# Patient Record
Sex: Female | Born: 1935 | Race: White | Hispanic: No | Smoking: Never smoker
Health system: Southern US, Community
[De-identification: ages and names within clinical notes are randomized; demographics above are authoritative.]

## PROBLEM LIST (undated history)

## (undated) DIAGNOSIS — C801 Malignant (primary) neoplasm, unspecified: Secondary | ICD-10-CM

## (undated) DIAGNOSIS — E119 Type 2 diabetes mellitus without complications: Secondary | ICD-10-CM

## (undated) DIAGNOSIS — H269 Unspecified cataract: Secondary | ICD-10-CM

## (undated) DIAGNOSIS — F419 Anxiety disorder, unspecified: Secondary | ICD-10-CM

## (undated) DIAGNOSIS — N200 Calculus of kidney: Secondary | ICD-10-CM

## (undated) DIAGNOSIS — E559 Vitamin D deficiency, unspecified: Secondary | ICD-10-CM

## (undated) DIAGNOSIS — G709 Myoneural disorder, unspecified: Secondary | ICD-10-CM

## (undated) DIAGNOSIS — M8580 Other specified disorders of bone density and structure, unspecified site: Secondary | ICD-10-CM

## (undated) DIAGNOSIS — K219 Gastro-esophageal reflux disease without esophagitis: Secondary | ICD-10-CM

## (undated) DIAGNOSIS — R42 Dizziness and giddiness: Secondary | ICD-10-CM

## (undated) DIAGNOSIS — I10 Essential (primary) hypertension: Secondary | ICD-10-CM

## (undated) DIAGNOSIS — N183 Chronic kidney disease, stage 3 unspecified: Secondary | ICD-10-CM

## (undated) DIAGNOSIS — G20A1 Parkinson's disease without dyskinesia, without mention of fluctuations: Secondary | ICD-10-CM

## (undated) DIAGNOSIS — E7800 Pure hypercholesterolemia, unspecified: Secondary | ICD-10-CM

## (undated) HISTORY — DX: Vitamin D deficiency, unspecified: E55.9

## (undated) HISTORY — DX: Unspecified cataract: H26.9

## (undated) HISTORY — DX: Other specified disorders of bone density and structure, unspecified site: M85.80

## (undated) HISTORY — DX: Pure hypercholesterolemia, unspecified: E78.00

## (undated) HISTORY — DX: Calculus of kidney: N20.0

## (undated) HISTORY — DX: Essential (primary) hypertension: I10

## (undated) HISTORY — PX: OTHER SURGICAL HISTORY: SHX169

## (undated) HISTORY — DX: Gastro-esophageal reflux disease without esophagitis: K21.9

## (undated) HISTORY — DX: Myoneural disorder, unspecified: G70.9

## (undated) HISTORY — DX: Anxiety disorder, unspecified: F41.9

---

## 2004-07-30 ENCOUNTER — Ambulatory Visit: Payer: Self-pay | Admitting: Internal Medicine

## 2005-07-27 ENCOUNTER — Ambulatory Visit: Payer: Self-pay | Admitting: Internal Medicine

## 2005-08-03 ENCOUNTER — Ambulatory Visit: Payer: Self-pay | Admitting: Internal Medicine

## 2006-08-04 ENCOUNTER — Ambulatory Visit: Payer: Self-pay | Admitting: Internal Medicine

## 2007-06-17 ENCOUNTER — Ambulatory Visit: Payer: Self-pay | Admitting: Gastroenterology

## 2007-09-07 ENCOUNTER — Ambulatory Visit: Payer: Self-pay | Admitting: Internal Medicine

## 2008-09-11 ENCOUNTER — Ambulatory Visit: Payer: Self-pay | Admitting: Internal Medicine

## 2009-09-16 ENCOUNTER — Ambulatory Visit: Payer: Self-pay | Admitting: Internal Medicine

## 2010-09-17 ENCOUNTER — Ambulatory Visit: Payer: Self-pay | Admitting: Internal Medicine

## 2011-09-21 ENCOUNTER — Ambulatory Visit: Payer: Self-pay | Admitting: Internal Medicine

## 2011-10-27 DIAGNOSIS — R7989 Other specified abnormal findings of blood chemistry: Secondary | ICD-10-CM | POA: Diagnosis not present

## 2011-10-27 DIAGNOSIS — E559 Vitamin D deficiency, unspecified: Secondary | ICD-10-CM | POA: Diagnosis not present

## 2011-10-27 DIAGNOSIS — I10 Essential (primary) hypertension: Secondary | ICD-10-CM | POA: Diagnosis not present

## 2011-10-27 DIAGNOSIS — E7800 Pure hypercholesterolemia, unspecified: Secondary | ICD-10-CM | POA: Diagnosis not present

## 2011-10-27 DIAGNOSIS — N390 Urinary tract infection, site not specified: Secondary | ICD-10-CM | POA: Diagnosis not present

## 2011-11-10 DIAGNOSIS — R7309 Other abnormal glucose: Secondary | ICD-10-CM | POA: Diagnosis not present

## 2011-11-10 DIAGNOSIS — E559 Vitamin D deficiency, unspecified: Secondary | ICD-10-CM | POA: Diagnosis not present

## 2011-11-10 DIAGNOSIS — Z124 Encounter for screening for malignant neoplasm of cervix: Secondary | ICD-10-CM | POA: Diagnosis not present

## 2011-11-10 DIAGNOSIS — E7800 Pure hypercholesterolemia, unspecified: Secondary | ICD-10-CM | POA: Diagnosis not present

## 2011-11-10 DIAGNOSIS — I10 Essential (primary) hypertension: Secondary | ICD-10-CM | POA: Diagnosis not present

## 2011-11-19 DIAGNOSIS — M899 Disorder of bone, unspecified: Secondary | ICD-10-CM | POA: Diagnosis not present

## 2011-11-19 DIAGNOSIS — M949 Disorder of cartilage, unspecified: Secondary | ICD-10-CM | POA: Diagnosis not present

## 2012-07-25 DIAGNOSIS — Z23 Encounter for immunization: Secondary | ICD-10-CM | POA: Diagnosis not present

## 2012-08-19 ENCOUNTER — Telehealth: Payer: Self-pay | Admitting: Internal Medicine

## 2012-08-19 DIAGNOSIS — Z139 Encounter for screening, unspecified: Secondary | ICD-10-CM

## 2012-08-19 NOTE — Telephone Encounter (Signed)
i placed order for mammo.  i am not sure when she had her last.  i have not seen her here yet.  Thanks.

## 2012-08-19 NOTE — Telephone Encounter (Signed)
Patient needing an order for a screening mammogram.

## 2012-08-22 ENCOUNTER — Telehealth: Payer: Self-pay | Admitting: Internal Medicine

## 2012-08-22 NOTE — Telephone Encounter (Addendum)
Refill on Amlodipine 5 mg tab takes 2 a day [facility]

## 2012-08-23 MED ORDER — AMLODIPINE BESYLATE 5 MG PO TABS: 5.0000 mg | ORAL_TABLET | Freq: Two times a day (BID) | ORAL | Status: DC

## 2012-08-23 NOTE — Telephone Encounter (Signed)
Sent in to pharmacy.  

## 2012-09-29 ENCOUNTER — Ambulatory Visit: Payer: Self-pay | Admitting: Internal Medicine

## 2012-09-29 DIAGNOSIS — Z1231 Encounter for screening mammogram for malignant neoplasm of breast: Secondary | ICD-10-CM | POA: Diagnosis not present

## 2012-09-29 LAB — HM MAMMOGRAPHY

## 2012-10-10 ENCOUNTER — Telehealth: Payer: Self-pay | Admitting: Internal Medicine

## 2012-10-10 NOTE — Telephone Encounter (Signed)
Need a refill on her  Amlodipine 5 mg , patient takes this medication twice a day and she is running out , she stated that she must have gotten just 30 pills.

## 2012-10-12 NOTE — Telephone Encounter (Signed)
Called patient no answer.

## 2012-10-14 NOTE — Telephone Encounter (Signed)
Patients script corrected and she has an appointment for Monday 13,2014

## 2012-10-14 NOTE — Telephone Encounter (Signed)
Called patient no answer.

## 2012-10-16 ENCOUNTER — Encounter: Payer: Self-pay | Admitting: Internal Medicine

## 2012-10-16 DIAGNOSIS — I10 Essential (primary) hypertension: Secondary | ICD-10-CM | POA: Insufficient documentation

## 2012-10-16 DIAGNOSIS — E7800 Pure hypercholesterolemia, unspecified: Secondary | ICD-10-CM | POA: Insufficient documentation

## 2012-10-16 DIAGNOSIS — K5790 Diverticulosis of intestine, part unspecified, without perforation or abscess without bleeding: Secondary | ICD-10-CM

## 2012-10-16 DIAGNOSIS — M8580 Other specified disorders of bone density and structure, unspecified site: Secondary | ICD-10-CM | POA: Insufficient documentation

## 2012-10-17 ENCOUNTER — Ambulatory Visit (INDEPENDENT_AMBULATORY_CARE_PROVIDER_SITE_OTHER): Payer: Medicare Other | Admitting: Internal Medicine

## 2012-10-17 ENCOUNTER — Encounter: Payer: Self-pay | Admitting: Internal Medicine

## 2012-10-17 VITALS — BP 127/75 | HR 55 | Temp 97.5°F | Ht 62.0 in | Wt 144.0 lb

## 2012-10-17 DIAGNOSIS — M8580 Other specified disorders of bone density and structure, unspecified site: Secondary | ICD-10-CM

## 2012-10-17 DIAGNOSIS — E559 Vitamin D deficiency, unspecified: Secondary | ICD-10-CM | POA: Diagnosis not present

## 2012-10-17 DIAGNOSIS — E7800 Pure hypercholesterolemia, unspecified: Secondary | ICD-10-CM | POA: Diagnosis not present

## 2012-10-17 DIAGNOSIS — M899 Disorder of bone, unspecified: Secondary | ICD-10-CM | POA: Diagnosis not present

## 2012-10-17 DIAGNOSIS — I10 Essential (primary) hypertension: Secondary | ICD-10-CM | POA: Diagnosis not present

## 2012-10-17 DIAGNOSIS — R7309 Other abnormal glucose: Secondary | ICD-10-CM | POA: Diagnosis not present

## 2012-10-17 DIAGNOSIS — R5381 Other malaise: Secondary | ICD-10-CM | POA: Diagnosis not present

## 2012-10-17 DIAGNOSIS — R739 Hyperglycemia, unspecified: Secondary | ICD-10-CM

## 2012-10-17 DIAGNOSIS — R5383 Other fatigue: Secondary | ICD-10-CM

## 2012-10-17 DIAGNOSIS — M949 Disorder of cartilage, unspecified: Secondary | ICD-10-CM | POA: Diagnosis not present

## 2012-10-17 LAB — CBC WITH DIFFERENTIAL/PLATELET
Basophils Relative: 0.7 % (ref 0.0–3.0)
Eosinophils Relative: 6.3 % — ABNORMAL HIGH (ref 0.0–5.0)
Lymphocytes Relative: 33 % (ref 12.0–46.0)
MCV: 93.3 fl (ref 78.0–100.0)
Monocytes Absolute: 0.7 10*3/uL (ref 0.1–1.0)
Neutrophils Relative %: 50.8 % (ref 43.0–77.0)
Platelets: 231 10*3/uL (ref 150.0–400.0)
RBC: 4.41 Mil/uL (ref 3.87–5.11)
WBC: 7.3 10*3/uL (ref 4.5–10.5)

## 2012-10-17 LAB — COMPREHENSIVE METABOLIC PANEL
AST: 17 U/L (ref 0–37)
Albumin: 3.7 g/dL (ref 3.5–5.2)
Alkaline Phosphatase: 55 U/L (ref 39–117)
Calcium: 9 mg/dL (ref 8.4–10.5)
Chloride: 107 mEq/L (ref 96–112)
Glucose, Bld: 120 mg/dL — ABNORMAL HIGH (ref 70–99)
Potassium: 4.1 mEq/L (ref 3.5–5.1)
Sodium: 139 mEq/L (ref 135–145)
Total Protein: 7.1 g/dL (ref 6.0–8.3)

## 2012-10-17 LAB — HEMOGLOBIN A1C: Hgb A1c MFr Bld: 6.2 % (ref 4.6–6.5)

## 2012-10-17 LAB — TSH: TSH: 3.52 u[IU]/mL (ref 0.35–5.50)

## 2012-10-17 LAB — LIPID PANEL
LDL Cholesterol: 120 mg/dL — ABNORMAL HIGH (ref 0–99)
VLDL: 23.6 mg/dL (ref 0.0–40.0)

## 2012-10-17 MED ORDER — ESTROGENS CONJUGATED 0.3 MG PO TABS: 0.3000 mg | ORAL_TABLET | Freq: Every day | ORAL | Status: DC

## 2012-10-17 MED ORDER — AMLODIPINE BESYLATE 5 MG PO TABS: 5.0000 mg | ORAL_TABLET | Freq: Two times a day (BID) | ORAL | Status: DC

## 2012-10-17 MED ORDER — ATENOLOL 100 MG PO TABS: 100.0000 mg | ORAL_TABLET | Freq: Every day | ORAL | Status: DC

## 2012-10-17 MED ORDER — LORAZEPAM 0.5 MG PO TABS: ORAL_TABLET | ORAL | Status: DC

## 2012-10-17 MED ORDER — PROGESTERONE MICRONIZED 100 MG PO CAPS: 100.0000 mg | ORAL_CAPSULE | Freq: Every day | ORAL | Status: DC

## 2012-10-17 NOTE — Progress Notes (Signed)
  Subjective:    Patient ID: Kara Dixon, female    DOB: [birth year], 77 y.o.   MRN: [id number]  HPI 77 year old female with past history of hypertension and hypercholesterolemia who comes in today for a scheduled follow up.  States she got her flu shot.  Discussed pneumonia shot.  Will get it on return appt.  She is planning to go to the [place] this weekend.   Had questions about sea sickness.  She is trying to start tapering the estrogen.  She is taking it every day except Wednesday.  Plans to try to taper more.  She also was questioning - being lactose intolerant.   She is taking a dairy digestive supplement and using lactose free milk.  States she is doing better since making this change.  Has some arthritis.  Notices more in her fingers, left hand and right hip.   Past Medical History  Diagnosis Date  . Hypertension   . Hypercholesterolemia   . Nephrolithiasis   . Osteopenia   . Vitamin D deficiency     Current Outpatient Prescriptions on File Prior to Visit  Medication Sig Dispense Refill  . amLODipine (NORVASC) 5 MG tablet Take 1 tablet (5 mg total) by mouth 2 (two) times daily.  60 tablet  6  . aspirin 81 MG tablet Take 81 mg by mouth daily.      Marland Kitchen atenolol (TENORMIN) 100 MG tablet Take 1 tablet (100 mg total) by mouth daily.  30 tablet  6  . estrogens, conjugated, (PREMARIN) 0.3 MG tablet Take 1 tablet (0.3 mg total) by mouth daily.  30 tablet  5  . fexofenadine (ALLEGRA) 180 MG tablet Take 180 mg by mouth daily.      . progesterone (PROMETRIUM) 100 MG capsule Take 1 capsule (100 mg total) by mouth daily.  30 capsule  6    Review of Systems Patient denies any headache, lightheadedness or dizziness.  No sinus or allergy symptoms.  No chest pain, tightness or palpitations.  No increased shortness of breath, cough or congestion.  No nausea or vomiting.  No acid reflux. No abdominal pain or cramping.  No bowel change, such as diarrhea, constipation, BRBPR or melana.  No urine change.   Overall she feels she is doing well.      Objective:   Physical Exam Filed Vitals:   10/17/12 0826  BP: 127/75  Pulse: 55  Temp: 97.5 F (45.94 C)   76 year old female in no acute distress.   HEENT:  Nares - clear.  OP- without lesions or erythema.  NECK:  Supple, nontender.  No audible bruit.   HEART:  Appears to be regular. LUNGS:  Without crackles or wheezing audible.  Respirations even and unlabored.   RADIAL PULSE:  Equal bilaterally.  ABDOMEN:  Soft, nontender.  No audible abdominal bruit.   EXTREMITIES:  No increased edema to be present.                   Assessment & Plan:  QUESTION OF LACTOSE INTOLERANCE.  She is doing better on her current regimen.  Follow.    MSK.  Some "arthritis".  Will use tylenol arthritis.  Desires no further intervention.   GYN.  Tapering estrogen slowly.  Continue.    HEALTH MAINTENANCE.  Physical 11/10/11.  Mammogram 09/21/11 - BiRADS II.  Colonoscopy 06/20/07 normal.

## 2012-10-18 LAB — VITAMIN D 25 HYDROXY (VIT D DEFICIENCY, FRACTURES): Vit D, 25-Hydroxy: 25 ng/mL — ABNORMAL LOW (ref 30–89)

## 2012-10-19 ENCOUNTER — Other Ambulatory Visit: Payer: Self-pay | Admitting: Internal Medicine

## 2012-10-19 ENCOUNTER — Telehealth: Payer: Self-pay | Admitting: Internal Medicine

## 2012-10-19 NOTE — Telephone Encounter (Signed)
Vitamin d added to med list

## 2012-10-19 NOTE — Progress Notes (Signed)
Opened in error

## 2012-10-23 ENCOUNTER — Encounter: Payer: Self-pay | Admitting: Internal Medicine

## 2012-10-23 NOTE — Assessment & Plan Note (Signed)
On estrogen.  Continue calcium and vitamin D.  Check vitamin D.

## 2012-10-23 NOTE — Assessment & Plan Note (Signed)
Low cholesterol diet and exercise.  Check lipid panel.   

## 2012-10-23 NOTE — Assessment & Plan Note (Signed)
Check vitamin D level 

## 2012-10-23 NOTE — Assessment & Plan Note (Signed)
Blood pressure under good control.  Follow metabolic panel.   

## 2012-10-29 ENCOUNTER — Other Ambulatory Visit: Payer: Self-pay | Admitting: Internal Medicine

## 2012-10-31 NOTE — Telephone Encounter (Signed)
Sent in to pharmacy.  

## 2012-11-10 ENCOUNTER — Telehealth: Payer: Self-pay | Admitting: Internal Medicine

## 2012-11-10 NOTE — Telephone Encounter (Signed)
Pt states she is returning a call.  Pt refuses to leave msg on voicemail.  Pt states she does not need to speak to a nurse, she needs to speak to the doctor because she needs Rx and a nurse can't write her Rx... Please call pt.

## 2012-11-10 NOTE — Telephone Encounter (Signed)
Need info

## 2012-11-10 NOTE — Telephone Encounter (Signed)
Patient wanting a call back about a prescription change.

## 2012-11-10 NOTE — Telephone Encounter (Signed)
Patient stated that she went to get script filled ([facility]), [facility] is now $94.00. She called insurance co they told her that she can switch to [place] with a cop-pay of $43.00 at [facility] or estropipate with co-pay of $7.00 at [facility], [facility]. Patient wanted to know if either one of the other meds would work, especially estropipate? Patient stated that she only has one pill left.

## 2012-11-11 NOTE — Telephone Encounter (Signed)
I do not usually use this form of estrogen.  To my knowledge there is not an estrogen that is a generic equivalent to her premarin.

## 2012-11-11 NOTE — Telephone Encounter (Signed)
Called patient back no answer, will try again later.

## 2012-11-14 NOTE — Telephone Encounter (Signed)
Patient called back and was given information.

## 2013-01-05 ENCOUNTER — Encounter: Payer: Self-pay | Admitting: Internal Medicine

## 2013-01-05 ENCOUNTER — Ambulatory Visit (INDEPENDENT_AMBULATORY_CARE_PROVIDER_SITE_OTHER): Payer: Medicare Other | Admitting: Internal Medicine

## 2013-01-05 ENCOUNTER — Other Ambulatory Visit (HOSPITAL_COMMUNITY)
Admission: RE | Admit: 2013-01-05 | Discharge: 2013-01-05 | Disposition: A | Discharge status: Home / Self Care | Payer: Medicare Other | Source: Ambulatory Visit | Attending: Internal Medicine | Admitting: Internal Medicine

## 2013-01-05 VITALS — BP 140/64 | HR 57 | Temp 97.9°F | Ht 62.0 in | Wt 143.5 lb

## 2013-01-05 DIAGNOSIS — M8580 Other specified disorders of bone density and structure, unspecified site: Secondary | ICD-10-CM

## 2013-01-05 DIAGNOSIS — Z1151 Encounter for screening for human papillomavirus (HPV): Secondary | ICD-10-CM | POA: Insufficient documentation

## 2013-01-05 DIAGNOSIS — R195 Other fecal abnormalities: Secondary | ICD-10-CM

## 2013-01-05 DIAGNOSIS — E7800 Pure hypercholesterolemia, unspecified: Secondary | ICD-10-CM

## 2013-01-05 DIAGNOSIS — I10 Essential (primary) hypertension: Secondary | ICD-10-CM | POA: Diagnosis not present

## 2013-01-05 DIAGNOSIS — R7309 Other abnormal glucose: Secondary | ICD-10-CM

## 2013-01-05 DIAGNOSIS — M899 Disorder of bone, unspecified: Secondary | ICD-10-CM

## 2013-01-05 DIAGNOSIS — Z124 Encounter for screening for malignant neoplasm of cervix: Secondary | ICD-10-CM | POA: Insufficient documentation

## 2013-01-05 DIAGNOSIS — Z1211 Encounter for screening for malignant neoplasm of colon: Secondary | ICD-10-CM

## 2013-01-05 DIAGNOSIS — E559 Vitamin D deficiency, unspecified: Secondary | ICD-10-CM | POA: Diagnosis not present

## 2013-01-05 DIAGNOSIS — R85611 Atypical squamous cells cannot exclude high grade squamous intraepithelial lesion on cytologic smear of anus (ASC-H): Secondary | ICD-10-CM

## 2013-01-05 DIAGNOSIS — R739 Hyperglycemia, unspecified: Secondary | ICD-10-CM

## 2013-01-08 ENCOUNTER — Encounter: Payer: Self-pay | Admitting: Internal Medicine

## 2013-01-08 DIAGNOSIS — E119 Type 2 diabetes mellitus without complications: Secondary | ICD-10-CM | POA: Insufficient documentation

## 2013-01-08 NOTE — Assessment & Plan Note (Signed)
Low cholesterol diet and exercise.  Check lipid panel.   

## 2013-01-08 NOTE — Assessment & Plan Note (Signed)
Blood pressure under good control.  Follow metabolic panel.   

## 2013-01-08 NOTE — Assessment & Plan Note (Signed)
Low carb diet.  A1c checked 1/14 - 6.2.  Follow.

## 2013-01-08 NOTE — Assessment & Plan Note (Signed)
Check vitamin D level 

## 2013-01-08 NOTE — Progress Notes (Signed)
Subjective:    Patient ID: Kara Dixon, female    DOB: [birth year], 77 y.o.   MRN: [id number]  HPI 77 year old female with past history of hypertension and hypercholesterolemia who comes in today to follow up on these issues as well as for a complete physical exam.  She is trying to taper the estrogen.  She is taking it every other day now.  She also feels she may be lactose intolerant.   She is taking a dairy digestive supplement and using lactose free milk.  States she is doing better since making this change.  Is having some night sweats, but manageable.  She has noticed a little bright red blood on the tissue.   No straining.     Past Medical History  Diagnosis Date  . Hypertension   . Hypercholesterolemia   . Nephrolithiasis   . Osteopenia   . Vitamin D deficiency     Current Outpatient Prescriptions on File Prior to Visit  Medication Sig Dispense Refill  . amLODipine (NORVASC) 5 MG tablet Take 1 tablet (5 mg total) by mouth 2 (two) times daily.  60 tablet  6  . aspirin 81 MG tablet Take 81 mg by mouth daily.      Marland Kitchen atenolol (TENORMIN) 100 MG tablet TAKE 1 TABLET BY MOUTH ONCE A DAY  30 tablet  5  . estrogens, conjugated, (PREMARIN) 0.3 MG tablet Take 1 tablet (0.3 mg total) by mouth daily.  30 tablet  5  . fexofenadine (ALLEGRA) 180 MG tablet Take 180 mg by mouth daily.      Marland Kitchen LORazepam (ATIVAN) 0.5 MG tablet 1/2 tablet q hs prn  30 tablet  0  . progesterone (PROMETRIUM) 100 MG capsule Take 1 capsule (100 mg total) by mouth daily.  30 capsule  6  . Cholecalciferol (VITAMIN D-3) 1000 UNITS CAPS Take 2 capsules by mouth daily.       No current facility-administered medications on file prior to visit.    Review of Systems Patient denies any headache, lightheadedness or dizziness.  No sinus or allergy symptoms.  No chest pain, tightness or palpitations.  No increased shortness of breath, cough or congestion.  No nausea or vomiting.  No acid reflux. No abdominal pain or cramping.   No bowel change, such as diarrhea, constipation, or melana.  Has noticed a little BRB on the tissue at times - with wiping.  No urine change.  Overall she feels she is doing well.  Handling stress well.       Objective:   Physical Exam  Filed Vitals:   01/05/13 0828  BP: 140/64  Pulse: 57  Temp: 97.9 F (36.6 C)   Blood pressure recheck:  124/70, pulse 34  77 year old female in no acute distress.   HEENT:  Nares- clear.  Oropharynx - without lesions. NECK:  Supple.  Nontender.  No audible bruit.  HEART:  Appears to be regular. LUNGS:  No crackles or wheezing audible.  Respirations even and unlabored.  RADIAL PULSE:  Equal bilaterally.    BREASTS:  No nipple discharge or nipple retraction present.  Could not appreciate any distinct nodules or axillary adenopathy.  ABDOMEN:  Soft, nontender.  Bowel sounds present and normal.  No audible abdominal bruit.  GU:  Normal external genitalia.  Vaginal vault without lesions.  Cervix identified.  Pap performed. Could not appreciate any adnexal masses or tenderness.   RECTAL:  Heme positive.  EXTREMITIES:  No increased edema present.  DP  pulses palpable and equal bilaterally.          Assessment & Plan:  QUESTION OF LACTOSE INTOLERANCE.  She is doing better on her current regimen.  Follow.    GYN.  Tapering estrogen slowly.  Continue.    HEME POSITIVE.  Last colonoscopy 2008.  BRB as outlined.  Heme positive today.  Will refer back to GI for question of repeat colonoscopy.  Consider annusol HC suppositories.    HEALTH MAINTENANCE.  Physical today.  Mammogram 09/29/12 - BiRADS II.  Colonoscopy 06/20/07 normal.

## 2013-01-08 NOTE — Assessment & Plan Note (Signed)
On estrogen.  Continue calcium and vitamin D.  Check vitamin D.

## 2013-01-10 ENCOUNTER — Encounter: Payer: Self-pay | Admitting: Internal Medicine

## 2013-02-06 DIAGNOSIS — R195 Other fecal abnormalities: Secondary | ICD-10-CM | POA: Diagnosis not present

## 2013-03-02 ENCOUNTER — Ambulatory Visit: Payer: Self-pay | Admitting: Gastroenterology

## 2013-03-02 DIAGNOSIS — Z7982 Long term (current) use of aspirin: Secondary | ICD-10-CM | POA: Diagnosis not present

## 2013-03-02 DIAGNOSIS — E7800 Pure hypercholesterolemia, unspecified: Secondary | ICD-10-CM | POA: Diagnosis not present

## 2013-03-02 DIAGNOSIS — K644 Residual hemorrhoidal skin tags: Secondary | ICD-10-CM | POA: Diagnosis not present

## 2013-03-02 DIAGNOSIS — I10 Essential (primary) hypertension: Secondary | ICD-10-CM | POA: Diagnosis not present

## 2013-03-02 DIAGNOSIS — Z882 Allergy status to sulfonamides status: Secondary | ICD-10-CM | POA: Diagnosis not present

## 2013-03-02 DIAGNOSIS — Z888 Allergy status to other drugs, medicaments and biological substances status: Secondary | ICD-10-CM | POA: Diagnosis not present

## 2013-03-02 DIAGNOSIS — K649 Unspecified hemorrhoids: Secondary | ICD-10-CM | POA: Diagnosis not present

## 2013-03-02 DIAGNOSIS — Z79899 Other long term (current) drug therapy: Secondary | ICD-10-CM | POA: Diagnosis not present

## 2013-03-02 DIAGNOSIS — E559 Vitamin D deficiency, unspecified: Secondary | ICD-10-CM | POA: Diagnosis not present

## 2013-03-02 DIAGNOSIS — Z880 Allergy status to penicillin: Secondary | ICD-10-CM | POA: Diagnosis not present

## 2013-03-02 DIAGNOSIS — K5730 Diverticulosis of large intestine without perforation or abscess without bleeding: Secondary | ICD-10-CM | POA: Diagnosis not present

## 2013-03-02 DIAGNOSIS — E785 Hyperlipidemia, unspecified: Secondary | ICD-10-CM | POA: Diagnosis not present

## 2013-03-02 DIAGNOSIS — R195 Other fecal abnormalities: Secondary | ICD-10-CM | POA: Diagnosis not present

## 2013-03-16 DIAGNOSIS — K5790 Diverticulosis of intestine, part unspecified, without perforation or abscess without bleeding: Secondary | ICD-10-CM | POA: Insufficient documentation

## 2013-04-20 ENCOUNTER — Encounter: Payer: Self-pay | Admitting: Internal Medicine

## 2013-05-06 ENCOUNTER — Other Ambulatory Visit: Payer: Self-pay | Admitting: Internal Medicine

## 2013-05-12 ENCOUNTER — Ambulatory Visit (INDEPENDENT_AMBULATORY_CARE_PROVIDER_SITE_OTHER): Payer: Medicare Other | Admitting: Internal Medicine

## 2013-05-12 ENCOUNTER — Encounter: Payer: Self-pay | Admitting: Internal Medicine

## 2013-05-12 VITALS — BP 110/72 | HR 56 | Temp 97.9°F | Ht 62.0 in | Wt 143.5 lb

## 2013-05-12 DIAGNOSIS — R7309 Other abnormal glucose: Secondary | ICD-10-CM

## 2013-05-12 DIAGNOSIS — E7800 Pure hypercholesterolemia, unspecified: Secondary | ICD-10-CM

## 2013-05-12 DIAGNOSIS — K5790 Diverticulosis of intestine, part unspecified, without perforation or abscess without bleeding: Secondary | ICD-10-CM

## 2013-05-12 DIAGNOSIS — I10 Essential (primary) hypertension: Secondary | ICD-10-CM

## 2013-05-12 DIAGNOSIS — M899 Disorder of bone, unspecified: Secondary | ICD-10-CM

## 2013-05-12 DIAGNOSIS — M8580 Other specified disorders of bone density and structure, unspecified site: Secondary | ICD-10-CM

## 2013-05-12 DIAGNOSIS — E559 Vitamin D deficiency, unspecified: Secondary | ICD-10-CM

## 2013-05-12 DIAGNOSIS — R195 Other fecal abnormalities: Secondary | ICD-10-CM

## 2013-05-12 DIAGNOSIS — R739 Hyperglycemia, unspecified: Secondary | ICD-10-CM

## 2013-05-12 DIAGNOSIS — K5730 Diverticulosis of large intestine without perforation or abscess without bleeding: Secondary | ICD-10-CM

## 2013-05-12 LAB — BASIC METABOLIC PANEL
BUN: 12 mg/dL (ref 6–23)
CO2: 25 mEq/L (ref 19–32)
Chloride: 104 mEq/L (ref 96–112)
Potassium: 4.4 mEq/L (ref 3.5–5.1)

## 2013-05-12 LAB — CBC WITH DIFFERENTIAL/PLATELET
Basophils Absolute: 0.1 10*3/uL (ref 0.0–0.1)
HCT: 38.9 % (ref 36.0–46.0)
Hemoglobin: 13 g/dL (ref 12.0–15.0)
Lymphs Abs: 2.9 10*3/uL (ref 0.7–4.0)
MCHC: 33.5 g/dL (ref 30.0–36.0)
MCV: 88.3 fl (ref 78.0–100.0)
Monocytes Absolute: 0.7 10*3/uL (ref 0.1–1.0)
Monocytes Relative: 7.6 % (ref 3.0–12.0)
Neutro Abs: 4.6 10*3/uL (ref 1.4–7.7)
Platelets: 276 10*3/uL (ref 150.0–400.0)
RDW: 15 % — ABNORMAL HIGH (ref 11.5–14.6)

## 2013-05-12 LAB — HEMOGLOBIN A1C: Hgb A1c MFr Bld: 6.6 % — ABNORMAL HIGH (ref 4.6–6.5)

## 2013-05-12 LAB — LDL CHOLESTEROL, DIRECT: Direct LDL: 139.4 mg/dL

## 2013-05-12 LAB — LIPID PANEL: VLDL: 29.4 mg/dL (ref 0.0–40.0)

## 2013-05-12 MED ORDER — TOBRAMYCIN-DEXAMETHASONE 0.3-0.1 % OP SUSP: 1.0000 [drp] | Freq: Four times a day (QID) | OPHTHALMIC | Status: DC

## 2013-05-12 MED ORDER — LORAZEPAM 0.5 MG PO TABS: ORAL_TABLET | ORAL | Status: DC

## 2013-05-14 ENCOUNTER — Encounter: Payer: Self-pay | Admitting: Internal Medicine

## 2013-05-14 ENCOUNTER — Other Ambulatory Visit: Payer: Self-pay | Admitting: Internal Medicine

## 2013-05-14 DIAGNOSIS — H5789 Other specified disorders of eye and adnexa: Secondary | ICD-10-CM

## 2013-05-14 NOTE — Progress Notes (Signed)
Subjective:    Patient ID: Kara Dixon, female    DOB: 1936-01-02, 77 y.o.   MRN: [id number]  HPI 77 year old female with past history of hypertension and hypercholesterolemia who comes in today to for a scheduled follow up.  She came off her estrogen.  Noticed not sleeping as well.  Increased nervousness.  Just did not feel as well.  Restarted her estrogen this past week.  Starting to feel better. Has tried to come off previously.  Did not tolerate.  Desires to remain on her estrogen.  She also noticed some redness and a sty in her right eye.  Is some better, but has been persistent.  Has seen Dr Inez Pilgrim previously.  Overdue follow up.  Breathing stable.  Stays active.      Past Medical History  Diagnosis Date  . Hypertension   . Hypercholesterolemia   . Nephrolithiasis   . Osteopenia   . Vitamin D deficiency     Current Outpatient Prescriptions on File Prior to Visit  Medication Sig Dispense Refill  . amLODipine (NORVASC) 5 MG tablet Take 1 tablet (5 mg total) by mouth 2 (two) times daily.  60 tablet  6  . aspirin 81 MG tablet Take 81 mg by mouth daily.      Marland Kitchen atenolol (TENORMIN) 100 MG tablet TAKE 1 TABLET BY MOUTH ONCE A DAY  30 tablet  5  . Cholecalciferol (VITAMIN D-3) 1000 UNITS CAPS Take 2 capsules by mouth daily.      Marland Kitchen estrogens, conjugated, (PREMARIN) 0.3 MG tablet Take 1 tablet (0.3 mg total) by mouth daily.  30 tablet  5  . fexofenadine (ALLEGRA) 180 MG tablet Take 180 mg by mouth daily.      . progesterone (PROMETRIUM) 100 MG capsule Take 1 capsule (100 mg total) by mouth daily.  30 capsule  6   No current facility-administered medications on file prior to visit.    Review of Systems Patient denies any headache, lightheadedness or dizziness.  Eye issues as outlined.  No sinus or allergy symptoms.  No chest pain, tightness or palpitations.  No increased shortness of breath, cough or congestion.  No nausea or vomiting.  No acid reflux. No abdominal pain or cramping.   No bowel change, such as diarrhea, constipation, or melana.  No urine change.   Handling stress well.  Back on her estrogen.  Did not feel well off.       Objective:   Physical Exam  Filed Vitals:   05/12/13 1014  BP: 110/72  Pulse: 56  Temp: 97.9 F (30.38 C)   77 year old female in no acute distress.   HEENT:  Nares- clear.  Oropharynx - without lesions.  Eyes- small lesion lower lid with some redness.   NECK:  Supple.  Nontender.  No audible bruit.  HEART:  Appears to be regular. LUNGS:  No crackles or wheezing audible.  Respirations even and unlabored.  RADIAL PULSE:  Equal bilaterally.   ABDOMEN:  Soft, nontender.  Bowel sounds present and normal.  No audible abdominal bruit.   EXTREMITIES:  No increased edema present.  DP pulses palpable and equal bilaterally.          Assessment & Plan:  OPTHALMOLOGY.  Eye lesion and exam as outlined.  Tobradex as directed.  Refer to opthalmology for evaluation.    GYN.   Back on her estrogen.  Did not tolerate coming off.  Wants to remain on estrogen.  Follow.   HEME  POSITIVE.  Last colonoscopy 2008.  BRB as outlined last note.  Was referred back to GI for question of repeat colonoscopy.     HEALTH MAINTENANCE.  Physical last visit.  Mammogram 09/29/12 - BiRADS II.  Colonoscopy 03/02/13 revealed external hemorrhoids and diverticulosis.

## 2013-05-14 NOTE — Assessment & Plan Note (Signed)
Vitamin D level 1/14 - 25.  Continue vitamin D supplementation.

## 2013-05-14 NOTE — Assessment & Plan Note (Signed)
On estrogen.  Continue calcium and vitamin D.  Follow vitamin D level.     

## 2013-05-14 NOTE — Progress Notes (Signed)
Order placed for opthalmology referral 

## 2013-05-14 NOTE — Assessment & Plan Note (Signed)
No further bleeding reported.  Follow.

## 2013-05-14 NOTE — Assessment & Plan Note (Signed)
Blood pressure under good control.  Follow metabolic panel.   

## 2013-05-14 NOTE — Assessment & Plan Note (Signed)
Low cholesterol diet and exercise.  Follow lipid panel.   

## 2013-05-14 NOTE — Assessment & Plan Note (Signed)
Low carb diet and exercise.  Follow met b and a1c.  

## 2013-05-15 ENCOUNTER — Encounter: Payer: Self-pay | Admitting: *Deleted

## 2013-05-16 DIAGNOSIS — H2510 Age-related nuclear cataract, unspecified eye: Secondary | ICD-10-CM | POA: Diagnosis not present

## 2013-05-25 ENCOUNTER — Other Ambulatory Visit: Payer: Self-pay | Admitting: Internal Medicine

## 2013-06-06 DIAGNOSIS — H2510 Age-related nuclear cataract, unspecified eye: Secondary | ICD-10-CM | POA: Diagnosis not present

## 2013-07-11 DIAGNOSIS — Z23 Encounter for immunization: Secondary | ICD-10-CM | POA: Diagnosis not present

## 2013-08-14 DIAGNOSIS — D2310 Other benign neoplasm of skin of unspecified eyelid, including canthus: Secondary | ICD-10-CM | POA: Diagnosis not present

## 2013-08-15 DIAGNOSIS — D2310 Other benign neoplasm of skin of unspecified eyelid, including canthus: Secondary | ICD-10-CM | POA: Diagnosis not present

## 2013-09-13 ENCOUNTER — Ambulatory Visit: Payer: Medicare Other | Admitting: Internal Medicine

## 2013-09-15 ENCOUNTER — Ambulatory Visit (INDEPENDENT_AMBULATORY_CARE_PROVIDER_SITE_OTHER): Payer: Medicare Other | Admitting: Internal Medicine

## 2013-09-15 ENCOUNTER — Encounter: Payer: Self-pay | Admitting: Internal Medicine

## 2013-09-15 VITALS — BP 110/80 | HR 53 | Temp 97.7°F | Ht 62.0 in | Wt 144.5 lb

## 2013-09-15 DIAGNOSIS — R7309 Other abnormal glucose: Secondary | ICD-10-CM

## 2013-09-15 DIAGNOSIS — M899 Disorder of bone, unspecified: Secondary | ICD-10-CM

## 2013-09-15 DIAGNOSIS — E559 Vitamin D deficiency, unspecified: Secondary | ICD-10-CM

## 2013-09-15 DIAGNOSIS — K5790 Diverticulosis of intestine, part unspecified, without perforation or abscess without bleeding: Secondary | ICD-10-CM

## 2013-09-15 DIAGNOSIS — R5381 Other malaise: Secondary | ICD-10-CM

## 2013-09-15 DIAGNOSIS — R5383 Other fatigue: Secondary | ICD-10-CM

## 2013-09-15 DIAGNOSIS — E7800 Pure hypercholesterolemia, unspecified: Secondary | ICD-10-CM

## 2013-09-15 DIAGNOSIS — K5730 Diverticulosis of large intestine without perforation or abscess without bleeding: Secondary | ICD-10-CM

## 2013-09-15 DIAGNOSIS — I10 Essential (primary) hypertension: Secondary | ICD-10-CM

## 2013-09-15 DIAGNOSIS — R739 Hyperglycemia, unspecified: Secondary | ICD-10-CM

## 2013-09-15 DIAGNOSIS — M8580 Other specified disorders of bone density and structure, unspecified site: Secondary | ICD-10-CM

## 2013-09-15 NOTE — Progress Notes (Signed)
Pre-visit discussion using our clinic review tool. No additional management support is needed unless otherwise documented below in the visit note.  

## 2013-09-15 NOTE — Progress Notes (Signed)
Subjective:    Patient ID: Kara Dixon, female    DOB: 06/04/1936, 77 y.o.   MRN: [id number]  HPI 77 year old female with past history of hypertension and hypercholesterolemia who comes in today to for a scheduled follow up.  She came off her estrogen previously.   Noticed not sleeping as well.  Increased nervousness.  Just did not feel as well.  Restarted her estrogen just before her last visit.  Feels better.  Wants to stay on her etrogen.   Breathing stable.  Stays active.   No cardiac symptoms with increased activity or exertion.  Blood pressure doing well.     Past Medical History  Diagnosis Date  . Hypertension   . Hypercholesterolemia   . Nephrolithiasis   . Osteopenia   . Vitamin D deficiency     Current Outpatient Prescriptions on File Prior to Visit  Medication Sig Dispense Refill  . amLODipine (NORVASC) 5 MG tablet TAKE 1 TABLET BY MOUTH 2 TIMES DAILY.  60 tablet  5  . aspirin 81 MG tablet Take 81 mg by mouth daily.      Marland Kitchen atenolol (TENORMIN) 100 MG tablet TAKE 1 TABLET BY MOUTH ONCE A DAY  30 tablet  5  . estrogens, conjugated, (PREMARIN) 0.3 MG tablet Take 1 tablet (0.3 mg total) by mouth daily.  30 tablet  5  . fexofenadine (ALLEGRA) 180 MG tablet Take 180 mg by mouth daily.      Marland Kitchen lactase (LACTAID) 3000 UNITS tablet Take 1 tablet by mouth as needed.      Marland Kitchen LORazepam (ATIVAN) 0.5 MG tablet 1/2 tablet q hs prn  30 tablet  0  . progesterone (PROMETRIUM) 100 MG capsule Take 1 capsule (100 mg total) by mouth daily.  30 capsule  6  . tobramycin-dexamethasone (TOBRADEX) ophthalmic solution Place 1 drop into both eyes every 6 (six) hours.  5 mL  0   No current facility-administered medications on file prior to visit.    Review of Systems Patient denies any headache, lightheadedness or dizziness.   No sinus or allergy symptoms.  No chest pain, tightness or palpitations.  No increased shortness of breath, cough or congestion.  No nausea or vomiting.  No acid reflux. No  abdominal pain or cramping.  No bowel change, such as diarrhea, constipation, or melana.  No urine change.   Handling stress well.   Back on her estrogen.  Did not feel well off.       Objective:   Physical Exam  Filed Vitals:   09/15/13 1419  BP: 110/80  Pulse: 53  Temp: 97.7 F (36.5 C)   Blood pressure recheck:  40/42   77 year old female in no acute distress.   HEENT:  Nares- clear.  Oropharynx - without lesions.     NECK:  Supple.  Nontender.  No audible bruit.  HEART:  Appears to be regular. LUNGS:  No crackles or wheezing audible.  Respirations even and unlabored.  RADIAL PULSE:  Equal bilaterally.   ABDOMEN:  Soft, nontender.  Bowel sounds present and normal.  No audible abdominal bruit.   EXTREMITIES:  No increased edema present.  DP pulses palpable and equal bilaterally.          Assessment & Plan:  GYN.   Back on her estrogen.  Did not tolerate coming off.  Wants to remain on estrogen.  Follow.  We discussed a slow gradual taper.  Follow.     HEALTH MAINTENANCE.  Physical  01/05/13.   Mammogram 09/29/12 - BiRADS II.  Is scheduled for a f/u mammogram 10/02/13.  Colonoscopy 03/02/13 revealed external hemorrhoids and diverticulosis.

## 2013-09-17 ENCOUNTER — Encounter: Payer: Self-pay | Admitting: Internal Medicine

## 2013-09-17 MED ORDER — ESTROGENS CONJUGATED 0.3 MG PO TABS: 0.3000 mg | ORAL_TABLET | Freq: Every day | ORAL | Status: DC

## 2013-09-17 MED ORDER — PROGESTERONE MICRONIZED 100 MG PO CAPS: 100.0000 mg | ORAL_CAPSULE | Freq: Every day | ORAL | Status: DC

## 2013-09-17 MED ORDER — ATENOLOL 100 MG PO TABS: 100.0000 mg | ORAL_TABLET | Freq: Every day | ORAL | Status: DC

## 2013-09-17 MED ORDER — AMLODIPINE BESYLATE 5 MG PO TABS: 5.0000 mg | ORAL_TABLET | Freq: Two times a day (BID) | ORAL | Status: DC

## 2013-09-17 NOTE — Assessment & Plan Note (Signed)
Colonoscopy.  Bowels doing well.

## 2013-09-17 NOTE — Assessment & Plan Note (Signed)
Low cholesterol diet and exercise.  Follow lipid panel.   

## 2013-09-17 NOTE — Assessment & Plan Note (Signed)
Blood pressure under good control.  Follow metabolic panel.   

## 2013-09-17 NOTE — Assessment & Plan Note (Signed)
Low carb diet and exercise.  Follow met b and a1c.  

## 2013-09-17 NOTE — Assessment & Plan Note (Signed)
Follow vitamin D level.  

## 2013-09-17 NOTE — Assessment & Plan Note (Signed)
On estrogen.  Continue calcium and vitamin D.  Follow vitamin D level.     

## 2013-09-26 ENCOUNTER — Encounter: Payer: Self-pay | Admitting: *Deleted

## 2013-09-26 ENCOUNTER — Other Ambulatory Visit: Payer: Self-pay | Admitting: Internal Medicine

## 2013-09-26 NOTE — Telephone Encounter (Signed)
Refilled #30 lorazepam with no refills.

## 2013-09-26 NOTE — Telephone Encounter (Signed)
Ok refill? 

## 2013-10-02 ENCOUNTER — Ambulatory Visit: Payer: Self-pay | Admitting: Internal Medicine

## 2013-10-02 DIAGNOSIS — R922 Inconclusive mammogram: Secondary | ICD-10-CM | POA: Diagnosis not present

## 2013-10-02 DIAGNOSIS — Z1231 Encounter for screening mammogram for malignant neoplasm of breast: Secondary | ICD-10-CM | POA: Diagnosis not present

## 2013-10-02 LAB — HM MAMMOGRAPHY: HM Mammogram: NEGATIVE

## 2013-10-04 ENCOUNTER — Encounter: Payer: Self-pay | Admitting: Internal Medicine

## 2013-11-13 ENCOUNTER — Encounter: Payer: Self-pay | Admitting: Internal Medicine

## 2013-12-03 ENCOUNTER — Other Ambulatory Visit: Payer: Self-pay | Admitting: Internal Medicine

## 2014-01-09 ENCOUNTER — Encounter (INDEPENDENT_AMBULATORY_CARE_PROVIDER_SITE_OTHER): Payer: Self-pay

## 2014-01-09 ENCOUNTER — Other Ambulatory Visit (INDEPENDENT_AMBULATORY_CARE_PROVIDER_SITE_OTHER): Payer: Medicare Other

## 2014-01-09 DIAGNOSIS — I10 Essential (primary) hypertension: Secondary | ICD-10-CM | POA: Diagnosis not present

## 2014-01-09 DIAGNOSIS — E7800 Pure hypercholesterolemia, unspecified: Secondary | ICD-10-CM

## 2014-01-09 DIAGNOSIS — R5383 Other fatigue: Secondary | ICD-10-CM | POA: Diagnosis not present

## 2014-01-09 DIAGNOSIS — R5381 Other malaise: Secondary | ICD-10-CM

## 2014-01-09 DIAGNOSIS — R739 Hyperglycemia, unspecified: Secondary | ICD-10-CM

## 2014-01-09 DIAGNOSIS — R7309 Other abnormal glucose: Secondary | ICD-10-CM

## 2014-01-09 LAB — LIPID PANEL
CHOLESTEROL: 179 mg/dL (ref 0–200)
HDL: 42.8 mg/dL (ref >39.00)
LDL Cholesterol: 114 mg/dL — ABNORMAL HIGH (ref 0–99)
TRIGLYCERIDES: 112 mg/dL (ref 0.0–149.0)
Total CHOL/HDL Ratio: 4
VLDL: 22.4 mg/dL (ref 0.0–40.0)

## 2014-01-09 LAB — COMPREHENSIVE METABOLIC PANEL
ALT: 18 U/L (ref 0–35)
AST: 24 U/L (ref 0–37)
Albumin: 3.6 g/dL (ref 3.5–5.2)
Alkaline Phosphatase: 57 U/L (ref 39–117)
BUN: 13 mg/dL (ref 6–23)
CHLORIDE: 106 meq/L (ref 96–112)
CO2: 27 meq/L (ref 19–32)
Calcium: 9.6 mg/dL (ref 8.4–10.5)
Creatinine, Ser: 0.9 mg/dL (ref 0.4–1.2)
GFR: 63.93 mL/min (ref >60.00)
Glucose, Bld: 113 mg/dL — ABNORMAL HIGH (ref 70–99)
Potassium: 4.3 mEq/L (ref 3.5–5.1)
Sodium: 140 mEq/L (ref 135–145)
Total Bilirubin: 0.6 mg/dL (ref 0.3–1.2)
Total Protein: 7 g/dL (ref 6.0–8.3)

## 2014-01-09 LAB — HEMOGLOBIN A1C: Hgb A1c MFr Bld: 6.3 % (ref 4.6–6.5)

## 2014-01-09 LAB — TSH: TSH: 4.42 u[IU]/mL (ref 0.35–5.50)

## 2014-01-16 ENCOUNTER — Encounter: Payer: Self-pay | Admitting: Internal Medicine

## 2014-01-16 ENCOUNTER — Ambulatory Visit (INDEPENDENT_AMBULATORY_CARE_PROVIDER_SITE_OTHER): Payer: Medicare Other | Admitting: Internal Medicine

## 2014-01-16 VITALS — BP 130/80 | HR 54 | Temp 97.9°F | Ht 60.25 in | Wt 146.2 lb

## 2014-01-16 DIAGNOSIS — K5730 Diverticulosis of large intestine without perforation or abscess without bleeding: Secondary | ICD-10-CM

## 2014-01-16 DIAGNOSIS — R7309 Other abnormal glucose: Secondary | ICD-10-CM

## 2014-01-16 DIAGNOSIS — E7800 Pure hypercholesterolemia, unspecified: Secondary | ICD-10-CM | POA: Diagnosis not present

## 2014-01-16 DIAGNOSIS — R739 Hyperglycemia, unspecified: Secondary | ICD-10-CM

## 2014-01-16 DIAGNOSIS — E2839 Other primary ovarian failure: Secondary | ICD-10-CM

## 2014-01-16 DIAGNOSIS — E559 Vitamin D deficiency, unspecified: Secondary | ICD-10-CM

## 2014-01-16 DIAGNOSIS — L989 Disorder of the skin and subcutaneous tissue, unspecified: Secondary | ICD-10-CM

## 2014-01-16 DIAGNOSIS — M899 Disorder of bone, unspecified: Secondary | ICD-10-CM

## 2014-01-16 DIAGNOSIS — M949 Disorder of cartilage, unspecified: Secondary | ICD-10-CM

## 2014-01-16 DIAGNOSIS — M8580 Other specified disorders of bone density and structure, unspecified site: Secondary | ICD-10-CM

## 2014-01-16 DIAGNOSIS — K5790 Diverticulosis of intestine, part unspecified, without perforation or abscess without bleeding: Secondary | ICD-10-CM

## 2014-01-16 DIAGNOSIS — I10 Essential (primary) hypertension: Secondary | ICD-10-CM

## 2014-01-16 NOTE — Progress Notes (Signed)
Pre visit review using our clinic review tool, if applicable. No additional management support is needed unless otherwise documented below in the visit note. 

## 2014-01-16 NOTE — Progress Notes (Signed)
Subjective:    Patient ID: Kara Dixon, female    DOB: 05-26-1936, 78 y.o.   MRN: [id number]  HPI 78 year old female with past history of hypertension and hypercholesterolemia who comes in today to follow up on these issues as well as for a complete physical exam.  She came off her estrogen previously.   Noticed not sleeping as well.  Increased nervousness.  Just did not feel as well.  Feels better.  Wants to stay on her estrogen.   Breathing stable.  Stays active.   No cardiac symptoms with increased activity or exertion.  Blood pressure doing well.  Has some persistent skin lesions.  Wants to see dermatology.     Past Medical History  Diagnosis Date  . Hypertension   . Hypercholesterolemia   . Nephrolithiasis   . Osteopenia   . Vitamin D deficiency     Current Outpatient Prescriptions on File Prior to Visit  Medication Sig Dispense Refill  . amLODipine (NORVASC) 5 MG tablet Take 1 tablet (5 mg total) by mouth 2 (two) times daily.  60 tablet  5  . aspirin 81 MG tablet Take 81 mg by mouth daily.      Marland Kitchen atenolol (TENORMIN) 100 MG tablet Take 1 tablet (100 mg total) by mouth daily.  30 tablet  5  . estrogens, conjugated, (PREMARIN) 0.3 MG tablet Take 1 tablet (0.3 mg total) by mouth daily.  30 tablet  5  . fexofenadine (ALLEGRA) 180 MG tablet Take 180 mg by mouth daily.      Marland Kitchen lactase (LACTAID) 3000 UNITS tablet Take 1 tablet by mouth as needed.      Marland Kitchen LORazepam (ATIVAN) 0.5 MG tablet TAKE 1/2 TABLET AT BEDTIME AS NEEDED  30 tablet  0  . progesterone (PROMETRIUM) 100 MG capsule Take 1 capsule (100 mg total) by mouth daily.  30 capsule  5   No current facility-administered medications on file prior to visit.    Review of Systems Patient denies any headache, lightheadedness or dizziness.   No sinus or allergy symptoms.  No chest pain, tightness or palpitations.  No increased shortness of breath, cough or congestion.  No nausea or vomiting.  No acid reflux. No abdominal pain or cramping.   No bowel change, such as diarrhea, constipation, or melana.  No urine change.   Handling stress well.   Back on her estrogen.  Did not feel well off.   Doing better since decreasing milk intake.       Objective:   Physical Exam  Filed Vitals:   01/16/14 0941  BP: 130/80  Pulse: 54  Temp: 97.9 F (36.6 C)   Blood pressure recheck:  4/39  78 year old female in no acute distress.   HEENT:  Nares- clear.  Oropharynx - without lesions. NECK:  Supple.  Nontender.  No audible bruit.  HEART:  Appears to be regular. LUNGS:  No crackles or wheezing audible.  Respirations even and unlabored.  RADIAL PULSE:  Equal bilaterally.    BREASTS:  No nipple discharge or nipple retraction present.  Could not appreciate any distinct nodules or axillary adenopathy.  ABDOMEN:  Soft, nontender.  Bowel sounds present and normal.  No audible abdominal bruit.  GU:  Not performed.     EXTREMITIES:  No increased edema present.  DP pulses palpable and equal bilaterally.          Assessment & Plan:  GYN.   Back on her estrogen.  Did not tolerate  coming off.  Wants to remain on estrogen.  Follow.    HEALTH MAINTENANCE.  Physical today.   Mammogram 10/02/13 - BiRADS I.   Colonoscopy 03/02/13 revealed external hemorrhoids and diverticulosis.  Schedule a bone density.

## 2014-01-21 ENCOUNTER — Encounter: Payer: Self-pay | Admitting: Internal Medicine

## 2014-01-21 DIAGNOSIS — L989 Disorder of the skin and subcutaneous tissue, unspecified: Secondary | ICD-10-CM | POA: Insufficient documentation

## 2014-01-21 NOTE — Assessment & Plan Note (Signed)
Blood pressure under good control.  Follow metabolic panel.   

## 2014-01-21 NOTE — Assessment & Plan Note (Signed)
Persistent right forearm lesion and left lower back lesion.  Refer to dermatology for further evaluation and treatment.

## 2014-01-21 NOTE — Assessment & Plan Note (Signed)
Colonoscopy as outlined.  Bowels doing well.     

## 2014-01-21 NOTE — Assessment & Plan Note (Addendum)
On estrogen.  Continue calcium and vitamin D.  Follow vitamin D level.  Recheck bone density.

## 2014-01-21 NOTE — Assessment & Plan Note (Signed)
Low carb diet and exercise.  Follow met b and a1c.  A1c just checked 01/10/14 - 6.3.  Follow.

## 2014-01-21 NOTE — Assessment & Plan Note (Signed)
Follow vitamin D level.  

## 2014-01-21 NOTE — Assessment & Plan Note (Signed)
Low cholesterol diet and exercise.  Follow lipid panel.   

## 2014-02-27 DIAGNOSIS — H01009 Unspecified blepharitis unspecified eye, unspecified eyelid: Secondary | ICD-10-CM | POA: Diagnosis not present

## 2014-02-28 DIAGNOSIS — L819 Disorder of pigmentation, unspecified: Secondary | ICD-10-CM | POA: Diagnosis not present

## 2014-02-28 DIAGNOSIS — D0460 Carcinoma in situ of skin of unspecified upper limb, including shoulder: Secondary | ICD-10-CM | POA: Diagnosis not present

## 2014-02-28 DIAGNOSIS — L723 Sebaceous cyst: Secondary | ICD-10-CM | POA: Diagnosis not present

## 2014-02-28 DIAGNOSIS — D485 Neoplasm of uncertain behavior of skin: Secondary | ICD-10-CM | POA: Diagnosis not present

## 2014-02-28 DIAGNOSIS — L821 Other seborrheic keratosis: Secondary | ICD-10-CM | POA: Diagnosis not present

## 2014-02-28 DIAGNOSIS — L570 Actinic keratosis: Secondary | ICD-10-CM | POA: Diagnosis not present

## 2014-03-28 DIAGNOSIS — D0460 Carcinoma in situ of skin of unspecified upper limb, including shoulder: Secondary | ICD-10-CM | POA: Diagnosis not present

## 2014-05-10 ENCOUNTER — Other Ambulatory Visit: Payer: Self-pay | Admitting: Internal Medicine

## 2014-05-14 ENCOUNTER — Other Ambulatory Visit (INDEPENDENT_AMBULATORY_CARE_PROVIDER_SITE_OTHER): Payer: Medicare Other

## 2014-05-14 DIAGNOSIS — R7309 Other abnormal glucose: Secondary | ICD-10-CM

## 2014-05-14 DIAGNOSIS — I10 Essential (primary) hypertension: Secondary | ICD-10-CM

## 2014-05-14 DIAGNOSIS — E7800 Pure hypercholesterolemia, unspecified: Secondary | ICD-10-CM | POA: Diagnosis not present

## 2014-05-14 DIAGNOSIS — R739 Hyperglycemia, unspecified: Secondary | ICD-10-CM

## 2014-05-14 LAB — LIPID PANEL
CHOL/HDL RATIO: 5
CHOLESTEROL: 200 mg/dL (ref 0–200)
HDL: 41.4 mg/dL (ref >39.00)
LDL CALC: 129 mg/dL — AB (ref 0–99)
NonHDL: 158.6
Triglycerides: 149 mg/dL (ref 0.0–149.0)
VLDL: 29.8 mg/dL (ref 0.0–40.0)

## 2014-05-14 LAB — COMPREHENSIVE METABOLIC PANEL
ALK PHOS: 56 U/L (ref 39–117)
ALT: 20 U/L (ref 0–35)
AST: 25 U/L (ref 0–37)
Albumin: 3.9 g/dL (ref 3.5–5.2)
BUN: 17 mg/dL (ref 6–23)
CALCIUM: 9.4 mg/dL (ref 8.4–10.5)
CO2: 25 mEq/L (ref 19–32)
CREATININE: 1 mg/dL (ref 0.4–1.2)
Chloride: 107 mEq/L (ref 96–112)
GFR: 59.34 mL/min — ABNORMAL LOW (ref >60.00)
Glucose, Bld: 112 mg/dL — ABNORMAL HIGH (ref 70–99)
Potassium: 3.8 mEq/L (ref 3.5–5.1)
Sodium: 138 mEq/L (ref 135–145)
Total Bilirubin: 0.4 mg/dL (ref 0.2–1.2)
Total Protein: 7.6 g/dL (ref 6.0–8.3)

## 2014-05-14 LAB — MICROALBUMIN / CREATININE URINE RATIO
CREATININE, U: 236.6 mg/dL
MICROALB/CREAT RATIO: 2 mg/g (ref 0.0–30.0)
Microalb, Ur: 4.8 mg/dL — ABNORMAL HIGH (ref 0.0–1.9)

## 2014-05-14 LAB — HEMOGLOBIN A1C: Hgb A1c MFr Bld: 6.3 % (ref 4.6–6.5)

## 2014-05-18 ENCOUNTER — Encounter: Payer: Self-pay | Admitting: Internal Medicine

## 2014-05-18 ENCOUNTER — Ambulatory Visit (INDEPENDENT_AMBULATORY_CARE_PROVIDER_SITE_OTHER): Payer: Medicare Other | Admitting: Internal Medicine

## 2014-05-18 VITALS — BP 130/70 | HR 52 | Temp 98.0°F | Ht 62.0 in | Wt 143.5 lb

## 2014-05-18 DIAGNOSIS — R739 Hyperglycemia, unspecified: Secondary | ICD-10-CM

## 2014-05-18 DIAGNOSIS — M949 Disorder of cartilage, unspecified: Secondary | ICD-10-CM

## 2014-05-18 DIAGNOSIS — E7800 Pure hypercholesterolemia, unspecified: Secondary | ICD-10-CM | POA: Diagnosis not present

## 2014-05-18 DIAGNOSIS — I10 Essential (primary) hypertension: Secondary | ICD-10-CM | POA: Diagnosis not present

## 2014-05-18 DIAGNOSIS — L989 Disorder of the skin and subcutaneous tissue, unspecified: Secondary | ICD-10-CM

## 2014-05-18 DIAGNOSIS — R7309 Other abnormal glucose: Secondary | ICD-10-CM | POA: Diagnosis not present

## 2014-05-18 DIAGNOSIS — E559 Vitamin D deficiency, unspecified: Secondary | ICD-10-CM

## 2014-05-18 DIAGNOSIS — K5730 Diverticulosis of large intestine without perforation or abscess without bleeding: Secondary | ICD-10-CM

## 2014-05-18 DIAGNOSIS — M8580 Other specified disorders of bone density and structure, unspecified site: Secondary | ICD-10-CM

## 2014-05-18 DIAGNOSIS — M899 Disorder of bone, unspecified: Secondary | ICD-10-CM

## 2014-05-18 NOTE — Progress Notes (Signed)
Subjective:    Patient ID: Kara Dixon, female    DOB: [birth year], 78 y.o.   MRN: [id number]  HPI 78 year old female with past history of hypertension and hypercholesterolemia who comes in today for a scheduled follow up.   She came off her estrogen previously.   Noticed not sleeping as well.  Increased nervousness.  Just did not feel as well.  Feels better.  Wants to stay on her estrogen.  She is taking in every day except for Wednesday and Sunday. Breathing stable.  Stays active.   No cardiac symptoms with increased activity or exertion.  Blood pressure doing well.  Saw dermatology.  Skin lesions removed.     Past Medical History  Diagnosis Date  . Hypertension   . Hypercholesterolemia   . Nephrolithiasis   . Osteopenia   . Vitamin D deficiency     Current Outpatient Prescriptions on File Prior to Visit  Medication Sig Dispense Refill  . amLODipine (NORVASC) 5 MG tablet Take 1 tablet (5 mg total) by mouth 2 (two) times daily.  60 tablet  5  . aspirin 81 MG tablet Take 81 mg by mouth daily.      Marland Kitchen atenolol (TENORMIN) 100 MG tablet TAKE 1 TABLET BY MOUTH DAILY  30 tablet  5  . estrogens, conjugated, (PREMARIN) 0.3 MG tablet Take 1 tablet (0.3 mg total) by mouth daily.  30 tablet  5  . fexofenadine (ALLEGRA) 180 MG tablet Take 180 mg by mouth daily.      Marland Kitchen lactase (LACTAID) 3000 UNITS tablet Take 1 tablet by mouth as needed.      Marland Kitchen LORazepam (ATIVAN) 0.5 MG tablet TAKE 1/2 TABLET AT BEDTIME AS NEEDED  30 tablet  0  . progesterone (PROMETRIUM) 100 MG capsule Take 1 capsule (100 mg total) by mouth daily.  30 capsule  5   No current facility-administered medications on file prior to visit.    Review of Systems Patient denies any headache, lightheadedness or dizziness.   No sinus or allergy symptoms.  No chest pain, tightness or palpitations.  No increased shortness of breath, cough or congestion.  No nausea or vomiting.  No acid reflux. No abdominal pain or cramping.  No bowel change,  such as diarrhea, constipation, or melana.  No urine change.   Handling stress well.   Back on her estrogen.  Did not feel well off.   Doing better since decreasing milk intake.       Objective:   Physical Exam  Filed Vitals:   05/18/14 0805  BP: 130/70  Pulse: 52  Temp: 98 F (36.7 C)   Blood pressure recheck:  132/72, pulse 64-74  78 year old female in no acute distress.   HEENT:  Nares- clear.  Oropharynx - without lesions. NECK:  Supple.  Nontender.  No audible bruit.  HEART:  Appears to be regular. LUNGS:  No crackles or wheezing audible.  Respirations even and unlabored.  RADIAL PULSE:  Equal bilaterally. ABDOMEN:  Soft, nontender.  Bowel sounds present and normal.  No audible abdominal bruit.   EXTREMITIES:  No increased edema present.  DP pulses palpable and equal bilaterally.          Assessment & Plan:  GYN.   Back on her estrogen.  Did not tolerate coming off.  Wants to remain on estrogen.  Follow.    HEALTH MAINTENANCE.  Physical 01/16/14.   Mammogram 10/02/13 - BiRADS I.   Colonoscopy 03/02/13 revealed external hemorrhoids and  diverticulosis.

## 2014-05-20 ENCOUNTER — Encounter: Payer: Self-pay | Admitting: Internal Medicine

## 2014-05-20 NOTE — Assessment & Plan Note (Signed)
Colonoscopy as outlined.  Bowels doing well.     

## 2014-05-20 NOTE — Assessment & Plan Note (Signed)
Referred to dermatology.  Had lesions removed.

## 2014-05-20 NOTE — Assessment & Plan Note (Addendum)
Follow vitamin D level.  Continue vitamin D supplements.   

## 2014-05-20 NOTE — Assessment & Plan Note (Signed)
On estrogen.  Continue calcium and vitamin D.  Follow vitamin D level.

## 2014-05-20 NOTE — Assessment & Plan Note (Signed)
Blood pressure under good control.  Follow metabolic panel.

## 2014-05-20 NOTE — Assessment & Plan Note (Signed)
Low cholesterol diet and exercise.  Follow lipid panel.  Cholesterol just checked 05/14/14 revealed total cholesterol 200, triglycerides 149, HDL 41 and LDL 129.

## 2014-05-20 NOTE — Assessment & Plan Note (Signed)
Low carb diet and exercise.  Follow met b and a1c.  A1c just checked 05/14/14 - 6.3.  Follow.

## 2014-06-07 ENCOUNTER — Other Ambulatory Visit: Payer: Self-pay | Admitting: Internal Medicine

## 2014-06-23 ENCOUNTER — Other Ambulatory Visit: Payer: Self-pay | Admitting: Internal Medicine

## 2014-08-28 ENCOUNTER — Other Ambulatory Visit: Payer: Self-pay | Admitting: Internal Medicine

## 2014-09-05 ENCOUNTER — Telehealth: Payer: Self-pay | Admitting: Internal Medicine

## 2014-09-05 NOTE — Telephone Encounter (Signed)
left msg to call office to reschedule appt 12/2.msn

## 2014-09-07 ENCOUNTER — Encounter: Payer: Self-pay | Admitting: Internal Medicine

## 2014-09-07 ENCOUNTER — Ambulatory Visit (INDEPENDENT_AMBULATORY_CARE_PROVIDER_SITE_OTHER): Payer: Medicare Other | Admitting: Internal Medicine

## 2014-09-07 ENCOUNTER — Other Ambulatory Visit (INDEPENDENT_AMBULATORY_CARE_PROVIDER_SITE_OTHER): Payer: Medicare Other

## 2014-09-07 VITALS — BP 120/70 | HR 61 | Temp 97.7°F | Ht 62.0 in | Wt 140.8 lb

## 2014-09-07 DIAGNOSIS — R739 Hyperglycemia, unspecified: Secondary | ICD-10-CM

## 2014-09-07 DIAGNOSIS — E7800 Pure hypercholesterolemia, unspecified: Secondary | ICD-10-CM

## 2014-09-07 DIAGNOSIS — E559 Vitamin D deficiency, unspecified: Secondary | ICD-10-CM | POA: Diagnosis not present

## 2014-09-07 DIAGNOSIS — I10 Essential (primary) hypertension: Secondary | ICD-10-CM

## 2014-09-07 NOTE — Progress Notes (Signed)
Pre visit review using our clinic review tool, if applicable. No additional management support is needed unless otherwise documented below in the visit note. 

## 2014-09-07 NOTE — Progress Notes (Signed)
Subjective:    Patient ID: Kara Dixon, female    DOB: [birth year], 78 y.o.   MRN: [id number]  HPI 78 year old female with past history of hypertension and hypercholesterolemia who comes in today for a scheduled follow up.   She came off her estrogen previously.   Noticed not sleeping as well.  Increased nervousness.  Just did not feel as well.  Feels better on her estrogen.   Wants to stay on her estrogen.  Discussed again today.   Breathing stable.  Stays active.   No cardiac symptoms with increased activity or exertion.  Blood pressure doing well.   Handling stress well.  Takes an occasional lorazepam.      Past Medical History  Diagnosis Date  . Hypertension   . Hypercholesterolemia   . Nephrolithiasis   . Osteopenia   . Vitamin D deficiency     Current Outpatient Prescriptions on File Prior to Visit  Medication Sig Dispense Refill  . amLODipine (NORVASC) 5 MG tablet Take 1 tablet (5 mg total) by mouth 2 (two) times daily. 60 tablet 5  . amLODipine (NORVASC) 5 MG tablet TAKE 1 TABLET BY MOUTH 2 TIMES DAILY. 60 tablet 5  . aspirin 81 MG tablet Take 81 mg by mouth daily.    Marland Kitchen atenolol (TENORMIN) 100 MG tablet TAKE 1 TABLET BY MOUTH DAILY 30 tablet 5  . fexofenadine (ALLEGRA) 180 MG tablet Take 180 mg by mouth daily.    Marland Kitchen lactase (LACTAID) 3000 UNITS tablet Take 1 tablet by mouth as needed.    Marland Kitchen LORazepam (ATIVAN) 0.5 MG tablet TAKE 1/2 TABLET BY MOUTH AT BEDTIME AS NEEDED 30 tablet 0  . PREMARIN 0.3 MG tablet TAKE 1 TABLET BY MOUTH DAILY 30 tablet 5  . progesterone (PROMETRIUM) 100 MG capsule TAKE ONE CAPSULE BY MOUTH DAILY 30 capsule 5   No current facility-administered medications on file prior to visit.    Review of Systems Patient denies any headache, lightheadedness or dizziness.   No sinus or allergy symptoms.  No chest pain, tightness or palpitations.  No increased shortness of breath, cough or congestion.  No nausea or vomiting.  No acid reflux. No abdominal pain or  cramping.  No bowel change, such as diarrhea, constipation, or melana.  No urine change.   Handling stress well.   Back on her estrogen.  Did not feel well off.   Doing better since decreasing milk intake.       Objective:   Physical Exam  Filed Vitals:   09/07/14 1558  BP: 120/70  Pulse: 61  Temp: 97.7 F (36.5 C)   Blood pressure recheck:  95/61  78 year old female in no acute distress.   HEENT:  Nares- clear.  Oropharynx - without lesions. NECK:  Supple.  Nontender.  No audible bruit.  HEART:  Appears to be regular. LUNGS:  No crackles or wheezing audible.  Respirations even and unlabored.  RADIAL PULSE:  Equal bilaterally. ABDOMEN:  Soft, nontender.  Bowel sounds present and normal.  No audible abdominal bruit.   EXTREMITIES:  No increased edema present.  DP pulses palpable and equal bilaterally.          Assessment & Plan:  1. Essential hypertension Blood pressure doing well.  Follow.  Same medication.  Follow met b.   2. Hypercholesterolemia Low cholesterol diet and exercise.  Follow lipid panel.  Lab Results  Component Value Date   CHOL 206* 09/07/2014   HDL 43.00 09/07/2014  LDLCALC 137* 09/07/2014   LDLDIRECT 139.4 05/12/2013   TRIG 132.0 09/07/2014   CHOLHDL 5 09/07/2014   3. Vitamin D deficiency Continue vitamin D supplements.    4. Hyperglycemia Low carb diet.  Follow.    5. GYN.   Back on her estrogen.  Did not tolerate coming off.  Wants to remain on estrogen.  Follow.    HEALTH MAINTENANCE.  Physical 01/16/14.   Mammogram 10/02/13 - BiRADS I.   Schedule a f/u mammogram.  Colonoscopy 03/02/13 revealed external hemorrhoids and diverticulosis.

## 2014-09-09 LAB — LIPID PANEL
Cholesterol: 206 mg/dL — ABNORMAL HIGH (ref 0–200)
HDL: 43 mg/dL (ref >39.00)
LDL Cholesterol: 137 mg/dL — ABNORMAL HIGH (ref 0–99)
NONHDL: 163
Total CHOL/HDL Ratio: 5
Triglycerides: 132 mg/dL (ref 0.0–149.0)
VLDL: 26.4 mg/dL (ref 0.0–40.0)

## 2014-09-09 LAB — CBC WITH DIFFERENTIAL/PLATELET
Basophils Absolute: 0.1 10*3/uL (ref 0.0–0.1)
Basophils Relative: 1 % (ref 0.0–3.0)
EOS ABS: 0.6 10*3/uL (ref 0.0–0.7)
Eosinophils Relative: 5.3 % — ABNORMAL HIGH (ref 0.0–5.0)
HCT: 41.5 % (ref 36.0–46.0)
HEMOGLOBIN: 13.6 g/dL (ref 12.0–15.0)
Lymphocytes Relative: 24.9 % (ref 12.0–46.0)
Lymphs Abs: 2.6 10*3/uL (ref 0.7–4.0)
MCHC: 32.7 g/dL (ref 30.0–36.0)
MCV: 95.7 fl (ref 78.0–100.0)
MONO ABS: 0.7 10*3/uL (ref 0.1–1.0)
Monocytes Relative: 7.2 % (ref 3.0–12.0)
NEUTROS ABS: 6.4 10*3/uL (ref 1.4–7.7)
NEUTROS PCT: 61.6 % (ref 43.0–77.0)
Platelets: 224 10*3/uL (ref 150.0–400.0)
RBC: 4.34 Mil/uL (ref 3.87–5.11)
RDW: 12.5 % (ref 11.5–15.5)
WBC: 10.4 10*3/uL (ref 4.0–10.5)

## 2014-09-09 LAB — HEPATIC FUNCTION PANEL
ALK PHOS: 50 U/L (ref 39–117)
ALT: 28 U/L (ref 0–35)
AST: 35 U/L (ref 0–37)
Albumin: 4.1 g/dL (ref 3.5–5.2)
BILIRUBIN TOTAL: 0.7 mg/dL (ref 0.2–1.2)
Bilirubin, Direct: 0.1 mg/dL (ref 0.0–0.3)
Total Protein: 7.3 g/dL (ref 6.0–8.3)

## 2014-09-09 LAB — HEMOGLOBIN A1C: Hgb A1c MFr Bld: 6.4 % (ref 4.6–6.5)

## 2014-09-09 LAB — BASIC METABOLIC PANEL
BUN: 12 mg/dL (ref 6–23)
CHLORIDE: 107 meq/L (ref 96–112)
CO2: 24 meq/L (ref 19–32)
CREATININE: 1 mg/dL (ref 0.4–1.2)
Calcium: 9 mg/dL (ref 8.4–10.5)
GFR: 57.24 mL/min — ABNORMAL LOW (ref >60.00)
Glucose, Bld: 115 mg/dL — ABNORMAL HIGH (ref 70–99)
Potassium: 4.2 mEq/L (ref 3.5–5.1)
Sodium: 139 mEq/L (ref 135–145)

## 2014-09-10 ENCOUNTER — Ambulatory Visit (INDEPENDENT_AMBULATORY_CARE_PROVIDER_SITE_OTHER): Payer: Medicare Other | Admitting: *Deleted

## 2014-09-10 ENCOUNTER — Ambulatory Visit: Payer: Medicare Other | Admitting: *Deleted

## 2014-09-10 DIAGNOSIS — Z23 Encounter for immunization: Secondary | ICD-10-CM | POA: Diagnosis not present

## 2014-09-16 ENCOUNTER — Encounter: Payer: Self-pay | Admitting: Internal Medicine

## 2014-09-18 ENCOUNTER — Other Ambulatory Visit: Payer: PRIVATE HEALTH INSURANCE

## 2014-09-20 ENCOUNTER — Ambulatory Visit: Payer: PRIVATE HEALTH INSURANCE | Admitting: Internal Medicine

## 2014-10-22 ENCOUNTER — Ambulatory Visit: Payer: Self-pay | Admitting: Internal Medicine

## 2014-10-22 DIAGNOSIS — Z1231 Encounter for screening mammogram for malignant neoplasm of breast: Secondary | ICD-10-CM | POA: Diagnosis not present

## 2014-10-22 LAB — HM MAMMOGRAPHY: HM Mammogram: NEGATIVE

## 2014-10-24 ENCOUNTER — Encounter: Payer: Self-pay | Admitting: Internal Medicine

## 2014-11-17 ENCOUNTER — Other Ambulatory Visit: Payer: Self-pay | Admitting: Internal Medicine

## 2014-12-01 ENCOUNTER — Other Ambulatory Visit: Payer: Self-pay | Admitting: Internal Medicine

## 2015-01-08 ENCOUNTER — Encounter: Payer: Self-pay | Admitting: Internal Medicine

## 2015-01-08 ENCOUNTER — Ambulatory Visit (INDEPENDENT_AMBULATORY_CARE_PROVIDER_SITE_OTHER): Payer: Medicare Other | Admitting: Internal Medicine

## 2015-01-08 VITALS — BP 110/70 | HR 50 | Temp 97.8°F | Ht 61.0 in | Wt 143.1 lb

## 2015-01-08 DIAGNOSIS — R739 Hyperglycemia, unspecified: Secondary | ICD-10-CM

## 2015-01-08 DIAGNOSIS — Z0000 Encounter for general adult medical examination without abnormal findings: Secondary | ICD-10-CM

## 2015-01-08 DIAGNOSIS — R5383 Other fatigue: Secondary | ICD-10-CM | POA: Diagnosis not present

## 2015-01-08 DIAGNOSIS — M8580 Other specified disorders of bone density and structure, unspecified site: Secondary | ICD-10-CM | POA: Diagnosis not present

## 2015-01-08 DIAGNOSIS — I10 Essential (primary) hypertension: Secondary | ICD-10-CM | POA: Diagnosis not present

## 2015-01-08 DIAGNOSIS — E7800 Pure hypercholesterolemia, unspecified: Secondary | ICD-10-CM

## 2015-01-08 DIAGNOSIS — E559 Vitamin D deficiency, unspecified: Secondary | ICD-10-CM

## 2015-01-08 LAB — BASIC METABOLIC PANEL
BUN: 14 mg/dL (ref 6–23)
CHLORIDE: 107 meq/L (ref 96–112)
CO2: 25 meq/L (ref 19–32)
Calcium: 9.6 mg/dL (ref 8.4–10.5)
Creatinine, Ser: 1.03 mg/dL (ref 0.40–1.20)
GFR: 55.27 mL/min — AB (ref >60.00)
Glucose, Bld: 124 mg/dL — ABNORMAL HIGH (ref 70–99)
Potassium: 4.4 mEq/L (ref 3.5–5.1)
Sodium: 138 mEq/L (ref 135–145)

## 2015-01-08 LAB — HEPATIC FUNCTION PANEL
ALT: 18 U/L (ref 0–35)
AST: 23 U/L (ref 0–37)
Albumin: 3.9 g/dL (ref 3.5–5.2)
Alkaline Phosphatase: 60 U/L (ref 39–117)
Bilirubin, Direct: 0.1 mg/dL (ref 0.0–0.3)
Total Bilirubin: 0.4 mg/dL (ref 0.2–1.2)
Total Protein: 7.3 g/dL (ref 6.0–8.3)

## 2015-01-08 LAB — LIPID PANEL
CHOL/HDL RATIO: 5
Cholesterol: 182 mg/dL (ref 0–200)
HDL: 40.4 mg/dL (ref >39.00)
LDL Cholesterol: 119 mg/dL — ABNORMAL HIGH (ref 0–99)
NonHDL: 141.6
Triglycerides: 113 mg/dL (ref 0.0–149.0)
VLDL: 22.6 mg/dL (ref 0.0–40.0)

## 2015-01-08 LAB — HEMOGLOBIN A1C: Hgb A1c MFr Bld: 6.3 % (ref 4.6–6.5)

## 2015-01-08 LAB — VITAMIN D 25 HYDROXY (VIT D DEFICIENCY, FRACTURES): VITD: 15.78 ng/mL — AB (ref 30.00–100.00)

## 2015-01-08 LAB — TSH: TSH: 5.86 u[IU]/mL — ABNORMAL HIGH (ref 0.35–4.50)

## 2015-01-08 NOTE — Progress Notes (Signed)
Patient ID: Kara Dixon, female   DOB: 05/14/1936, 79 y.o.   MRN: [id number]   Subjective:    Patient ID: Kara Dixon, female    DOB: 20-Feb-1936, 79 y.o.   MRN: [id number]  HPI  Patient here for her physical exam.  She states she has been under increased stress recently with her husband's medical issues and her sister-n-law's death.  She feels she is handling things relatively well.  Does not feel she need any further intervention at this time.  No cardiac symptoms with increased activity or exertion. Breathing stable.  Eating and drinking well.  Bowels stable.  She wants to remain on the estrogen.  Tried coming off.  Unable.  Only taking it 5 days per week.  Plans to try to continue to decrease.     Past Medical History  Diagnosis Date  . Hypertension   . Hypercholesterolemia   . Nephrolithiasis   . Osteopenia   . Vitamin D deficiency     Current Outpatient Prescriptions on File Prior to Visit  Medication Sig Dispense Refill  . amLODipine (NORVASC) 5 MG tablet TAKE 1 TABLET BY MOUTH 2 TIMES DAILY. 60 tablet 5  . aspirin 81 MG tablet Take 81 mg by mouth daily.    Marland Kitchen atenolol (TENORMIN) 100 MG tablet TAKE 1 TABLET BY MOUTH DAILY 30 tablet 5  . fexofenadine (ALLEGRA) 180 MG tablet Take 180 mg by mouth daily.    Marland Kitchen lactase (LACTAID) 3000 UNITS tablet Take 1 tablet by mouth as needed.    Marland Kitchen LORazepam (ATIVAN) 0.5 MG tablet TAKE 1/2 TABLET BY MOUTH AT BEDTIME AS NEEDED 30 tablet 0  . PREMARIN 0.3 MG tablet TAKE 1 TABLET BY MOUTH DAILY 30 tablet 5  . progesterone (PROMETRIUM) 100 MG capsule TAKE ONE CAPSULE BY MOUTH DAILY 30 capsule 5   No current facility-administered medications on file prior to visit.    Review of Systems  Constitutional: Negative for appetite change and unexpected weight change.  HENT: Negative for congestion and sinus pressure.   Eyes: Negative for pain and visual disturbance.  Respiratory: Negative for cough, chest tightness and shortness of breath.     Cardiovascular: Negative for chest pain, palpitations and leg swelling.  Gastrointestinal: Negative for nausea, vomiting, abdominal pain and diarrhea.  Genitourinary: Negative for dysuria and difficulty urinating.  Musculoskeletal: Negative for back pain and joint swelling.  Skin: Negative for color change and rash.  Neurological: Negative for dizziness, light-headedness and headaches.  Hematological: Negative for adenopathy. Does not bruise/bleed easily.  Psychiatric/Behavioral: Negative for dysphoric mood and decreased concentration.       Objective:     Blood pressure recheck:  128/79, pulse 56-60  Physical Exam  Constitutional: She is oriented to person, place, and time. She appears well-developed and well-nourished.  HENT:  Nose: Nose normal.  Mouth/Throat: Oropharynx is clear and moist.  Eyes: Right eye exhibits no discharge. Left eye exhibits no discharge. No scleral icterus.  Neck: Neck supple. No thyromegaly present.  Cardiovascular: Normal rate and regular rhythm.   Pulmonary/Chest: Breath sounds normal. No accessory muscle usage. No tachypnea. No respiratory distress. She has no decreased breath sounds. She has no wheezes. She has no rhonchi. Right breast exhibits no inverted nipple, no mass, no nipple discharge and no tenderness (no axillary adenopathy). Left breast exhibits no inverted nipple, no mass, no nipple discharge and no tenderness (no axilarry adenopathy).  Abdominal: Soft. Bowel sounds are normal. There is no tenderness.  Musculoskeletal: She exhibits no edema or  tenderness.  Lymphadenopathy:    She has no cervical adenopathy.  Neurological: She is alert and oriented to person, place, and time.  Skin: Skin is warm. No rash noted.  Psychiatric: She has a normal mood and affect. Her behavior is normal.    BP 110/70 mmHg  Pulse 50  Temp(Src) 97.8 F (36.6 C) (Oral)  Ht _0  (1.549 m)  Wt 143 lb 2 oz (64.921 kg)  BMI 27.06 kg/m2  SpO2 97% Wt Readings  from Last 3 Encounters:  01/08/15 143 lb 2 oz (64.921 kg)  09/07/14 140 lb 12 oz (63.844 kg)  05/18/14 143 lb 8 oz (65.091 kg)     Lab Results  Component Value Date   WBC 10.4 09/07/2014   HGB 13.6 09/07/2014   HCT 41.5 09/07/2014   PLT 224.0 09/07/2014   GLUCOSE 124* 01/08/2015   CHOL 182 01/08/2015   TRIG 113.0 01/08/2015   HDL 40.40 01/08/2015   LDLDIRECT 139.4 05/12/2013   LDLCALC 119* 01/08/2015   ALT 18 01/08/2015   AST 23 01/08/2015   NA 138 01/08/2015   K 4.4 01/08/2015   CL 107 01/08/2015   CREATININE 1.03 01/08/2015   BUN 14 01/08/2015   CO2 25 01/08/2015   TSH 5.86* 01/08/2015   HGBA1C 6.3 01/08/2015   MICROALBUR 4.8* 05/14/2014       Assessment & Plan:   Problem List Items Addressed This Visit    Health care maintenance    Physical today.  Mammogram 10/22/14 - Birads I.  Colonoscopy 03/02/13 - diverticulosis and external hemorrhoids.        Hypercholesterolemia    Low cholesterol diet and exercise.  Follow lipid panel.        Relevant Orders   Lipid panel (Completed)   Hepatic function panel (Completed)   Hyperglycemia    Low carb diet and exercise.  Follow met b and a1c.       Relevant Orders   Hemoglobin A1c (Completed)   Hypertension - Primary    Blood pressure doing well.  Same medication regimen.  Follow pressures.  Follow metabolic panel.       Relevant Orders   Basic metabolic panel (Completed)   Osteopenia    On estrogen.  Continue calcium, vitamin D and weight bearing exercise.        Vitamin D deficiency    Check vitamin D level with labs.  Continue supplements.        Relevant Orders   Vit D  25 hydroxy (rtn osteoporosis monitoring) (Completed)    Other Visit Diagnoses    Other fatigue        Relevant Orders    TSH (Completed)        Einar Pheasant, MD

## 2015-01-08 NOTE — Progress Notes (Signed)
Pre visit review using our clinic review tool, if applicable. No additional management support is needed unless otherwise documented below in the visit note. 

## 2015-01-09 ENCOUNTER — Other Ambulatory Visit: Payer: Self-pay | Admitting: Internal Medicine

## 2015-01-09 ENCOUNTER — Encounter: Payer: Self-pay | Admitting: *Deleted

## 2015-01-09 DIAGNOSIS — R7989 Other specified abnormal findings of blood chemistry: Secondary | ICD-10-CM

## 2015-01-09 NOTE — Progress Notes (Signed)
Order placed for f/u tsh.  

## 2015-01-13 ENCOUNTER — Encounter: Payer: Self-pay | Admitting: Internal Medicine

## 2015-01-13 DIAGNOSIS — Z0000 Encounter for general adult medical examination without abnormal findings: Secondary | ICD-10-CM | POA: Insufficient documentation

## 2015-01-13 NOTE — Assessment & Plan Note (Signed)
Check vitamin D level with labs.  Continue supplements.

## 2015-01-13 NOTE — Assessment & Plan Note (Signed)
Blood pressure doing well.  Same medication regimen.  Follow pressures.  Follow metabolic panel.   

## 2015-01-13 NOTE — Assessment & Plan Note (Signed)
Low cholesterol diet and exercise.  Follow lipid panel.   

## 2015-01-13 NOTE — Assessment & Plan Note (Signed)
On estrogen.  Continue calcium, vitamin D and weight bearing exercise.

## 2015-01-13 NOTE — Assessment & Plan Note (Signed)
Low carb diet and exercise.  Follow met b and a1c.  

## 2015-01-13 NOTE — Assessment & Plan Note (Signed)
Physical today.  Mammogram 10/22/14 - Birads I.  Colonoscopy 03/02/13 - diverticulosis and external hemorrhoids.

## 2015-01-21 DIAGNOSIS — M949 Disorder of cartilage, unspecified: Secondary | ICD-10-CM | POA: Diagnosis not present

## 2015-01-21 LAB — HM DEXA SCAN

## 2015-01-23 ENCOUNTER — Encounter: Payer: Self-pay | Admitting: Internal Medicine

## 2015-01-24 ENCOUNTER — Encounter: Payer: Self-pay | Admitting: *Deleted

## 2015-01-24 ENCOUNTER — Telehealth: Payer: Self-pay | Admitting: Internal Medicine

## 2015-01-24 ENCOUNTER — Encounter: Payer: Self-pay | Admitting: Internal Medicine

## 2015-01-24 NOTE — Telephone Encounter (Signed)
Notify pt that her bone density reveals osteopenia.  Does not meet criteria for osteoporosis.  No worsening from last.  Will follow.

## 2015-01-24 NOTE — Telephone Encounter (Signed)
Letter mailed

## 2015-01-31 ENCOUNTER — Ambulatory Visit (INDEPENDENT_AMBULATORY_CARE_PROVIDER_SITE_OTHER): Payer: Medicare Other | Admitting: Nurse Practitioner

## 2015-01-31 ENCOUNTER — Encounter: Payer: Self-pay | Admitting: Nurse Practitioner

## 2015-01-31 VITALS — BP 108/60 | HR 63 | Temp 98.0°F | Resp 14 | Ht 61.0 in | Wt 141.0 lb

## 2015-01-31 DIAGNOSIS — M5442 Lumbago with sciatica, left side: Secondary | ICD-10-CM | POA: Diagnosis not present

## 2015-01-31 DIAGNOSIS — M5441 Lumbago with sciatica, right side: Secondary | ICD-10-CM | POA: Diagnosis not present

## 2015-01-31 MED ORDER — PROGESTERONE MICRONIZED 100 MG PO CAPS
100.0000 mg | ORAL_CAPSULE | Freq: Every day | ORAL | Status: DC
Start: 2015-01-31 — End: 2015-03-24

## 2015-01-31 MED ORDER — PREDNISONE 10 MG PO TABS: ORAL_TABLET | ORAL | Status: DC

## 2015-01-31 MED ORDER — ESTROGENS CONJUGATED 0.3 MG PO TABS: 0.3000 mg | ORAL_TABLET | Freq: Every day | ORAL | Status: DC

## 2015-01-31 MED ORDER — AMLODIPINE BESYLATE 5 MG PO TABS: ORAL_TABLET | ORAL | Status: DC

## 2015-01-31 NOTE — Progress Notes (Signed)
Subjective:    Patient ID: Kara Dixon, female    DOB: 1936-02-21, 79 y.o.   MRN: [id number]  HPI   Ms. Straw is a 79 yo female with a CC of low back pain left leg stabbing pain x 2.5 weeks.   1) Sitting and noticed when getting up, mild at first, some days is is severe 10/10, some days not bad 3-4/10 Lower back and down left leg   Top and side not farther than right below the knee  Tylenol or Aleve- not helpful  Ice, heat, unknown cream not helpful  Documented osteopenia  Described as aching, burning, stinging, stabbing, leg- hot wire sensation  No numbness/tingling  Best position- when lying down, but not always Worst- walking  Not felt this before,   Lasted a day and went away and was mild in the past  Review of Systems  Constitutional: Negative for fever, chills, diaphoresis and fatigue.  Respiratory: Negative for chest tightness, shortness of breath and wheezing.   Cardiovascular: Negative for chest pain, palpitations and leg swelling.  Gastrointestinal: Negative for nausea, vomiting and diarrhea.  Musculoskeletal: Positive for back pain.  Skin: Negative for rash.  Neurological: Negative for dizziness, weakness, numbness and headaches.       Denies losing bowel/bladder function, saddle anesthesia, or weakness of extremities.    Psychiatric/Behavioral: The patient is not nervous/anxious.    Past Medical History  Diagnosis Date  . Hypertension   . Hypercholesterolemia   . Nephrolithiasis   . Osteopenia   . Vitamin D deficiency     History   Social History  . Marital Status: Married    Spouse Name: N/A  . Number of Children: 1  . Years of Education: N/A   Occupational History  . Not on file.   Social History Main Topics  . Smoking status: Never Smoker   . Smokeless tobacco: Never Used  . Alcohol Use: No  . Drug Use: No  . Sexual Activity: Not on file   Other Topics Concern  . Not on file   Social History Narrative    Past Surgical History    Procedure Laterality Date  . Cyst removed under tongue  age 82    Family History  Problem Relation Age of Onset  . Stroke Mother   . Hypertension Mother   . Fibromyalgia Sister   . Breast cancer Neg Hx   . Colon cancer Neg Hx     Allergies  Allergen Reactions  . Avelox [Moxifloxacin Hcl In Nacl]   . Mucinex [Guaifenesin Er] Other (See Comments)    Emesis    . Penicillins   . Sulfa Antibiotics     Current Outpatient Prescriptions on File Prior to Visit  Medication Sig Dispense Refill  . aspirin 81 MG tablet Take 81 mg by mouth daily.    Marland Kitchen atenolol (TENORMIN) 100 MG tablet TAKE 1 TABLET BY MOUTH DAILY 30 tablet 5  . fexofenadine (ALLEGRA) 180 MG tablet Take 180 mg by mouth daily.    Marland Kitchen lactase (LACTAID) 3000 UNITS tablet Take 1 tablet by mouth as needed.    Marland Kitchen LORazepam (ATIVAN) 0.5 MG tablet TAKE 1/2 TABLET BY MOUTH AT BEDTIME AS NEEDED 30 tablet 0   No current facility-administered medications on file prior to visit.      Objective:   Physical Exam  Constitutional: She is oriented to person, place, and time. She appears well-developed and well-nourished. No distress.  BP 108/60 mmHg  Pulse 63  Temp(Src) 98  F (36.7 C)  Resp 14  Ht 5\' 1"  (1.549 m)  Wt 141 lb (63.957 kg)  BMI 26.66 kg/m2  SpO2 97%   HENT:  Head: Normocephalic and atraumatic.  Right Ear: External ear normal.  Left Ear: External ear normal.  Eyes: EOM are normal. Pupils are equal, round, and reactive to light. Right eye exhibits no discharge. Left eye exhibits no discharge. No scleral icterus.  Cardiovascular: Normal rate, regular rhythm, normal heart sounds and intact distal pulses.  Exam reveals no gallop and no friction rub.   No murmur heard. Pulmonary/Chest: Effort normal and breath sounds normal. No respiratory distress. She has no wheezes. She has no rales. She exhibits no tenderness.  Neurological: She is alert and oriented to person, place, and time. No cranial nerve deficit. She exhibits  normal muscle tone. Coordination normal.  Iliopsoas 5/5 Bilateral, Tib anterior 5/5 bilateral, EHL 5/5 bilateral, no ankle clonus, intact heel/toe/sequential walking, sensation intact upper and lower extremities. Straight leg raise negative bilaterally.   Skin: Skin is warm and dry. No rash noted. She is not diaphoretic.  Psychiatric: She has a normal mood and affect. Her behavior is normal. Judgment and thought content normal.      Assessment & Plan:   Lumbago  1) Prednisone taper  2) Gave instructions on how to do the taper 3) Gave handout with instructions for home care 4) Follow up in 1 week

## 2015-01-31 NOTE — Patient Instructions (Addendum)
Prednisone with breakfast  6 tablets on day 1, 5 tablets on day 2, 4 tablets on day 3, 3 tablets on day 4, 2 tablets day 5, 1 tablet on day 6...done! (take together not separately through out the day).   Follow up in 1 week   Back Pain, Adult Low back pain is very common. About 1 in 5 people have back pain.The cause of low back pain is rarely dangerous. The pain often gets better over time.About half of people with a sudden onset of back pain feel better in just 2 weeks. About 8 in 10 people feel better by 6 weeks.  CAUSES Some common causes of back pain include:  Strain of the muscles or ligaments supporting the spine.  Wear and tear (degeneration) of the spinal discs.  Arthritis.  Direct injury to the back. DIAGNOSIS Most of the time, the direct cause of low back pain is not known.However, back pain can be treated effectively even when the exact cause of the pain is unknown.Answering your caregiver's questions about your overall health and symptoms is one of the most accurate ways to make sure the cause of your pain is not dangerous. If your caregiver needs more information, he or she may order lab work or imaging tests (X-rays or MRIs).However, even if imaging tests show changes in your back, this usually does not require surgery. HOME CARE INSTRUCTIONS For many people, back pain returns.Since low back pain is rarely dangerous, it is often a condition that people can learn to [facility] their own.   Remain active. It is stressful on the back to sit or stand in one place. Do not sit, drive, or stand in one place for more than 30 minutes at a time. Take short walks on level surfaces as soon as pain allows.Try to increase the length of time you walk each day.  Do not stay in bed.Resting more than 1 or 2 days can delay your recovery.  Do not avoid exercise or work.Your body is made to move.It is not dangerous to be active, even though your back may hurt.Your back will likely heal  faster if you return to being active before your pain is gone.  Pay attention to your body when you bend and lift. Many people have less discomfortwhen lifting if they bend their knees, keep the load close to their bodies,and avoid twisting. Often, the most comfortable positions are those that put less stress on your recovering back.  Find a comfortable position to sleep. Use a firm mattress and lie on your side with your knees slightly bent. If you lie on your back, put a pillow under your knees.  Only take over-the-counter or prescription medicines as directed by your caregiver. Over-the-counter medicines to reduce pain and inflammation are often the most helpful.Your caregiver may prescribe muscle relaxant drugs.These medicines help dull your pain so you can more quickly return to your normal activities and healthy exercise.  Put ice on the injured area.  Put ice in a plastic bag.  Place a towel between your skin and the bag.  Leave the ice on for 15-20 minutes, 03-04 times a day for the first 2 to 3 days. After that, ice and heat may be alternated to reduce pain and spasms.  Ask your caregiver about trying back exercises and gentle massage. This may be of some benefit.  Avoid feeling anxious or stressed.Stress increases muscle tension and can worsen back pain.It is important to recognize when you are anxious or stressed  and learn ways to manage it.Exercise is a great option. SEEK MEDICAL CARE IF:  You have pain that is not relieved with rest or medicine.  You have pain that does not improve in 1 week.  You have new symptoms.  You are generally not feeling well. SEEK IMMEDIATE MEDICAL CARE IF:   You have pain that radiates from your back into your legs.  You develop new bowel or bladder control problems.  You have unusual weakness or numbness in your arms or legs.  You develop nausea or vomiting.  You develop abdominal pain.  You feel faint. Document Released:  09/21/2005 Document Revised: 03/22/2012 Document Reviewed: 01/23/2014 [employer] Patient Information 2015 [place], [place]. This information is not intended to replace advice given to you by your health care provider. Make sure you discuss any questions you have with your health care provider.

## 2015-02-06 ENCOUNTER — Encounter: Payer: Self-pay | Admitting: Nurse Practitioner

## 2015-02-06 ENCOUNTER — Ambulatory Visit (INDEPENDENT_AMBULATORY_CARE_PROVIDER_SITE_OTHER): Payer: Medicare Other | Admitting: Nurse Practitioner

## 2015-02-06 ENCOUNTER — Other Ambulatory Visit (INDEPENDENT_AMBULATORY_CARE_PROVIDER_SITE_OTHER): Payer: Medicare Other

## 2015-02-06 VITALS — BP 120/72 | HR 53 | Temp 97.8°F | Ht 61.0 in | Wt 142.4 lb

## 2015-02-06 DIAGNOSIS — M5441 Lumbago with sciatica, right side: Secondary | ICD-10-CM

## 2015-02-06 DIAGNOSIS — M545 Low back pain, unspecified: Secondary | ICD-10-CM | POA: Insufficient documentation

## 2015-02-06 DIAGNOSIS — R946 Abnormal results of thyroid function studies: Secondary | ICD-10-CM

## 2015-02-06 DIAGNOSIS — M5442 Lumbago with sciatica, left side: Secondary | ICD-10-CM

## 2015-02-06 DIAGNOSIS — R7989 Other specified abnormal findings of blood chemistry: Secondary | ICD-10-CM

## 2015-02-06 LAB — TSH: TSH: 1.98 u[IU]/mL (ref 0.35–4.50)

## 2015-02-06 NOTE — Assessment & Plan Note (Signed)
Prednisone taper was helpful. Pt completed and is pleased with improvement. She wants to know how to prevent the pain from getting back to where it was. I discussed stretching, staying active, and coming in when it is painful again. Told her there was no sure course and that most likely it will flare in the future.

## 2015-02-06 NOTE — Progress Notes (Signed)
   Subjective:    Patient ID: Kara Dixon, female    DOB: [birth year], 79 y.o.   MRN: [id number]  HPI  Kara Dixon is a 79 yo female here for a 1 week follow up with back pain.   1) Intermittent shooting sensation, but much improved. She reports much less pain and the sensation is livable. She wants to prevent future back pain episodes.   Review of Systems  Constitutional: Negative for fever, chills, diaphoresis and fatigue.  Respiratory: Negative for chest tightness, shortness of breath and wheezing.   Cardiovascular: Negative for chest pain, palpitations and leg swelling.  Gastrointestinal: Negative for nausea, vomiting and diarrhea.  Musculoskeletal: Positive for back pain. Negative for myalgias, joint swelling, arthralgias, gait problem and neck pain.  Skin: Negative for rash.  Neurological: Negative for dizziness, weakness, numbness and headaches.  Psychiatric/Behavioral: The patient is not nervous/anxious.       Objective:   Physical Exam  Constitutional: She is oriented to person, place, and time. She appears well-developed and well-nourished. No distress.  BP 120/72 mmHg  Pulse 53  Temp(Src) 97.8 F (36.6 C) (Oral)  Ht 5\' 1"  (1.549 m)  Wt 142 lb 6 oz (64.581 kg)  BMI 26.92 kg/m2  SpO2 96%   HENT:  Head: Normocephalic and atraumatic.  Right Ear: External ear normal.  Left Ear: External ear normal.  Cardiovascular: Normal rate, regular rhythm and normal heart sounds.  Exam reveals no gallop and no friction rub.   No murmur heard. Pulmonary/Chest: Effort normal and breath sounds normal. No respiratory distress. She has no wheezes. She has no rales. She exhibits no tenderness.  Musculoskeletal: Normal range of motion. She exhibits no edema or tenderness.  Neurological: She is alert and oriented to person, place, and time. No cranial nerve deficit. She exhibits normal muscle tone. Coordination normal.  Skin: Skin is warm and dry. No rash noted. She is not diaphoretic.    Psychiatric: She has a normal mood and affect. Her behavior is normal. Judgment and thought content normal.         Assessment & Plan:

## 2015-02-06 NOTE — Patient Instructions (Signed)
Stay active, let us know if it gets worse in the future.   Visit the lab before leaving to check you TSH per Dr. Nicki Reaper.

## 2015-02-06 NOTE — Progress Notes (Signed)
Pre visit review using our clinic review tool, if applicable. No additional management support is needed unless otherwise documented below in the visit note. 

## 2015-02-08 ENCOUNTER — Other Ambulatory Visit: Payer: Self-pay | Admitting: Internal Medicine

## 2015-02-08 ENCOUNTER — Encounter: Payer: Self-pay | Admitting: *Deleted

## 2015-02-08 DIAGNOSIS — I10 Essential (primary) hypertension: Secondary | ICD-10-CM

## 2015-02-08 DIAGNOSIS — E7800 Pure hypercholesterolemia, unspecified: Secondary | ICD-10-CM

## 2015-02-08 DIAGNOSIS — R739 Hyperglycemia, unspecified: Secondary | ICD-10-CM

## 2015-02-08 DIAGNOSIS — R7989 Other specified abnormal findings of blood chemistry: Secondary | ICD-10-CM

## 2015-02-08 NOTE — Progress Notes (Signed)
Order placed for f/u labs.  

## 2015-03-18 DIAGNOSIS — H01003 Unspecified blepharitis right eye, unspecified eyelid: Secondary | ICD-10-CM | POA: Diagnosis not present

## 2015-03-24 ENCOUNTER — Other Ambulatory Visit: Payer: Self-pay | Admitting: Nurse Practitioner

## 2015-03-25 NOTE — Telephone Encounter (Signed)
Refilled progesterone #30 with 2 refills.

## 2015-03-25 NOTE — Telephone Encounter (Signed)
Ok refill? Last visit 01/08/15

## 2015-04-23 ENCOUNTER — Other Ambulatory Visit: Payer: Self-pay | Admitting: Nurse Practitioner

## 2015-05-13 ENCOUNTER — Other Ambulatory Visit: Payer: PRIVATE HEALTH INSURANCE

## 2015-05-13 ENCOUNTER — Ambulatory Visit: Payer: PRIVATE HEALTH INSURANCE | Admitting: Internal Medicine

## 2015-05-14 ENCOUNTER — Other Ambulatory Visit (INDEPENDENT_AMBULATORY_CARE_PROVIDER_SITE_OTHER): Payer: Medicare Other

## 2015-05-14 DIAGNOSIS — I10 Essential (primary) hypertension: Secondary | ICD-10-CM

## 2015-05-14 DIAGNOSIS — E7800 Pure hypercholesterolemia, unspecified: Secondary | ICD-10-CM

## 2015-05-14 DIAGNOSIS — R739 Hyperglycemia, unspecified: Secondary | ICD-10-CM | POA: Diagnosis not present

## 2015-05-14 DIAGNOSIS — R946 Abnormal results of thyroid function studies: Secondary | ICD-10-CM | POA: Diagnosis not present

## 2015-05-14 DIAGNOSIS — R7989 Other specified abnormal findings of blood chemistry: Secondary | ICD-10-CM

## 2015-05-14 LAB — CBC WITH DIFFERENTIAL/PLATELET
BASOS PCT: 0.6 % (ref 0.0–3.0)
Basophils Absolute: 0.1 10*3/uL (ref 0.0–0.1)
Eosinophils Absolute: 0.5 10*3/uL (ref 0.0–0.7)
Eosinophils Relative: 5.7 % — ABNORMAL HIGH (ref 0.0–5.0)
HEMATOCRIT: 40.6 % (ref 36.0–46.0)
Hemoglobin: 13.8 g/dL (ref 12.0–15.0)
Lymphocytes Relative: 35.1 % (ref 12.0–46.0)
Lymphs Abs: 3 10*3/uL (ref 0.7–4.0)
MCHC: 33.9 g/dL (ref 30.0–36.0)
MCV: 92.1 fl (ref 78.0–100.0)
Monocytes Absolute: 0.6 10*3/uL (ref 0.1–1.0)
Monocytes Relative: 7.7 % (ref 3.0–12.0)
NEUTROS PCT: 50.9 % (ref 43.0–77.0)
Neutro Abs: 4.3 10*3/uL (ref 1.4–7.7)
PLATELETS: 276 10*3/uL (ref 150.0–400.0)
RBC: 4.4 Mil/uL (ref 3.87–5.11)
RDW: 12.4 % (ref 11.5–15.5)
WBC: 8.4 10*3/uL (ref 4.0–10.5)

## 2015-05-14 LAB — HEPATIC FUNCTION PANEL
ALBUMIN: 4.2 g/dL (ref 3.5–5.2)
ALT: 17 U/L (ref 0–35)
AST: 24 U/L (ref 0–37)
Alkaline Phosphatase: 53 U/L (ref 39–117)
Bilirubin, Direct: 0.1 mg/dL (ref 0.0–0.3)
Total Bilirubin: 0.6 mg/dL (ref 0.2–1.2)
Total Protein: 7.2 g/dL (ref 6.0–8.3)

## 2015-05-14 LAB — BASIC METABOLIC PANEL
BUN: 18 mg/dL (ref 6–23)
CO2: 26 meq/L (ref 19–32)
CREATININE: 1.06 mg/dL (ref 0.40–1.20)
Calcium: 9.6 mg/dL (ref 8.4–10.5)
Chloride: 105 mEq/L (ref 96–112)
GFR: 53.42 mL/min — ABNORMAL LOW (ref >60.00)
GLUCOSE: 112 mg/dL — AB (ref 70–99)
POTASSIUM: 4.7 meq/L (ref 3.5–5.1)
SODIUM: 138 meq/L (ref 135–145)

## 2015-05-14 LAB — LIPID PANEL
CHOL/HDL RATIO: 5
Cholesterol: 203 mg/dL — ABNORMAL HIGH (ref 0–200)
HDL: 40.5 mg/dL (ref >39.00)
LDL Cholesterol: 132 mg/dL — ABNORMAL HIGH (ref 0–99)
NONHDL: 162.08
Triglycerides: 149 mg/dL (ref 0.0–149.0)
VLDL: 29.8 mg/dL (ref 0.0–40.0)

## 2015-05-14 LAB — HEMOGLOBIN A1C: Hgb A1c MFr Bld: 6.2 % (ref 4.6–6.5)

## 2015-05-14 LAB — TSH: TSH: 2.96 u[IU]/mL (ref 0.35–4.50)

## 2015-05-15 ENCOUNTER — Encounter: Payer: Self-pay | Admitting: Internal Medicine

## 2015-05-15 ENCOUNTER — Ambulatory Visit (INDEPENDENT_AMBULATORY_CARE_PROVIDER_SITE_OTHER): Payer: Medicare Other | Admitting: Internal Medicine

## 2015-05-15 VITALS — BP 116/68 | HR 52 | Temp 98.4°F | Ht 61.0 in | Wt 141.0 lb

## 2015-05-15 DIAGNOSIS — M8580 Other specified disorders of bone density and structure, unspecified site: Secondary | ICD-10-CM | POA: Diagnosis not present

## 2015-05-15 DIAGNOSIS — I10 Essential (primary) hypertension: Secondary | ICD-10-CM

## 2015-05-15 DIAGNOSIS — E7800 Pure hypercholesterolemia, unspecified: Secondary | ICD-10-CM

## 2015-05-15 DIAGNOSIS — Z1239 Encounter for other screening for malignant neoplasm of breast: Secondary | ICD-10-CM

## 2015-05-15 DIAGNOSIS — K5730 Diverticulosis of large intestine without perforation or abscess without bleeding: Secondary | ICD-10-CM | POA: Diagnosis not present

## 2015-05-15 DIAGNOSIS — E559 Vitamin D deficiency, unspecified: Secondary | ICD-10-CM

## 2015-05-15 DIAGNOSIS — R739 Hyperglycemia, unspecified: Secondary | ICD-10-CM

## 2015-05-15 MED ORDER — ESTROGENS CONJUGATED 0.3 MG PO TABS: ORAL_TABLET | ORAL | Status: DC

## 2015-05-15 MED ORDER — AMLODIPINE BESYLATE 5 MG PO TABS: ORAL_TABLET | ORAL | Status: DC

## 2015-05-15 MED ORDER — ATENOLOL 100 MG PO TABS: 100.0000 mg | ORAL_TABLET | Freq: Every day | ORAL | Status: DC

## 2015-05-15 MED ORDER — PROGESTERONE MICRONIZED 100 MG PO CAPS: ORAL_CAPSULE | ORAL | Status: DC

## 2015-05-15 MED ORDER — LORAZEPAM 0.5 MG PO TABS: 0.2500 mg | ORAL_TABLET | Freq: Every evening | ORAL | Status: DC | PRN

## 2015-05-15 NOTE — Progress Notes (Signed)
Patient ID: Kara Dixon, female   DOB: 1936/06/15, 79 y.o.   MRN: [id number]   Subjective:    Patient ID: Kara Dixon, female    DOB: 1936/06/15, 79 y.o.   MRN: [id number]  HPI  Patient here for a scheduled follow up.  She is planning to f/u with opthalmology this week.  No sinus congestion or drainage.  Tries to stay active.  No cardiac symptoms with increased activity or exertion.  No sob.  Discussed cholesterol results.  LDL increased to 132.  Discussed diet and exercise.  She desires not to take cholesterol medication.  Eating and drinking well.  Bowels stable.  Recent bone density reveals no significant change from the last bone density.  Continue weight bearing exercise.  Wants to remain on her estrogen.     Past Medical History  Diagnosis Date  . Hypertension   . Hypercholesterolemia   . Nephrolithiasis   . Osteopenia   . Vitamin D deficiency     Family history and social history reviewed.     Outpatient Encounter Prescriptions as of 05/15/2015  Medication Sig  . amLODipine (NORVASC) 5 MG tablet TAKE 1 TABLET BY MOUTH 2 TIMES DAILY.  Marland Kitchen aspirin 81 MG tablet Take 81 mg by mouth daily.  Marland Kitchen atenolol (TENORMIN) 100 MG tablet Take 1 tablet (100 mg total) by mouth daily.  Marland Kitchen estrogens, conjugated, (PREMARIN) 0.3 MG tablet TAKE 1 TABLET (0.3 MG TOTAL) BY MOUTH DAILY.  . fexofenadine (ALLEGRA) 180 MG tablet Take 180 mg by mouth daily.  Marland Kitchen lactase (LACTAID) 3000 UNITS tablet Take 1 tablet by mouth as needed.  Marland Kitchen LORazepam (ATIVAN) 0.5 MG tablet Take 0.5 tablets (0.25 mg total) by mouth at bedtime as needed.  . neomycin-polymyxin b-dexamethasone (MAXITROL) 3.5-10000-0.1 SUSP   . progesterone (PROMETRIUM) 100 MG capsule TAKE 1 CAPSULE (100 MG TOTAL) BY MOUTH DAILY.  . [DISCONTINUED] amLODipine (NORVASC) 5 MG tablet TAKE 1 TABLET BY MOUTH 2 TIMES DAILY.  . [DISCONTINUED] atenolol (TENORMIN) 100 MG tablet TAKE 1 TABLET BY MOUTH DAILY  . [DISCONTINUED] LORazepam (ATIVAN) 0.5 MG tablet TAKE  1/2 TABLET BY MOUTH AT BEDTIME AS NEEDED  . [DISCONTINUED] predniSONE (DELTASONE) 10 MG tablet Take 6 tablets by mouth with breakfast on day 1 then decrease by 1 tablet each day until gone.  . [DISCONTINUED] PREMARIN 0.3 MG tablet TAKE 1 TABLET (0.3 MG TOTAL) BY MOUTH DAILY. TAKE DAILY FOR 21 DAYS THEN DO NOT TAKE FOR 7 DAYS.  . [DISCONTINUED] progesterone (PROMETRIUM) 100 MG capsule TAKE 1 CAPSULE (100 MG TOTAL) BY MOUTH DAILY.   No facility-administered encounter medications on file as of 05/15/2015.    Review of Systems  Constitutional: Negative for appetite change and unexpected weight change.  HENT: Negative for congestion and sinus pressure.   Eyes: Negative for pain and discharge.  Respiratory: Negative for cough, chest tightness and shortness of breath.   Cardiovascular: Negative for chest pain, palpitations and leg swelling.  Gastrointestinal: Negative for nausea, vomiting, abdominal pain and diarrhea.  Genitourinary: Negative for dysuria and difficulty urinating.  Skin: Negative for color change and rash.  Neurological: Negative for dizziness, light-headedness and headaches.  Hematological: Negative for adenopathy. Does not bruise/bleed easily.  Psychiatric/Behavioral: Negative for dysphoric mood and agitation.       Objective:    Physical Exam  Constitutional: She appears well-developed and well-nourished. No distress.  HENT:  Nose: Nose normal.  Mouth/Throat: Oropharynx is clear and moist.  Eyes: Conjunctivae are normal. Right eye exhibits no discharge. Left  eye exhibits no discharge.  Neck: Neck supple. No thyromegaly present.  Cardiovascular: Normal rate and regular rhythm.   Pulmonary/Chest: Breath sounds normal. No respiratory distress. She has no wheezes.  Abdominal: Soft. Bowel sounds are normal. There is no tenderness.  Musculoskeletal: She exhibits no edema or tenderness.  Lymphadenopathy:    She has no cervical adenopathy.  Skin: No rash noted. No erythema.    Psychiatric: She has a normal mood and affect. Her behavior is normal.    BP 116/68 mmHg  Pulse 52  Temp(Src) 98.4 F (36.9 C) (Oral)  Ht 5' 1"  (1.549 m)  Wt 141 lb (63.957 kg)  BMI 26.66 kg/m2  SpO2 97% Wt Readings from Last 3 Encounters:  05/15/15 141 lb (63.957 kg)  02/06/15 142 lb 6 oz (64.581 kg)  01/31/15 141 lb (63.957 kg)    Lab Results  Component Value Date   WBC 8.4 05/14/2015   HGB 13.8 05/14/2015   HCT 40.6 05/14/2015   PLT 276.0 05/14/2015   GLUCOSE 112* 05/14/2015   CHOL 203* 05/14/2015   TRIG 149.0 05/14/2015   HDL 40.50 05/14/2015   LDLDIRECT 139.4 05/12/2013   LDLCALC 132* 05/14/2015   ALT 17 05/14/2015   AST 24 05/14/2015   NA 138 05/14/2015   K 4.7 05/14/2015   CL 105 05/14/2015   CREATININE 1.06 05/14/2015   BUN 18 05/14/2015   CO2 26 05/14/2015   TSH 2.96 05/14/2015   HGBA1C 6.2 05/14/2015   MICROALBUR 4.8* 05/14/2014       Assessment & Plan:   Problem List Items Addressed This Visit    Diverticulosis    Colonoscopy as outlined in overview.  Diverticulosis in sigmoid colon. Does well if watches her diet.  Follow.        Hypercholesterolemia    LDL increased recently - 132.  Low cholesterol diet and exercise.  Discussed cholesterol medication.  She declines.        Relevant Medications   amLODipine (NORVASC) 5 MG tablet   atenolol (TENORMIN) 100 MG tablet   Other Relevant Orders   Lipid panel   Hepatic function panel   Hyperglycemia    lwo carb diet and exercise.  Follow met b and a1c.       Relevant Orders   Hemoglobin A1c   Hypertension    Blood pressure under good control.  Continue same medication regimen.  Follow pressures.  Follow metabolic panel.        Relevant Medications   amLODipine (NORVASC) 5 MG tablet   atenolol (TENORMIN) 100 MG tablet   Other Relevant Orders   Basic metabolic panel   Osteopenia    Bone density just checked.  No significant change from last.  Follow.  Continue weight bearing exercise.         Vitamin D deficiency    Follow vitamin D level.         Other Visit Diagnoses    Breast cancer screening    -  Primary    Relevant Orders    MM DIGITAL SCREENING BILATERAL        Einar Pheasant, MD

## 2015-05-15 NOTE — Progress Notes (Signed)
Pre visit review using our clinic review tool, if applicable. No additional management support is needed unless otherwise documented below in the visit note. 

## 2015-05-17 DIAGNOSIS — H01003 Unspecified blepharitis right eye, unspecified eyelid: Secondary | ICD-10-CM | POA: Diagnosis not present

## 2015-05-18 ENCOUNTER — Encounter: Payer: Self-pay | Admitting: Internal Medicine

## 2015-05-18 NOTE — Assessment & Plan Note (Signed)
Blood pressure under good control.  Continue same medication regimen.  Follow pressures.  Follow metabolic panel.   

## 2015-05-18 NOTE — Assessment & Plan Note (Signed)
Bone density just checked.  No significant change from last.  Follow.  Continue weight bearing exercise.

## 2015-05-18 NOTE — Assessment & Plan Note (Signed)
lwo carb diet and exercise.  Follow met b and a1c.

## 2015-05-18 NOTE — Assessment & Plan Note (Signed)
Follow vitamin D level.  

## 2015-05-18 NOTE — Assessment & Plan Note (Signed)
LDL increased recently - 132.  Low cholesterol diet and exercise.  Discussed cholesterol medication.  She declines.

## 2015-05-18 NOTE — Assessment & Plan Note (Signed)
Colonoscopy as outlined in overview.  Diverticulosis in sigmoid colon. Does well if watches her diet.  Follow.

## 2015-07-08 ENCOUNTER — Other Ambulatory Visit: Payer: Self-pay | Admitting: Nurse Practitioner

## 2015-08-01 DIAGNOSIS — H0012 Chalazion right lower eyelid: Secondary | ICD-10-CM | POA: Diagnosis not present

## 2015-09-05 DIAGNOSIS — H0012 Chalazion right lower eyelid: Secondary | ICD-10-CM | POA: Diagnosis not present

## 2015-09-16 ENCOUNTER — Other Ambulatory Visit (INDEPENDENT_AMBULATORY_CARE_PROVIDER_SITE_OTHER): Payer: Medicare Other

## 2015-09-16 DIAGNOSIS — I10 Essential (primary) hypertension: Secondary | ICD-10-CM

## 2015-09-16 DIAGNOSIS — R739 Hyperglycemia, unspecified: Secondary | ICD-10-CM

## 2015-09-16 DIAGNOSIS — E7800 Pure hypercholesterolemia, unspecified: Secondary | ICD-10-CM | POA: Diagnosis not present

## 2015-09-16 LAB — BASIC METABOLIC PANEL
BUN: 16 mg/dL (ref 6–23)
CHLORIDE: 106 meq/L (ref 96–112)
CO2: 25 mEq/L (ref 19–32)
CREATININE: 1.03 mg/dL (ref 0.40–1.20)
Calcium: 9.2 mg/dL (ref 8.4–10.5)
GFR: 55.17 mL/min — ABNORMAL LOW (ref >60.00)
GLUCOSE: 109 mg/dL — AB (ref 70–99)
POTASSIUM: 4.1 meq/L (ref 3.5–5.1)
Sodium: 140 mEq/L (ref 135–145)

## 2015-09-16 LAB — HEPATIC FUNCTION PANEL
ALT: 19 U/L (ref 0–35)
AST: 22 U/L (ref 0–37)
Albumin: 4.1 g/dL (ref 3.5–5.2)
Alkaline Phosphatase: 59 U/L (ref 39–117)
BILIRUBIN DIRECT: 0.1 mg/dL (ref 0.0–0.3)
BILIRUBIN TOTAL: 0.6 mg/dL (ref 0.2–1.2)
Total Protein: 7.2 g/dL (ref 6.0–8.3)

## 2015-09-16 LAB — LIPID PANEL
CHOL/HDL RATIO: 4
CHOLESTEROL: 183 mg/dL (ref 0–200)
HDL: 41.9 mg/dL (ref >39.00)
LDL Cholesterol: 115 mg/dL — ABNORMAL HIGH (ref 0–99)
NonHDL: 141.58
Triglycerides: 134 mg/dL (ref 0.0–149.0)
VLDL: 26.8 mg/dL (ref 0.0–40.0)

## 2015-09-16 LAB — HEMOGLOBIN A1C: HEMOGLOBIN A1C: 6.1 % (ref 4.6–6.5)

## 2015-09-18 ENCOUNTER — Encounter: Payer: Self-pay | Admitting: Internal Medicine

## 2015-09-18 ENCOUNTER — Ambulatory Visit (INDEPENDENT_AMBULATORY_CARE_PROVIDER_SITE_OTHER): Payer: Medicare Other | Admitting: Internal Medicine

## 2015-09-18 VITALS — BP 110/70 | HR 49 | Temp 98.0°F | Ht 61.0 in | Wt 142.0 lb

## 2015-09-18 DIAGNOSIS — R739 Hyperglycemia, unspecified: Secondary | ICD-10-CM | POA: Diagnosis not present

## 2015-09-18 DIAGNOSIS — E7800 Pure hypercholesterolemia, unspecified: Secondary | ICD-10-CM

## 2015-09-18 DIAGNOSIS — I10 Essential (primary) hypertension: Secondary | ICD-10-CM | POA: Diagnosis not present

## 2015-09-18 DIAGNOSIS — Z1239 Encounter for other screening for malignant neoplasm of breast: Secondary | ICD-10-CM

## 2015-09-18 DIAGNOSIS — M8580 Other specified disorders of bone density and structure, unspecified site: Secondary | ICD-10-CM

## 2015-09-18 DIAGNOSIS — E559 Vitamin D deficiency, unspecified: Secondary | ICD-10-CM

## 2015-09-18 DIAGNOSIS — Z23 Encounter for immunization: Secondary | ICD-10-CM

## 2015-09-18 DIAGNOSIS — Z79899 Other long term (current) drug therapy: Secondary | ICD-10-CM

## 2015-09-18 NOTE — Progress Notes (Signed)
Pre-visit discussion using our clinic review tool. No additional management support is needed unless otherwise documented below in the visit note.  

## 2015-09-18 NOTE — Patient Instructions (Signed)
Influenza Virus Vaccine injection (Fluarix) What is this medicine? INFLUENZA VIRUS VACCINE (in floo EN zuh VAHY ruhs vak SEEN) helps to reduce the risk of getting influenza also known as the flu. This medicine may be used for other purposes; ask your health care Kara Dixon or pharmacist if you have questions. What should I tell my health care Kara Dixon before I take this medicine? They need to know if you have any of these conditions: -bleeding disorder like hemophilia -fever or infection -Guillain-Barre syndrome or other neurological problems -immune system problems -infection with the human immunodeficiency virus (HIV) or AIDS -low blood platelet counts -multiple sclerosis -an unusual or allergic reaction to influenza virus vaccine, eggs, chicken proteins, latex, gentamicin, other medicines, foods, dyes or preservatives -pregnant or trying to get pregnant -breast-feeding How should I use this medicine? This vaccine is for injection into a muscle. It is given by a health care professional. A copy of Vaccine Information Statements will be given before each vaccination. Read this sheet carefully each time. The sheet may change frequently. Talk to your pediatrician regarding the use of this medicine in children. Special care may be needed. Overdosage: If you think you have taken too much of this medicine contact a poison control center or emergency room at once. NOTE: This medicine is only for you. Do not share this medicine with others. What if I miss a dose? This does not apply. What may interact with this medicine? -chemotherapy or radiation therapy -medicines that lower your immune system like etanercept, anakinra, infliximab, and adalimumab -medicines that treat or prevent blood clots like warfarin -phenytoin -steroid medicines like prednisone or cortisone -theophylline -vaccines This list may not describe all possible interactions. Give your health care Kara Dixon a list of all the  medicines, herbs, non-prescription drugs, or dietary supplements you use. Also tell them if you smoke, drink alcohol, or use illegal drugs. Some items may interact with your medicine. What should I watch for while using this medicine? Report any side effects that do not go away within 3 days to your doctor or health care professional. Call your health care Kara Dixon if any unusual symptoms occur within 6 weeks of receiving this vaccine. You may still catch the flu, but the illness is not usually as bad. You cannot get the flu from the vaccine. The vaccine will not protect against colds or other illnesses that may cause fever. The vaccine is needed every year. What side effects may I notice from receiving this medicine? Side effects that you should report to your doctor or health care professional as soon as possible: -allergic reactions like skin rash, itching or hives, swelling of the face, lips, or tongue Side effects that usually do not require medical attention (report to your doctor or health care professional if they continue or are bothersome): -fever -headache -muscle aches and pains -pain, tenderness, redness, or swelling at site where injected -weak or tired This list may not describe all possible side effects. Call your doctor for medical advice about side effects. You may report side effects to FDA at 1-800-FDA-1088. Where should I keep my medicine? This vaccine is only given in a clinic, pharmacy, doctor's office, or other health care setting and will not be stored at home. NOTE: This sheet is a summary. It may not cover all possible information. If you have questions about this medicine, talk to your doctor, pharmacist, or health care Kara Dixon.    2016, Elsevier/Gold Standard. (2008-04-18 09:30:40)  

## 2015-09-18 NOTE — Progress Notes (Signed)
Patient ID: Kara Dixon, female   DOB: 1936-05-11, 79 y.o.   MRN: [id number]   Subjective:    Patient ID: Kara Dixon, female    DOB: [birth year], 79 y.o.   MRN: [id number]  HPI  Patient with past history of hypercholesterolemia, vitamin D deficiency and hypertension.  She comes in today to follow up on these issues.  She is trying to stay active.  No cardiac symptoms with increased activity or exertion. No sob.  No cough or congestion.  Eating and drinking well.  She does watch her diet.  Her cholesterol improved some.  We discussed her lab results.  No abdominal pain or cramping.  Bowels stable.  No urinary issues.  Increased stress.  She feels she is handling things relatively well.     Past Medical History  Diagnosis Date  . Hypertension   . Hypercholesterolemia   . Nephrolithiasis   . Osteopenia   . Vitamin D deficiency    Past Surgical History  Procedure Laterality Date  . Cyst removed under tongue  age 52   Family History  Problem Relation Age of Onset  . Stroke Mother   . Hypertension Mother   . Fibromyalgia Sister   . Breast cancer Neg Hx   . Colon cancer Neg Hx    Social History   Social History  . Marital Status: Married    Spouse Name: N/A  . Number of Children: 1  . Years of Education: N/A   Social History Main Topics  . Smoking status: Never Smoker   . Smokeless tobacco: Never Used  . Alcohol Use: No  . Drug Use: No  . Sexual Activity: Not Asked   Other Topics Concern  . None   Social History Narrative    Outpatient Encounter Prescriptions as of 09/18/2015  Medication Sig  . amLODipine (NORVASC) 5 MG tablet TAKE 1 TABLET BY MOUTH 2 TIMES DAILY.  Marland Kitchen aspirin 81 MG tablet Take 81 mg by mouth daily.  Marland Kitchen atenolol (TENORMIN) 100 MG tablet Take 1 tablet (100 mg total) by mouth daily.  Marland Kitchen estrogens, conjugated, (PREMARIN) 0.3 MG tablet TAKE 1 TABLET (0.3 MG TOTAL) BY MOUTH DAILY.  . fexofenadine (ALLEGRA) 180 MG tablet Take 180 mg by mouth daily.  Marland Kitchen  lactase (LACTAID) 3000 UNITS tablet Take 1 tablet by mouth as needed.  Marland Kitchen LORazepam (ATIVAN) 0.5 MG tablet Take 0.5 tablets (0.25 mg total) by mouth at bedtime as needed.  . progesterone (PROMETRIUM) 100 MG capsule TAKE 1 CAPSULE (100 MG TOTAL) BY MOUTH DAILY.  . [DISCONTINUED] neomycin-polymyxin b-dexamethasone (MAXITROL) 3.5-10000-0.1 SUSP    No facility-administered encounter medications on file as of 09/18/2015.   Outpatient Encounter Prescriptions as of 09/18/2015  Medication Sig  . amLODipine (NORVASC) 5 MG tablet TAKE 1 TABLET BY MOUTH 2 TIMES DAILY.  Marland Kitchen aspirin 81 MG tablet Take 81 mg by mouth daily.  Marland Kitchen atenolol (TENORMIN) 100 MG tablet Take 1 tablet (100 mg total) by mouth daily.  Marland Kitchen estrogens, conjugated, (PREMARIN) 0.3 MG tablet TAKE 1 TABLET (0.3 MG TOTAL) BY MOUTH DAILY.  . fexofenadine (ALLEGRA) 180 MG tablet Take 180 mg by mouth daily.  Marland Kitchen lactase (LACTAID) 3000 UNITS tablet Take 1 tablet by mouth as needed.  Marland Kitchen LORazepam (ATIVAN) 0.5 MG tablet Take 0.5 tablets (0.25 mg total) by mouth at bedtime as needed.  . progesterone (PROMETRIUM) 100 MG capsule TAKE 1 CAPSULE (100 MG TOTAL) BY MOUTH DAILY.  . [DISCONTINUED] neomycin-polymyxin b-dexamethasone (MAXITROL) 3.5-10000-0.1 SUSP    No  facility-administered encounter medications on file as of 09/18/2015.    Review of Systems  Constitutional: Negative for appetite change and unexpected weight change.  HENT: Negative for congestion and sinus pressure.   Respiratory: Negative for cough, chest tightness and shortness of breath.   Cardiovascular: Negative for chest pain, palpitations and leg swelling.  Gastrointestinal: Negative for nausea, vomiting, abdominal pain and diarrhea.       If she watches her diet - bowels are better.    Genitourinary: Negative for dysuria and difficulty urinating.  Musculoskeletal: Negative for back pain and joint swelling.  Skin: Negative for color change and rash.  Neurological: Negative for dizziness,  light-headedness and headaches.  Psychiatric/Behavioral: Negative for dysphoric mood and agitation.       Objective:    Physical Exam  Constitutional: She appears well-developed and well-nourished. No distress.  HENT:  Nose: Nose normal.  Mouth/Throat: Oropharynx is clear and moist.  Eyes: Conjunctivae are normal. Right eye exhibits no discharge. Left eye exhibits no discharge.  Neck: Neck supple. No thyromegaly present.  Cardiovascular: Normal rate and regular rhythm.   Pulmonary/Chest: Breath sounds normal. No respiratory distress. She has no wheezes.  Abdominal: Soft. Bowel sounds are normal. There is no tenderness.  Musculoskeletal: She exhibits no edema or tenderness.  Lymphadenopathy:    She has no cervical adenopathy.  Skin: No rash noted. No erythema.  Psychiatric: She has a normal mood and affect. Her behavior is normal.    BP 110/70 mmHg  Pulse 49  Temp(Src) 98 F (36.7 C) (Oral)  Ht _0  (1.549 m)  Wt 142 lb (64.411 kg)  BMI 26.84 kg/m2  SpO2 97% Wt Readings from Last 3 Encounters:  09/18/15 142 lb (64.411 kg)  05/15/15 141 lb (63.957 kg)  02/06/15 142 lb 6 oz (64.581 kg)     Lab Results  Component Value Date   WBC 8.4 05/14/2015   HGB 13.8 05/14/2015   HCT 40.6 05/14/2015   PLT 276.0 05/14/2015   GLUCOSE 109* 09/16/2015   CHOL 183 09/16/2015   TRIG 134.0 09/16/2015   HDL 41.90 09/16/2015   LDLDIRECT 139.4 05/12/2013   LDLCALC 115* 09/16/2015   ALT 19 09/16/2015   AST 22 09/16/2015   NA 140 09/16/2015   K 4.1 09/16/2015   CL 106 09/16/2015   CREATININE 1.03 09/16/2015   BUN 16 09/16/2015   CO2 25 09/16/2015   TSH 2.96 05/14/2015   HGBA1C 6.1 09/16/2015   MICROALBUR 4.8* 05/14/2014       Assessment & Plan:   Problem List Items Addressed This Visit    Current use of estrogen therapy    Discussed with her regarding stopping her estrogen. She has tried previously.  Did not feel well off the medication.  Desires to remain on the low dose  estrogen.  Follow.        Hypercholesterolemia    Low cholesterol diet and exercise.  Follow lipid panel.  She declines cholesterol medication.   Lab Results  Component Value Date   CHOL 183 09/16/2015   HDL 41.90 09/16/2015   LDLCALC 115* 09/16/2015   LDLDIRECT 139.4 05/12/2013   TRIG 134.0 09/16/2015   CHOLHDL 4 09/16/2015        Relevant Orders   Lipid panel   Hepatic function panel   Hyperglycemia    Low carb diet and exercise.  Follow met b and a1c.       Relevant Orders   Hemoglobin A1c   Hypertension  Blood pressure under good control.  Continue same medication regimen.  Follow pressures.  Follow metabolic panel.        Relevant Orders   Basic metabolic panel   Osteopenia    Last bone density - no significant change.  Continue weight bearing exercise.  Follow.        Vitamin D deficiency    Follow vitamin d level.       Relevant Orders   VITAMIN D 25 Hydroxy (Vit-D Deficiency, Fractures)    Other Visit Diagnoses    Screening breast examination    -  Primary    Encounter for immunization            Einar Pheasant, MD

## 2015-09-19 DIAGNOSIS — H0012 Chalazion right lower eyelid: Secondary | ICD-10-CM | POA: Diagnosis not present

## 2015-09-24 ENCOUNTER — Encounter: Payer: Self-pay | Admitting: Internal Medicine

## 2015-09-24 DIAGNOSIS — Z79899 Other long term (current) drug therapy: Secondary | ICD-10-CM | POA: Insufficient documentation

## 2015-09-24 NOTE — Assessment & Plan Note (Signed)
Low carb diet and exercise.  Follow met b and a1c.  

## 2015-09-24 NOTE — Assessment & Plan Note (Signed)
Blood pressure under good control.  Continue same medication regimen.  Follow pressures.  Follow metabolic panel.   

## 2015-09-24 NOTE — Assessment & Plan Note (Signed)
Follow vitamin d level.   

## 2015-09-24 NOTE — Assessment & Plan Note (Signed)
Last bone density - no significant change.  Continue weight bearing exercise.  Follow.

## 2015-09-24 NOTE — Assessment & Plan Note (Signed)
Low cholesterol diet and exercise.  Follow lipid panel.  She declines cholesterol medication.   Lab Results  Component Value Date   CHOL 183 09/16/2015   HDL 41.90 09/16/2015   LDLCALC 115* 09/16/2015   LDLDIRECT 139.4 05/12/2013   TRIG 134.0 09/16/2015   CHOLHDL 4 09/16/2015

## 2015-09-24 NOTE — Assessment & Plan Note (Signed)
Discussed with her regarding stopping her estrogen. She has tried previously.  Did not feel well off the medication.  Desires to remain on the low dose estrogen.  Follow.

## 2015-10-24 ENCOUNTER — Other Ambulatory Visit: Payer: Self-pay | Admitting: Internal Medicine

## 2015-10-24 ENCOUNTER — Ambulatory Visit
Admission: RE | Admit: 2015-10-24 | Discharge: 2015-10-24 | Disposition: A | Discharge status: Home / Self Care | Payer: Medicare Other | Source: Ambulatory Visit | Attending: Internal Medicine | Admitting: Internal Medicine

## 2015-10-24 DIAGNOSIS — Z1231 Encounter for screening mammogram for malignant neoplasm of breast: Secondary | ICD-10-CM | POA: Diagnosis not present

## 2015-10-24 DIAGNOSIS — Z1239 Encounter for other screening for malignant neoplasm of breast: Secondary | ICD-10-CM

## 2015-10-24 HISTORY — DX: Malignant (primary) neoplasm, unspecified: C80.1

## 2015-10-29 ENCOUNTER — Telehealth: Payer: Self-pay | Admitting: Internal Medicine

## 2015-10-29 NOTE — Telephone Encounter (Signed)
Spoke with pt

## 2015-10-29 NOTE — Telephone Encounter (Signed)
Pt is returning phone call from this office. She does not know who called.

## 2015-10-29 NOTE — Telephone Encounter (Signed)
Mammogram is ok.  Per note, she should also be getting a result letter from mammography.

## 2015-10-29 NOTE — Telephone Encounter (Signed)
Pt called stating that she wanted to know results from mammogram.

## 2015-10-31 DIAGNOSIS — H2513 Age-related nuclear cataract, bilateral: Secondary | ICD-10-CM | POA: Diagnosis not present

## 2015-11-22 ENCOUNTER — Encounter: Payer: Self-pay | Admitting: Family Medicine

## 2015-11-22 ENCOUNTER — Ambulatory Visit (INDEPENDENT_AMBULATORY_CARE_PROVIDER_SITE_OTHER): Payer: Medicare Other | Admitting: Family Medicine

## 2015-11-22 VITALS — BP 122/76 | HR 60 | Temp 97.7°F | Ht 61.0 in | Wt 144.4 lb

## 2015-11-22 DIAGNOSIS — J029 Acute pharyngitis, unspecified: Secondary | ICD-10-CM | POA: Insufficient documentation

## 2015-11-22 NOTE — Patient Instructions (Signed)
This is viral.  Tylenol for pain/discomfort.  Take care  Dr. Lacinda Axon    Pharyngitis Pharyngitis is redness, pain, and swelling (inflammation) of your pharynx.  CAUSES  Pharyngitis is usually caused by infection. Most of the time, these infections are from viruses (viral) and are part of a cold. However, sometimes pharyngitis is caused by bacteria (bacterial). Pharyngitis can also be caused by allergies. Viral pharyngitis may be spread from person to person by coughing, sneezing, and personal items or utensils (cups, forks, spoons, toothbrushes). Bacterial pharyngitis may be spread from person to person by more intimate contact, such as kissing.  SIGNS AND SYMPTOMS  Symptoms of pharyngitis include:   Sore throat.   Tiredness (fatigue).   Low-grade fever.   Headache.  Joint pain and muscle aches.  Skin rashes.  Swollen lymph nodes.  Plaque-like film on throat or tonsils (often seen with bacterial pharyngitis). DIAGNOSIS  Your health care provider will ask you questions about your illness and your symptoms. Your medical history, along with a physical exam, is often all that is needed to diagnose pharyngitis. Sometimes, a rapid strep test is done. Other lab tests may also be done, depending on the suspected cause.  TREATMENT  Viral pharyngitis will usually get better in 3-4 days without the use of medicine. Bacterial pharyngitis is treated with medicines that kill germs (antibiotics).  HOME CARE INSTRUCTIONS   Drink enough water and fluids to keep your urine clear or pale yellow.   Only take over-the-counter or prescription medicines as directed by your health care provider:   If you are prescribed antibiotics, make sure you finish them even if you start to feel better.   Do not take aspirin.   Get lots of rest.   Gargle with 8 oz of salt water ( tsp of salt per 1 qt of water) as often as every 1-2 hours to soothe your throat.   Throat lozenges (if you are not at  risk for choking) or sprays may be used to soothe your throat. SEEK MEDICAL CARE IF:   You have large, tender lumps in your neck.  You have a rash.  You cough up green, yellow-brown, or bloody spit. SEEK IMMEDIATE MEDICAL CARE IF:   Your neck becomes stiff.  You drool or are unable to swallow liquids.  You vomit or are unable to keep medicines or liquids down.  You have severe pain that does not go away with the use of recommended medicines.  You have trouble breathing (not caused by a stuffy nose). MAKE SURE YOU:   Understand these instructions.  Will watch your condition.  Will get help right away if you are not doing well or get worse.   This information is not intended to replace advice given to you by your health care provider. Make sure you discuss any questions you have with your health care provider.   Document Released: 09/21/2005 Document Revised: 07/12/2013 Document Reviewed: 05/29/2013 Elsevier Interactive Patient Education [employer].

## 2015-11-22 NOTE — Addendum Note (Signed)
Addended by: Kyra Manges on: 11/22/2015 03:25 PM   Modules accepted: Orders

## 2015-11-22 NOTE — Assessment & Plan Note (Signed)
New problem. Rapid strep negative today. Likely viral in origin. Advised OTC Tylenol and supportive care.

## 2015-11-22 NOTE — Progress Notes (Signed)
   Subjective:  Patient ID: Kara Dixon, female    DOB: March 22, 1936  Age: 80 y.o. MRN: [id number]  CC: Sore throat  HPI:  80 year old female presents to clinic today for an acute visit with complaints of sore throat.  Sore throat  Began earlier this week.  She has had known sick contacts in her grandchildren.  No associated fever.  She reports her throat feels "swollen".  She's taking Coricidin with no improvement.  No known exacerbating factors.  Patient also reports postnasal drip.  Of note, she does report that she feels better today than she has all week.  No other complaints today.  Social Hx   Social History   Social History  . Marital Status: Married    Spouse Name: N/A  . Number of Children: 1  . Years of Education: N/A   Social History Main Topics  . Smoking status: Never Smoker   . Smokeless tobacco: Never Used  . Alcohol Use: No  . Drug Use: No  . Sexual Activity: Not Asked   Other Topics Concern  . None   Social History Narrative   Review of Systems  Constitutional: Negative for fever.  HENT: Positive for postnasal drip and sore throat.    Objective:  BP 122/76 mmHg  Pulse 60  Temp(Src) 97.7 F (36.5 C) (Oral)  Ht 5\' 1"  (1.549 m)  Wt 144 lb 6 oz (65.488 kg)  BMI 27.29 kg/m2  SpO2 98%  BP/Weight 11/22/2015 09/18/2015 123XX123  Systolic BP 123XX123 [id number]  Diastolic BP 76 70 68  Wt. (Lbs) 144.38 142 141  BMI 27.29 26.84 26.66   Physical Exam  Constitutional: She appears well-developed. No distress.  HENT:  Head: Normocephalic and atraumatic.  Oropharynx with mild erythema. Normal TMs bilaterally.  Cardiovascular: Normal rate and regular rhythm.   Pulmonary/Chest: Effort normal and breath sounds normal.  Neurological: She is alert.  Psychiatric: She has a normal mood and affect.  Vitals reviewed.  Lab Results  Component Value Date   WBC 8.4 05/14/2015   HGB 13.8 05/14/2015   HCT 40.6 05/14/2015   PLT 276.0 05/14/2015   GLUCOSE 109* 09/16/2015   CHOL 183 09/16/2015   TRIG 134.0 09/16/2015   HDL 41.90 09/16/2015   LDLDIRECT 139.4 05/12/2013   LDLCALC 115* 09/16/2015   ALT 19 09/16/2015   AST 22 09/16/2015   NA 140 09/16/2015   K 4.1 09/16/2015   CL 106 09/16/2015   CREATININE 1.03 09/16/2015   BUN 16 09/16/2015   CO2 25 09/16/2015   TSH 2.96 05/14/2015   HGBA1C 6.1 09/16/2015   MICROALBUR 4.8* 05/14/2014   Assessment & Plan:   Problem List Items Addressed This Visit    Pharyngitis - Primary    New problem. Rapid strep negative today. Likely viral in origin. Advised OTC Tylenol and supportive care.         Follow-up: PRN  [place]

## 2015-11-22 NOTE — Progress Notes (Signed)
Pre visit review using our clinic review tool, if applicable. No additional management support is needed unless otherwise documented below in the visit note. 

## 2015-11-23 ENCOUNTER — Other Ambulatory Visit: Payer: Self-pay | Admitting: Internal Medicine

## 2015-11-24 LAB — CULTURE, GROUP A STREP: ORGANISM ID, BACTERIA: NORMAL

## 2015-12-10 ENCOUNTER — Other Ambulatory Visit: Payer: Self-pay | Admitting: Internal Medicine

## 2015-12-11 NOTE — Telephone Encounter (Signed)
Faxed to pharmacy

## 2015-12-11 NOTE — Telephone Encounter (Signed)
ok'd rx for lorazepam #30 with no refills.   

## 2015-12-11 NOTE — Telephone Encounter (Signed)
Refilled in August for 30-days. Please advise ?

## 2015-12-19 ENCOUNTER — Other Ambulatory Visit: Payer: Self-pay | Admitting: Internal Medicine

## 2016-01-15 ENCOUNTER — Other Ambulatory Visit: Payer: Self-pay | Admitting: Internal Medicine

## 2016-01-16 ENCOUNTER — Telehealth: Payer: Self-pay | Admitting: *Deleted

## 2016-01-16 ENCOUNTER — Other Ambulatory Visit (INDEPENDENT_AMBULATORY_CARE_PROVIDER_SITE_OTHER): Payer: Medicare Other

## 2016-01-16 DIAGNOSIS — I10 Essential (primary) hypertension: Secondary | ICD-10-CM

## 2016-01-16 DIAGNOSIS — E7800 Pure hypercholesterolemia, unspecified: Secondary | ICD-10-CM

## 2016-01-16 DIAGNOSIS — E559 Vitamin D deficiency, unspecified: Secondary | ICD-10-CM | POA: Diagnosis not present

## 2016-01-16 DIAGNOSIS — R739 Hyperglycemia, unspecified: Secondary | ICD-10-CM

## 2016-01-16 LAB — HEPATIC FUNCTION PANEL
ALT: 14 U/L (ref 0–35)
AST: 17 U/L (ref 0–37)
Albumin: 3.9 g/dL (ref 3.5–5.2)
Alkaline Phosphatase: 57 U/L (ref 39–117)
BILIRUBIN TOTAL: 0.4 mg/dL (ref 0.2–1.2)
Bilirubin, Direct: 0.1 mg/dL (ref 0.0–0.3)
TOTAL PROTEIN: 7 g/dL (ref 6.0–8.3)

## 2016-01-16 LAB — BASIC METABOLIC PANEL
BUN: 16 mg/dL (ref 6–23)
CALCIUM: 9.3 mg/dL (ref 8.4–10.5)
CO2: 26 mEq/L (ref 19–32)
Chloride: 106 mEq/L (ref 96–112)
Creatinine, Ser: 0.93 mg/dL (ref 0.40–1.20)
GFR: 62.02 mL/min (ref >60.00)
Glucose, Bld: 128 mg/dL — ABNORMAL HIGH (ref 70–99)
Potassium: 4.2 mEq/L (ref 3.5–5.1)
SODIUM: 140 meq/L (ref 135–145)

## 2016-01-16 LAB — LIPID PANEL
CHOLESTEROL: 193 mg/dL (ref 0–200)
HDL: 39.3 mg/dL (ref >39.00)
LDL CALC: 129 mg/dL — AB (ref 0–99)
NonHDL: 153.71
TRIGLYCERIDES: 125 mg/dL (ref 0.0–149.0)
Total CHOL/HDL Ratio: 5
VLDL: 25 mg/dL (ref 0.0–40.0)

## 2016-01-16 LAB — HEMOGLOBIN A1C: HEMOGLOBIN A1C: 6.2 % (ref 4.6–6.5)

## 2016-01-16 LAB — VITAMIN D 25 HYDROXY (VIT D DEFICIENCY, FRACTURES): VITD: 15.67 ng/mL — ABNORMAL LOW (ref 30.00–100.00)

## 2016-01-16 MED ORDER — AMLODIPINE BESYLATE 5 MG PO TABS: ORAL_TABLET | ORAL | Status: DC

## 2016-01-16 NOTE — Telephone Encounter (Signed)
Spoke with Melissa at the pharmacy and the request was a refill request, not a PA.

## 2016-01-16 NOTE — Telephone Encounter (Signed)
Patient requested a prior authorization for amlodipine  Pharmacy [facility]

## 2016-01-22 ENCOUNTER — Ambulatory Visit (INDEPENDENT_AMBULATORY_CARE_PROVIDER_SITE_OTHER): Payer: Medicare Other | Admitting: Internal Medicine

## 2016-01-22 ENCOUNTER — Encounter: Payer: Self-pay | Admitting: Internal Medicine

## 2016-01-22 VITALS — BP 128/80 | HR 57 | Temp 97.7°F | Resp 18 | Ht 60.25 in | Wt 143.1 lb

## 2016-01-22 DIAGNOSIS — E559 Vitamin D deficiency, unspecified: Secondary | ICD-10-CM

## 2016-01-22 DIAGNOSIS — R739 Hyperglycemia, unspecified: Secondary | ICD-10-CM

## 2016-01-22 DIAGNOSIS — I10 Essential (primary) hypertension: Secondary | ICD-10-CM | POA: Diagnosis not present

## 2016-01-22 DIAGNOSIS — M8580 Other specified disorders of bone density and structure, unspecified site: Secondary | ICD-10-CM | POA: Diagnosis not present

## 2016-01-22 DIAGNOSIS — E7800 Pure hypercholesterolemia, unspecified: Secondary | ICD-10-CM

## 2016-01-22 DIAGNOSIS — Z0000 Encounter for general adult medical examination without abnormal findings: Secondary | ICD-10-CM

## 2016-01-22 DIAGNOSIS — Z79899 Other long term (current) drug therapy: Secondary | ICD-10-CM

## 2016-01-22 MED ORDER — AMLODIPINE BESYLATE 5 MG PO TABS: ORAL_TABLET | ORAL | Status: DC

## 2016-01-22 NOTE — Progress Notes (Signed)
Pre-visit discussion using our clinic review tool. No additional management support is needed unless otherwise documented below in the visit note.  

## 2016-01-22 NOTE — Progress Notes (Signed)
Patient ID: Triniece Zajicek, female   DOB: January 15, 1936, 80 y.o.   MRN: [id number]   Subjective:    Patient ID: [name], female    DOB: 1936-07-24, 80 y.o.   MRN: [id number]  HPI  Patient here for her physical exam.  She feels she is doing well.  Feels good.  Stays active.  No cardiac symptoms with increased activity or exertion.  No sob.  No acid reflux.  No abdominal pain or cramping.  Bowels stable.  Handling stress well.  Desires to continue on estrogen.  Has tried to stop in on a couple of occasions.  Did not feel well.  Understands risk of continued estrogen therapy.     Past Medical History  Diagnosis Date  . Hypertension   . Hypercholesterolemia   . Nephrolithiasis   . Osteopenia   . Vitamin D deficiency   . Cancer [facility])     skin   Past Surgical History  Procedure Laterality Date  . Cyst removed under tongue  age 51   Family History  Problem Relation Age of Onset  . Stroke Mother   . Hypertension Mother   . Fibromyalgia Sister   . Breast cancer Neg Hx   . Colon cancer Neg Hx    Social History   Social History  . Marital Status: Married    Spouse Name: N/A  . Number of Children: 1  . Years of Education: N/A   Social History Main Topics  . Smoking status: Never Smoker   . Smokeless tobacco: Never Used  . Alcohol Use: No  . Drug Use: No  . Sexual Activity: Not Asked   Other Topics Concern  . None   Social History Narrative    Outpatient Encounter Prescriptions as of 01/22/2016  Medication Sig  . amLODipine (NORVASC) 5 MG tablet TAKE 1 TABLET BY MOUTH 2 TIMES DAILY.  Marland Kitchen aspirin 81 MG tablet Take 81 mg by mouth daily.  Marland Kitchen atenolol (TENORMIN) 100 MG tablet TAKE 1 TABLET (100 MG TOTAL) BY MOUTH DAILY.  . fexofenadine (ALLEGRA) 180 MG tablet Take 180 mg by mouth daily.  Marland Kitchen lactase (LACTAID) 3000 UNITS tablet Take 1 tablet by mouth as needed.  Marland Kitchen LORazepam (ATIVAN) 0.5 MG tablet TAKE 1/2 TABLET BY MOUTH AT BEDTIME AS NEEDED  . PREMARIN 0.3 MG tablet TAKE 1 TABLET  (0.3 MG TOTAL) BY MOUTH DAILY.  . progesterone (PROMETRIUM) 100 MG capsule TAKE 1 CAPSULE (100 MG TOTAL) BY MOUTH DAILY.  . [DISCONTINUED] amLODipine (NORVASC) 5 MG tablet TAKE 1 TABLET BY MOUTH 2 TIMES DAILY.   No facility-administered encounter medications on file as of 01/22/2016.    Review of Systems  Constitutional: Negative for appetite change and unexpected weight change.  HENT: Negative for congestion and sinus pressure.   Eyes: Negative for pain and visual disturbance.  Respiratory: Negative for cough, chest tightness and shortness of breath.   Cardiovascular: Negative for chest pain, palpitations and leg swelling.  Gastrointestinal: Negative for nausea, vomiting, abdominal pain and diarrhea.  Genitourinary: Negative for dysuria and difficulty urinating.  Musculoskeletal: Negative for back pain and joint swelling.  Skin: Negative for color change and rash.  Neurological: Negative for dizziness, light-headedness and headaches.  Hematological: Negative for adenopathy. Does not bruise/bleed easily.  Psychiatric/Behavioral: Negative for dysphoric mood and agitation.      Objective:     Blood pressure rechecked by me:  128/78  Physical Exam  Constitutional: She is oriented to person, place, and time. She appears well-developed and  well-nourished. No distress.  HENT:  Nose: Nose normal.  Mouth/Throat: Oropharynx is clear and moist.  Eyes: Right eye exhibits no discharge. Left eye exhibits no discharge. No scleral icterus.  Neck: Neck supple. No thyromegaly present.  Cardiovascular: Normal rate and regular rhythm.   Pulmonary/Chest: Breath sounds normal. No accessory muscle usage. No tachypnea. No respiratory distress. She has no decreased breath sounds. She has no wheezes. She has no rhonchi. Right breast exhibits no inverted nipple, no mass, no nipple discharge and no tenderness (no axillary adenopathy). Left breast exhibits no inverted nipple, no mass, no nipple discharge and no  tenderness (no axilarry adenopathy).  Abdominal: Soft. Bowel sounds are normal. There is no tenderness.  Musculoskeletal: She exhibits no edema or tenderness.  Lymphadenopathy:    She has no cervical adenopathy.  Neurological: She is alert and oriented to person, place, and time.  Skin: Skin is warm. No rash noted. No erythema.  Psychiatric: She has a normal mood and affect. Her behavior is normal.    BP 128/80 mmHg  Pulse 57  Temp(Src) 97.7 F (36.5 C) (Oral)  Resp 18  Ht 5' 0.25" (1.53 m)  Wt 143 lb 2 oz (64.921 kg)  BMI 27.73 kg/m2  SpO2 97% Wt Readings from Last 3 Encounters:  01/22/16 143 lb 2 oz (64.921 kg)  11/22/15 144 lb 6 oz (65.488 kg)  09/18/15 142 lb (64.411 kg)     Lab Results  Component Value Date   WBC 8.4 05/14/2015   HGB 13.8 05/14/2015   HCT 40.6 05/14/2015   PLT 276.0 05/14/2015   GLUCOSE 128* 01/16/2016   CHOL 193 01/16/2016   TRIG 125.0 01/16/2016   HDL 39.30 01/16/2016   LDLDIRECT 139.4 05/12/2013   LDLCALC 129* 01/16/2016   ALT 14 01/16/2016   AST 17 01/16/2016   NA 140 01/16/2016   K 4.2 01/16/2016   CL 106 01/16/2016   CREATININE 0.93 01/16/2016   BUN 16 01/16/2016   CO2 26 01/16/2016   TSH 2.96 05/14/2015   HGBA1C 6.2 01/16/2016   MICROALBUR 4.8* 05/14/2014    Mm Digital Screening Bilateral  10/24/2015  CLINICAL DATA:  Screening. EXAM: DIGITAL SCREENING BILATERAL MAMMOGRAM WITH CAD COMPARISON:  Previous exam(s). ACR Breast Density Category b: There are scattered areas of fibroglandular density. FINDINGS: There are no findings suspicious for malignancy. Images were processed with CAD. IMPRESSION: No mammographic evidence of malignancy. A result letter of this screening mammogram will be mailed directly to the patient. RECOMMENDATION: Screening mammogram in one year. (Code:SM-B-01Y) BI-RADS CATEGORY  1: Negative. Electronically Signed   By: Franki Cabot M.D.   On: 10/24/2015 08:44       Assessment & Plan:   Problem List Items  Addressed This Visit    Current use of estrogen therapy    Continues on estrogen therapy.  Discussed stopping.  Have tried a couple of times to stop.  She does not feel good off.  Desires to stay on estrogen.        Health care maintenance    Physical today 01/22/16.  Mammogram 10/24/15 - birads I.  Colonoscopy 03/02/13.        Hypercholesterolemia    Low cholesterol diet and exercise.  Follow lipid panel.        Relevant Medications   amLODipine (NORVASC) 5 MG tablet   Other Relevant Orders   Lipid panel   Hepatic function panel   Hyperglycemia    Low carb diet and exercise.  Follow.  Relevant Orders   Hemoglobin A1c   Hypertension    Blood pressure under good control.  Continue same medication regimen.  Follow pressures.  Follow metabolic panel.        Relevant Medications   amLODipine (NORVASC) 5 MG tablet   Other Relevant Orders   TSH   CBC with Differential/Platelet   Basic metabolic panel   Osteopenia    Last bone density reveals no significant change.  Continue weight bearing exercise.        Vitamin D deficiency    Follow vitamin D level.         Other Visit Diagnoses    Routine general medical examination at a health care facility    -  Primary        Einar Pheasant, MD

## 2016-02-02 ENCOUNTER — Encounter: Payer: Self-pay | Admitting: Internal Medicine

## 2016-02-02 NOTE — Assessment & Plan Note (Signed)
Last bone density reveals no significant change.  Continue weight bearing exercise.

## 2016-02-02 NOTE — Assessment & Plan Note (Signed)
Low cholesterol diet and exercise.  Follow lipid panel.   

## 2016-02-02 NOTE — Assessment & Plan Note (Signed)
Blood pressure under good control.  Continue same medication regimen.  Follow pressures.  Follow metabolic panel.   

## 2016-02-02 NOTE — Assessment & Plan Note (Signed)
Follow vitamin D level.  

## 2016-02-02 NOTE — Assessment & Plan Note (Signed)
Low carb diet and exercise.  Follow.  

## 2016-02-02 NOTE — Assessment & Plan Note (Signed)
Physical today 01/22/16.  Mammogram 10/24/15 - birads I.  Colonoscopy 03/02/13.

## 2016-02-02 NOTE — Assessment & Plan Note (Signed)
Continues on estrogen therapy.  Discussed stopping.  Have tried a couple of times to stop.  She does not feel good off.  Desires to stay on estrogen.

## 2016-03-04 ENCOUNTER — Other Ambulatory Visit: Payer: Self-pay | Admitting: Internal Medicine

## 2016-03-05 NOTE — Telephone Encounter (Signed)
Rx refill sent to pharmacy. 

## 2016-03-10 ENCOUNTER — Other Ambulatory Visit: Payer: Self-pay | Admitting: Internal Medicine

## 2016-03-10 NOTE — Telephone Encounter (Signed)
Last office visit 01/22/16 Next visit 05/29/16 Okay to fill?

## 2016-03-11 NOTE — Telephone Encounter (Signed)
ok'd refill lorazepam #30 with no refill.

## 2016-05-26 ENCOUNTER — Other Ambulatory Visit: Payer: Medicare Other

## 2016-05-26 DIAGNOSIS — I10 Essential (primary) hypertension: Secondary | ICD-10-CM

## 2016-05-26 DIAGNOSIS — E7800 Pure hypercholesterolemia, unspecified: Secondary | ICD-10-CM

## 2016-05-26 DIAGNOSIS — R739 Hyperglycemia, unspecified: Secondary | ICD-10-CM

## 2016-05-26 NOTE — Progress Notes (Signed)
Unable to collect labs today. Pt will retry at appt on Friday.

## 2016-05-29 ENCOUNTER — Encounter: Payer: Self-pay | Admitting: Internal Medicine

## 2016-05-29 ENCOUNTER — Ambulatory Visit (INDEPENDENT_AMBULATORY_CARE_PROVIDER_SITE_OTHER): Payer: Medicare Other | Admitting: Internal Medicine

## 2016-05-29 VITALS — BP 100/70 | HR 57 | Temp 97.6°F | Resp 18 | Ht 60.25 in | Wt 142.2 lb

## 2016-05-29 DIAGNOSIS — E7800 Pure hypercholesterolemia, unspecified: Secondary | ICD-10-CM

## 2016-05-29 DIAGNOSIS — H029 Unspecified disorder of eyelid: Secondary | ICD-10-CM | POA: Diagnosis not present

## 2016-05-29 DIAGNOSIS — I10 Essential (primary) hypertension: Secondary | ICD-10-CM | POA: Diagnosis not present

## 2016-05-29 DIAGNOSIS — R739 Hyperglycemia, unspecified: Secondary | ICD-10-CM

## 2016-05-29 DIAGNOSIS — Z79899 Other long term (current) drug therapy: Secondary | ICD-10-CM

## 2016-05-29 LAB — CBC WITH DIFFERENTIAL/PLATELET
BASOS PCT: 0.7 % (ref 0.0–3.0)
Basophils Absolute: 0.1 10*3/uL (ref 0.0–0.1)
EOS PCT: 6.8 % — AB (ref 0.0–5.0)
Eosinophils Absolute: 0.5 10*3/uL (ref 0.0–0.7)
HEMATOCRIT: 40.7 % (ref 36.0–46.0)
Hemoglobin: 13.9 g/dL (ref 12.0–15.0)
LYMPHS ABS: 3 10*3/uL (ref 0.7–4.0)
Lymphocytes Relative: 37.6 % (ref 12.0–46.0)
MCHC: 34.1 g/dL (ref 30.0–36.0)
MCV: 91.8 fl (ref 78.0–100.0)
MONOS PCT: 9.2 % (ref 3.0–12.0)
Monocytes Absolute: 0.7 10*3/uL (ref 0.1–1.0)
NEUTROS ABS: 3.6 10*3/uL (ref 1.4–7.7)
NEUTROS PCT: 45.7 % (ref 43.0–77.0)
PLATELETS: 265 10*3/uL (ref 150.0–400.0)
RBC: 4.44 Mil/uL (ref 3.87–5.11)
RDW: 12.9 % (ref 11.5–15.5)
WBC: 7.9 10*3/uL (ref 4.0–10.5)

## 2016-05-29 LAB — HEMOGLOBIN A1C: Hgb A1c MFr Bld: 6.2 % (ref 4.6–6.5)

## 2016-05-29 LAB — BASIC METABOLIC PANEL
BUN: 13 mg/dL (ref 6–23)
CALCIUM: 8.9 mg/dL (ref 8.4–10.5)
CHLORIDE: 107 meq/L (ref 96–112)
CO2: 26 meq/L (ref 19–32)
Creatinine, Ser: 0.99 mg/dL (ref 0.40–1.20)
GFR: 57.65 mL/min — ABNORMAL LOW (ref >60.00)
GLUCOSE: 129 mg/dL — AB (ref 70–99)
Potassium: 4 mEq/L (ref 3.5–5.1)
SODIUM: 140 meq/L (ref 135–145)

## 2016-05-29 LAB — LIPID PANEL
CHOLESTEROL: 200 mg/dL (ref 0–200)
HDL: 44.1 mg/dL (ref >39.00)
LDL Cholesterol: 122 mg/dL — ABNORMAL HIGH (ref 0–99)
NonHDL: 156.11
Total CHOL/HDL Ratio: 5
Triglycerides: 169 mg/dL — ABNORMAL HIGH (ref 0.0–149.0)
VLDL: 33.8 mg/dL (ref 0.0–40.0)

## 2016-05-29 LAB — TSH: TSH: 5.61 u[IU]/mL — ABNORMAL HIGH (ref 0.35–4.50)

## 2016-05-29 LAB — HEPATIC FUNCTION PANEL
ALBUMIN: 4.1 g/dL (ref 3.5–5.2)
ALT: 15 U/L (ref 0–35)
AST: 19 U/L (ref 0–37)
Alkaline Phosphatase: 55 U/L (ref 39–117)
Bilirubin, Direct: 0 mg/dL (ref 0.0–0.3)
TOTAL PROTEIN: 7.5 g/dL (ref 6.0–8.3)
Total Bilirubin: 0.4 mg/dL (ref 0.2–1.2)

## 2016-05-29 NOTE — Progress Notes (Signed)
Patient ID: Kara Dixon, female   DOB: [birth year], 80 y.o.   MRN: [id number]   Subjective:    Patient ID: Kara Dixon, female    DOB: [birth year], 80 y.o.   MRN: [id number]  HPI  Patient here for a scheduled follow up.  Increased stress with her husband's health issues.  Overall she feel she is doing relatively well.  Does not feel needs anything more at this time.  Stays active.  No cardiac symptoms with increased activity or exertion.  Breathing stable.  Left eyelid lesion.  Raised.  Had one on her right eye.  Saw eye MD three times.  Got worse with abx.  Resolved on its on.  States her left eyelid lesion started a few weeks ago.  Is better.  Using warm compresses.  No abdominal pain.  Bowels stable.  Discussed estrogen.  She wants to remain on her estrogen.    Past Medical History:  Diagnosis Date  . Cancer ([place])    skin  . Hypercholesterolemia   . Hypertension   . Nephrolithiasis   . Osteopenia   . Vitamin D deficiency    Past Surgical History:  Procedure Laterality Date  . cyst removed under tongue  age 74   Family History  Problem Relation Age of Onset  . Stroke Mother   . Hypertension Mother   . Fibromyalgia Sister   . Breast cancer Neg Hx   . Colon cancer Neg Hx    Social History   Social History  . Marital status: Married    Spouse name: N/A  . Number of children: 1  . Years of education: N/A   Social History Main Topics  . Smoking status: Never Smoker  . Smokeless tobacco: Never Used  . Alcohol use No  . Drug use: No  . Sexual activity: Not Asked   Other Topics Concern  . None   Social History Narrative  . None    Outpatient Encounter Prescriptions as of 05/29/2016  Medication Sig  . amLODipine (NORVASC) 5 MG tablet TAKE 1 TABLET BY MOUTH 2 TIMES DAILY.  Marland Kitchen aspirin 81 MG tablet Take 81 mg by mouth daily.  Marland Kitchen atenolol (TENORMIN) 100 MG tablet TAKE 1 TABLET (100 MG TOTAL) BY MOUTH DAILY.  . fexofenadine (ALLEGRA) 180 MG tablet Take 180 mg by mouth  daily.  Marland Kitchen lactase (LACTAID) 3000 UNITS tablet Take 1 tablet by mouth as needed.  Marland Kitchen LORazepam (ATIVAN) 0.5 MG tablet TAKE 1/2 TABLET BY MOUTH AT BEDTIME AS NEEDED  . PREMARIN 0.3 MG tablet TAKE 1 TABLET (0.3 MG TOTAL) BY MOUTH DAILY.  . progesterone (PROMETRIUM) 100 MG capsule TAKE 1 CAPSULE (100 MG TOTAL) BY MOUTH DAILY.   No facility-administered encounter medications on file as of 05/29/2016.     Review of Systems  Constitutional: Negative for appetite change and unexpected weight change.  HENT: Negative for congestion and sinus pressure.   Respiratory: Negative for cough, chest tightness and shortness of breath.   Cardiovascular: Negative for chest pain, palpitations and leg swelling.  Gastrointestinal: Negative for abdominal pain, diarrhea, nausea and vomiting.  Genitourinary: Negative for difficulty urinating and dysuria.  Musculoskeletal: Negative for back pain and joint swelling.  Skin: Negative for color change and rash.       Left eyelid lesion as outlined.    Neurological: Negative for dizziness, light-headedness and headaches.  Psychiatric/Behavioral: Negative for agitation and dysphoric mood.       Increased stress as outlined.  Objective:    Physical Exam  Constitutional: She appears well-developed and well-nourished. No distress.  HENT:  Nose: Nose normal.  Mouth/Throat: Oropharynx is clear and moist.  Neck: Neck supple. No thyromegaly present.  Cardiovascular: Normal rate and regular rhythm.   Pulmonary/Chest: Breath sounds normal. No respiratory distress. She has no wheezes.  Abdominal: Soft. Bowel sounds are normal. There is no tenderness.  Musculoskeletal: She exhibits no edema or tenderness.  Lymphadenopathy:    She has no cervical adenopathy.  Skin: No rash noted. No erythema.  Raised lesion - left eyelid.    Psychiatric: She has a normal mood and affect. Her behavior is normal.    BP 100/70   Pulse (!) 57   Temp 97.6 F (36.4 C) (Oral)   Resp  18   Ht 5' 0.25" (1.53 m)   Wt 142 lb 4 oz (64.5 kg)   SpO2 97%   BMI 27.55 kg/m  Wt Readings from Last 3 Encounters:  05/29/16 142 lb 4 oz (64.5 kg)  01/22/16 143 lb 2 oz (64.9 kg)  11/22/15 144 lb 6 oz (65.5 kg)     Lab Results  Component Value Date   WBC 8.4 05/14/2015   HGB 13.8 05/14/2015   HCT 40.6 05/14/2015   PLT 276.0 05/14/2015   GLUCOSE 128 (H) 01/16/2016   CHOL 193 01/16/2016   TRIG 125.0 01/16/2016   HDL 39.30 01/16/2016   LDLDIRECT 139.4 05/12/2013   LDLCALC 129 (H) 01/16/2016   ALT 14 01/16/2016   AST 17 01/16/2016   NA 140 01/16/2016   K 4.2 01/16/2016   CL 106 01/16/2016   CREATININE 0.93 01/16/2016   BUN 16 01/16/2016   CO2 26 01/16/2016   TSH 2.96 05/14/2015   HGBA1C 6.2 01/16/2016   MICROALBUR 4.8 (H) 05/14/2014    Mm Digital Screening Bilateral  Result Date: 10/24/2015 CLINICAL DATA:  Screening. EXAM: DIGITAL SCREENING BILATERAL MAMMOGRAM WITH CAD COMPARISON:  Previous exam(s). ACR Breast Density Category b: There are scattered areas of fibroglandular density. FINDINGS: There are no findings suspicious for malignancy. Images were processed with CAD. IMPRESSION: No mammographic evidence of malignancy. A result letter of this screening mammogram will be mailed directly to the patient. RECOMMENDATION: Screening mammogram in one year. (Code:SM-B-01Y) BI-RADS CATEGORY  1: Negative. Electronically Signed   By: Franki Cabot M.D.   On: 10/24/2015 08:44       Assessment & Plan:   Problem List Items Addressed This Visit    Current use of estrogen therapy    Discussed with her today. She desires to remain on her estrogen therapy.  Understands risk of medication.       Hypercholesterolemia    Low cholesterol diet and exercise.  Follow lipid panel.        Hyperglycemia    Low carb diet and exercise.  Follow met b and a1c.       Hypertension    Blood pressure under good control.  Continue same medication regimen.  Follow pressures.  Follow metabolic  panel.         Other Visit Diagnoses    Eyelid lesion    -  Primary   warm compresses.  notify me if persistent.        Einar Pheasant, MD

## 2016-05-29 NOTE — Progress Notes (Signed)
Pre-visit discussion using our clinic review tool. No additional management support is needed unless otherwise documented below in the visit note.  

## 2016-05-29 NOTE — Assessment & Plan Note (Signed)
Low carb diet and exercise.  Follow met b and a1c.  

## 2016-05-29 NOTE — Assessment & Plan Note (Signed)
Blood pressure under good control.  Continue same medication regimen.  Follow pressures.  Follow metabolic panel.   

## 2016-05-29 NOTE — Assessment & Plan Note (Signed)
Discussed with her today. She desires to remain on her estrogen therapy.  Understands risk of medication.

## 2016-05-29 NOTE — Assessment & Plan Note (Signed)
Low cholesterol diet and exercise.  Follow lipid panel.   

## 2016-06-01 ENCOUNTER — Other Ambulatory Visit: Payer: Self-pay | Admitting: Internal Medicine

## 2016-06-01 DIAGNOSIS — R7989 Other specified abnormal findings of blood chemistry: Secondary | ICD-10-CM

## 2016-07-03 ENCOUNTER — Other Ambulatory Visit (INDEPENDENT_AMBULATORY_CARE_PROVIDER_SITE_OTHER): Payer: Medicare Other

## 2016-07-03 DIAGNOSIS — R946 Abnormal results of thyroid function studies: Secondary | ICD-10-CM | POA: Diagnosis not present

## 2016-07-03 DIAGNOSIS — R7989 Other specified abnormal findings of blood chemistry: Secondary | ICD-10-CM

## 2016-07-03 LAB — TSH: TSH: 3.47 u[IU]/mL (ref 0.35–4.50)

## 2016-08-03 ENCOUNTER — Telehealth: Payer: Self-pay | Admitting: Internal Medicine

## 2016-08-03 NOTE — Telephone Encounter (Signed)
I called pt and left a vm to call office to sch AWV. Thank you! °

## 2016-08-29 ENCOUNTER — Other Ambulatory Visit: Payer: Self-pay | Admitting: Internal Medicine

## 2016-09-16 ENCOUNTER — Other Ambulatory Visit: Payer: Self-pay | Admitting: Internal Medicine

## 2016-09-17 DIAGNOSIS — H0012 Chalazion right lower eyelid: Secondary | ICD-10-CM | POA: Diagnosis not present

## 2016-09-22 ENCOUNTER — Ambulatory Visit (INDEPENDENT_AMBULATORY_CARE_PROVIDER_SITE_OTHER): Payer: Medicare Other

## 2016-09-22 VITALS — BP 110/70 | HR 63 | Temp 97.9°F | Resp 12 | Ht 60.0 in | Wt 143.8 lb

## 2016-09-22 DIAGNOSIS — Z0000 Encounter for general adult medical examination without abnormal findings: Secondary | ICD-10-CM

## 2016-09-22 NOTE — Patient Instructions (Addendum)
Kara Dixon , Thank you for taking time to come for your Medicare Wellness Visit. I appreciate your ongoing commitment to your health goals. Please review the following plan we discussed and let me know if I can assist you in the future.   Follow up with Dr. Nicki Reaper as needed.  Merry Christmas and Happy New Year!  These are the goals we discussed: Goals    . Increase physical activity          Attend the [place] program with husband 3 days a week, 30-45 minutes (water aerobics or structured class).       This is a list of the screening recommended for you and due dates:  Health Maintenance  Topic Date Due  . Shingles Vaccine  04/30/1998  . Pneumonia vaccines (1 of 2 - PCV13) 05/01/2003  . Flu Shot  05/29/2017*  . Tetanus Vaccine  10/04/2017*  . Mammogram  10/23/2016  . DEXA scan (bone density measurement)  Completed  *Topic was postponed. The date shown is not the original due date.    Hearing Loss Introduction Hearing loss is a partial or total loss of the ability to hear. This can be temporary or permanent, and it can happen in one or both ears. Hearing loss may be referred to as deafness. Medical care is necessary to treat hearing loss properly and to prevent the condition from getting worse. Your hearing may partially or completely come back, depending on what caused your hearing loss and how severe it is. In some cases, hearing loss is permanent. What are the causes? Common causes of hearing loss include:  Too much wax in the ear canal.  Infection of the ear canal or middle ear.  Fluid in the middle ear.  Injury to the ear or surrounding area.  An object stuck in the ear.  Prolonged exposure to loud sounds, such as music. Less common causes of hearing loss include:  Tumors in the ear.  Viral or bacterial infections, such as meningitis.  A hole in the eardrum (perforated eardrum).  Problems with the hearing nerve that sends signals between the brain and  the ear.  Certain medicines. What are the signs or symptoms? Symptoms of this condition may include:  Difficulty telling the difference between sounds.  Difficulty following a conversation when there is background noise.  Lack of response to sounds in your environment. This may be most noticeable when you do not respond to startling sounds.  Needing to turn up the volume on the television, radio, etc.  Ringing in the ears.  Dizziness.  Pain in the ears. How is this diagnosed? This condition is diagnosed based on a physical exam and a hearing test (audiometry). The audiometry test will be performed by a hearing specialist (audiologist). You may also be referred to an ear, nose, and throat (ENT) specialist (otolaryngologist). How is this treated? Treatment for recent onset of hearing loss may include:  Ear wax removal.  Being prescribed medicines to prevent infection (antibiotics).  Being prescribed medicines to reduce inflammation (corticosteroids). Follow these instructions at home:  If you were prescribed an antibiotic medicine, take it as told by your health care provider. Do not stop taking the antibiotic even if you start to feel better.  Take over-the-counter and prescription medicines only as told by your health care provider.  Avoid loud noises.  Return to your normal activities as told by your health care provider. Ask your health care provider what activities are safe for you.  Keep all follow-up visits as told by your health care provider. This is important. Contact a health care provider if:  You feel dizzy.  You develop new symptoms.  You vomit or feel nauseous.  You have a fever. Get help right away if:  You develop sudden changes in your vision.  You have severe ear pain.  You have new or increased weakness.  You have a severe headache. This information is not intended to replace advice given to you by your health care provider. Make sure you  discuss any questions you have with your health care provider. Document Released: 09/21/2005 Document Revised: 02/27/2016 Document Reviewed: 02/06/2015  2017 Elsevier

## 2016-09-22 NOTE — Progress Notes (Signed)
Subjective:   Kara Dixon is a 80 y.o. female who presents for an Initial Medicare Annual Wellness Visit.  Review of Systems    No ROS.  Medicare Wellness Visit.  Cardiac Risk Factors include: advanced age (>43men, >36 women);hypertension     Objective:    Today's Vitals   09/22/16 0922  BP: 110/70  Pulse: 63  Resp: 12  Temp: 97.9 F (36.6 C)  TempSrc: Oral  SpO2: 97%  Weight: 143 lb 12.8 oz (65.2 kg)  Height: 5' (1.524 m)   Body mass index is 28.08 kg/m.   Current Medications (verified) Outpatient Encounter Prescriptions as of 09/22/2016  Medication Sig  . amLODipine (NORVASC) 5 MG tablet TAKE 1 TABLET BY MOUTH 2 TIMES DAILY.  Marland Kitchen aspirin 81 MG tablet Take 81 mg by mouth daily.  Marland Kitchen atenolol (TENORMIN) 100 MG tablet TAKE 1 TABLET (100 MG TOTAL) BY MOUTH DAILY.  . fexofenadine (ALLEGRA) 180 MG tablet Take 180 mg by mouth daily.  Marland Kitchen lactase (LACTAID) 3000 UNITS tablet Take 1 tablet by mouth as needed.  Marland Kitchen LORazepam (ATIVAN) 0.5 MG tablet TAKE 1/2 TABLET BY MOUTH AT BEDTIME AS NEEDED  . PREMARIN 0.3 MG tablet TAKE 1 TABLET (0.3 MG TOTAL) BY MOUTH DAILY.  . progesterone (PROMETRIUM) 100 MG capsule TAKE 1 CAPSULE (100 MG TOTAL) BY MOUTH DAILY.   No facility-administered encounter medications on file as of 09/22/2016.     Allergies (verified) Avelox [moxifloxacin hcl in nacl]; Mucinex [guaifenesin er]; Penicillins; and Sulfa antibiotics   History: Past Medical History:  Diagnosis Date  . Cancer ([place])    skin  . Hypercholesterolemia   . Hypertension   . Nephrolithiasis   . Osteopenia   . Vitamin D deficiency    Past Surgical History:  Procedure Laterality Date  . cyst removed under tongue  age 80   Family History  Problem Relation Age of Onset  . Stroke Mother   . Hypertension Mother   . Fibromyalgia Sister   . Breast cancer Neg Hx   . Colon cancer Neg Hx    Social History   Occupational History  . Not on file.   Social History Main Topics  . Smoking  status: Never Smoker  . Smokeless tobacco: Never Used  . Alcohol use No  . Drug use: No  . Sexual activity: Not Currently    Tobacco Counseling Counseling given: Not Answered   Activities of Daily Living In your present state of health, do you have any difficulty performing the following activities: 09/22/2016  Hearing? N  Vision? N  Difficulty concentrating or making decisions? N  Walking or climbing stairs? N  Dressing or bathing? N  Doing errands, shopping? N  Preparing Food and eating ? N  Using the Toilet? N  In the past six months, have you accidently leaked urine? N  Do you have problems with loss of bowel control? N  Managing your Medications? N  Managing your Finances? N  Housekeeping or managing your Housekeeping? N  Some recent data might be hidden    Immunizations and Health Maintenance Immunization History  Administered Date(s) Administered  . Influenza Split 07/10/2013  . Influenza,inj,Quad PF,36+ Mos 09/10/2014, 09/18/2015   Health Maintenance Due  Topic Date Due  . ZOSTAVAX  04/30/1998  . PNA vac Low Risk Adult (1 of 2 - PCV13) 05/01/2003    Patient Care Team: Einar Pheasant, MD as PCP - General (Internal Medicine)  Indicate any recent Medical Services you may have received from  other than [facility] providers in the past year (date may be approximate).     Assessment:   This is a routine wellness examination for Kara Dixon. The goal of the wellness visit is to assist the patient how to close the gaps in care and create a preventative care plan for the patient.   Osteoporosis risk reviewed.  Medications reviewed; taking without issues or barriers.  Safety issues reviewed; lives with husband.  Smoke detectors in the home. No firearms in the home. Wears seatbelts when driving or riding with others. No violence in the home.  No identified risk were noted; The patient was oriented x 3; appropriate in dress and manner and no objective failures at ADL's  or IADL's.   BMI; discussed the importance of a healthy diet, water intake and exercise. Educational material provided.  Patient Concerns: None at this time. Follow up with PCP as needed.  Hearing/Vision screen Hearing Screening Comments: Difficulty hearing a whisper Audiology deferred per patient request Educational material provided Vision Screening Comments: [facility] (Dr. Asa Lente) Last OV 09/2016 Wears glasses  Dietary issues and exercise activities discussed: Current Exercise Habits: The patient does not participate in regular exercise at present  Goals    . Increase physical activity          Attend the [place] program with husband 3 days a week, 30-45 minutes (water aerobics or structured class).      Depression Screen PHQ 2/9 Scores 09/22/2016 01/22/2016 01/08/2015 01/16/2014 01/08/2013 10/23/2012 10/17/2012  PHQ - 2 Score 0 0 0 0 0 0 0    Fall Risk Fall Risk  09/22/2016 01/22/2016 01/08/2015 01/16/2014 01/08/2013  Falls in the past year? No No No No No    Cognitive Function:     6CIT Screen 09/22/2016  What Year? 0 points  What month? 0 points  What time? 0 points  Count back from 20 0 points  Months in reverse 0 points    Screening Tests Health Maintenance  Topic Date Due  . ZOSTAVAX  04/30/1998  . PNA vac Low Risk Adult (1 of 2 - PCV13) 05/01/2003  . INFLUENZA VACCINE  05/29/2017 (Originally 05/05/2016)  . TETANUS/TDAP  10/04/2017 (Originally 04/30/1957)  . MAMMOGRAM  10/23/2016  . DEXA SCAN  Completed      Plan:    End of life planning; Advance aging; Advanced directives discussed. No HCPOA/Living Will.  Additional information declined.    Medicare Attestation I have personally reviewed: The patient's medical and social history Their use of alcohol, tobacco or illicit drugs Their current medications and supplements The patient's functional ability including ADLs,fall risks, home safety risks, cognitive, and hearing and  visual impairment Diet and physical activities Evidence for depression   The patient's weight, height, BMI, and visual acuity have been recorded in the chart.  I have made referrals and provided education to the patient based on review of the above and I have provided the patient with a written personalized care plan for preventive services.    During the course of the visit, Kara Dixon was educated and counseled about the following appropriate screening and preventive services:   Vaccines to include Pneumoccal, Influenza, Hepatitis B, Td, Zostavax, HCV  Electrocardiogram  Cardiovascular disease screening  Colorectal cancer screening  Bone density screening  Diabetes screening  Glaucoma screening  Mammography/PAP  Nutrition counseling  Smoking cessation counseling  Patient Instructions (the written plan) were given to the patient.    OBrien-Blaney, Felipe Paluch L, LPN  09/22/2016    Reviewed above information.  Agree with plan.   Dr Nicki Reaper

## 2016-09-24 ENCOUNTER — Other Ambulatory Visit: Payer: Self-pay | Admitting: Internal Medicine

## 2016-10-06 ENCOUNTER — Ambulatory Visit: Payer: Medicare Other | Admitting: Internal Medicine

## 2016-10-12 ENCOUNTER — Other Ambulatory Visit: Payer: Self-pay | Admitting: Internal Medicine

## 2016-10-22 ENCOUNTER — Other Ambulatory Visit: Payer: Self-pay | Admitting: Internal Medicine

## 2016-10-30 ENCOUNTER — Encounter: Payer: Self-pay | Admitting: Internal Medicine

## 2016-10-30 ENCOUNTER — Ambulatory Visit (INDEPENDENT_AMBULATORY_CARE_PROVIDER_SITE_OTHER): Payer: Medicare Other | Admitting: Internal Medicine

## 2016-10-30 VITALS — BP 134/60 | HR 57 | Temp 97.7°F | Ht 60.0 in | Wt 143.8 lb

## 2016-10-30 DIAGNOSIS — I10 Essential (primary) hypertension: Secondary | ICD-10-CM | POA: Diagnosis not present

## 2016-10-30 DIAGNOSIS — E7800 Pure hypercholesterolemia, unspecified: Secondary | ICD-10-CM | POA: Diagnosis not present

## 2016-10-30 DIAGNOSIS — Z1239 Encounter for other screening for malignant neoplasm of breast: Secondary | ICD-10-CM

## 2016-10-30 DIAGNOSIS — R739 Hyperglycemia, unspecified: Secondary | ICD-10-CM

## 2016-10-30 DIAGNOSIS — E559 Vitamin D deficiency, unspecified: Secondary | ICD-10-CM

## 2016-10-30 DIAGNOSIS — Z23 Encounter for immunization: Secondary | ICD-10-CM

## 2016-10-30 DIAGNOSIS — H029 Unspecified disorder of eyelid: Secondary | ICD-10-CM

## 2016-10-30 DIAGNOSIS — Z1231 Encounter for screening mammogram for malignant neoplasm of breast: Secondary | ICD-10-CM

## 2016-10-30 LAB — BASIC METABOLIC PANEL
BUN: 14 mg/dL (ref 6–23)
CHLORIDE: 106 meq/L (ref 96–112)
CO2: 27 meq/L (ref 19–32)
Calcium: 9.6 mg/dL (ref 8.4–10.5)
Creatinine, Ser: 0.98 mg/dL (ref 0.40–1.20)
GFR: 58.26 mL/min — ABNORMAL LOW (ref >60.00)
Glucose, Bld: 109 mg/dL — ABNORMAL HIGH (ref 70–99)
Potassium: 4.7 mEq/L (ref 3.5–5.1)
Sodium: 140 mEq/L (ref 135–145)

## 2016-10-30 LAB — LIPID PANEL
CHOL/HDL RATIO: 5
Cholesterol: 211 mg/dL — ABNORMAL HIGH (ref 0–200)
HDL: 45.9 mg/dL (ref >39.00)
LDL CALC: 135 mg/dL — AB (ref 0–99)
NONHDL: 165.16
TRIGLYCERIDES: 150 mg/dL — AB (ref 0.0–149.0)
VLDL: 30 mg/dL (ref 0.0–40.0)

## 2016-10-30 LAB — HEPATIC FUNCTION PANEL
ALBUMIN: 4.2 g/dL (ref 3.5–5.2)
ALK PHOS: 58 U/L (ref 39–117)
ALT: 19 U/L (ref 0–35)
AST: 25 U/L (ref 0–37)
BILIRUBIN DIRECT: 0.1 mg/dL (ref 0.0–0.3)
TOTAL PROTEIN: 7.5 g/dL (ref 6.0–8.3)
Total Bilirubin: 0.5 mg/dL (ref 0.2–1.2)

## 2016-10-30 LAB — HEMOGLOBIN A1C: Hgb A1c MFr Bld: 6.2 % (ref 4.6–6.5)

## 2016-10-30 LAB — TSH: TSH: 3.15 u[IU]/mL (ref 0.35–4.50)

## 2016-10-30 NOTE — Progress Notes (Signed)
Pre visit review using our clinic review tool, if applicable. No additional management support is needed unless otherwise documented below in the visit note. 

## 2016-10-30 NOTE — Progress Notes (Signed)
Patient ID: Kara Dixon, female   DOB: 1936-09-06, 81 y.o.   MRN: [id number]   Subjective:    Patient ID: Kara Dixon, female    DOB: 08-19-1936, 81 y.o.   MRN: [id number]  HPI  Patient here for a scheduled follow up.  States she is doing relatively well.  Still some increased stress at home. Overall handling things relatively well.  Does not feel needs anything more at this time.  Has been having problems with persistent eyelid lesion.  Seeing opthalmology.  Has been on abx and using topical creams.  Has f/u soon.  Using warm compresses.  No chest pain.  No sob.  No acid reflux.  No abdominal pain or cramping.  Bowels stable.     Past Medical History:  Diagnosis Date  . Cancer ([place])    skin  . Hypercholesterolemia   . Hypertension   . Nephrolithiasis   . Osteopenia   . Vitamin D deficiency    Past Surgical History:  Procedure Laterality Date  . cyst removed under tongue  age 74   Family History  Problem Relation Age of Onset  . Stroke Mother   . Hypertension Mother   . Fibromyalgia Sister   . Breast cancer Neg Hx   . Colon cancer Neg Hx    Social History   Social History  . Marital status: Married    Spouse name: N/A  . Number of children: 1  . Years of education: N/A   Social History Main Topics  . Smoking status: Never Smoker  . Smokeless tobacco: Never Used  . Alcohol use No  . Drug use: No  . Sexual activity: Not Currently   Other Topics Concern  . None   Social History Narrative  . None    Outpatient Encounter Prescriptions as of 10/30/2016  Medication Sig  . amLODipine (NORVASC) 5 MG tablet TAKE 1 TABLET BY MOUTH 2 TIMES DAILY.  Marland Kitchen amLODipine (NORVASC) 5 MG tablet TAKE 1 TABLET BY MOUTH 2 TIMES DAILY.  Marland Kitchen aspirin 81 MG tablet Take 81 mg by mouth daily.  Marland Kitchen atenolol (TENORMIN) 100 MG tablet TAKE 1 TABLET (100 MG TOTAL) BY MOUTH DAILY.  Marland Kitchen atenolol (TENORMIN) 50 MG tablet TAKE 2 TABLETS BY MOUTH EVERY DAY  . fexofenadine (ALLEGRA) 180 MG tablet Take 180  mg by mouth daily.  Marland Kitchen lactase (LACTAID) 3000 UNITS tablet Take 1 tablet by mouth as needed.  Marland Kitchen LORazepam (ATIVAN) 0.5 MG tablet TAKE 1/2 TABLET BY MOUTH AT BEDTIME AS NEEDED  . PREMARIN 0.3 MG tablet TAKE 1 TABLET (0.3 MG TOTAL) BY MOUTH DAILY.  . progesterone (PROMETRIUM) 100 MG capsule TAKE 1 CAPSULE (100 MG TOTAL) BY MOUTH DAILY.   No facility-administered encounter medications on file as of 10/30/2016.     Review of Systems  Constitutional: Negative for appetite change and unexpected weight change.  HENT: Negative for congestion and sinus pressure.   Eyes:       Eyelid lesion as outlined.    Respiratory: Negative for cough, chest tightness and shortness of breath.   Cardiovascular: Negative for chest pain, palpitations and leg swelling.  Gastrointestinal: Negative for abdominal pain, diarrhea, nausea and vomiting.  Genitourinary: Negative for difficulty urinating and dysuria.  Musculoskeletal: Negative for joint swelling and myalgias.  Skin: Negative for color change and rash.  Neurological: Negative for dizziness, light-headedness and headaches.  Psychiatric/Behavioral: Negative for agitation and dysphoric mood.       Objective:    Physical Exam  Constitutional: She  appears well-developed and well-nourished. No distress.  HENT:  Nose: Nose normal.  Mouth/Throat: Oropharynx is clear and moist.  Eyes:  Raised eyelid lesion - left eye  Neck: Neck supple. No thyromegaly present.  Cardiovascular: Normal rate and regular rhythm.   Pulmonary/Chest: Breath sounds normal. No respiratory distress. She has no wheezes.  Abdominal: Soft. Bowel sounds are normal. There is no tenderness.  Musculoskeletal: She exhibits no edema or tenderness.  Lymphadenopathy:    She has no cervical adenopathy.  Skin: No rash noted. No erythema.  Psychiatric: She has a normal mood and affect. Her behavior is normal.    BP 134/60   Pulse (!) 57   Temp 97.7 F (36.5 C) (Oral)   Ht 5' (1.524 m)    Wt 143 lb 12.8 oz (65.2 kg)   SpO2 98%   BMI 28.08 kg/m  Wt Readings from Last 3 Encounters:  10/30/16 143 lb 12.8 oz (65.2 kg)  09/22/16 143 lb 12.8 oz (65.2 kg)  05/29/16 142 lb 4 oz (64.5 kg)     Lab Results  Component Value Date   WBC 7.9 05/29/2016   HGB 13.9 05/29/2016   HCT 40.7 05/29/2016   PLT 265.0 05/29/2016   GLUCOSE 109 (H) 10/30/2016   CHOL 211 (H) 10/30/2016   TRIG 150.0 (H) 10/30/2016   HDL 45.90 10/30/2016   LDLDIRECT 139.4 05/12/2013   LDLCALC 135 (H) 10/30/2016   ALT 19 10/30/2016   AST 25 10/30/2016   NA 140 10/30/2016   K 4.7 10/30/2016   CL 106 10/30/2016   CREATININE 0.98 10/30/2016   BUN 14 10/30/2016   CO2 27 10/30/2016   TSH 3.15 10/30/2016   HGBA1C 6.2 10/30/2016   MICROALBUR 4.8 (H) 05/14/2014    Mm Digital Screening Bilateral  Result Date: 10/24/2015 CLINICAL DATA:  Screening. EXAM: DIGITAL SCREENING BILATERAL MAMMOGRAM WITH CAD COMPARISON:  Previous exam(s). ACR Breast Density Category b: There are scattered areas of fibroglandular density. FINDINGS: There are no findings suspicious for malignancy. Images were processed with CAD. IMPRESSION: No mammographic evidence of malignancy. A result letter of this screening mammogram will be mailed directly to the patient. RECOMMENDATION: Screening mammogram in one year. (Code:SM-B-01Y) BI-RADS CATEGORY  1: Negative. Electronically Signed   By: Franki Cabot M.D.   On: 10/24/2015 08:44       Assessment & Plan:   Problem List Items Addressed This Visit    Hypercholesterolemia   Relevant Orders   Hepatic function panel (Completed)   Lipid panel (Completed)   Hyperglycemia    Low carb diet and exercise.  Follow met b and a1c.       Relevant Orders   Hemoglobin A1c (Completed)   Hypertension - Primary    Blood pressure under good control.  Continue same medication regimen.  Follow pressures.  Follow metabolic panel.        Relevant Orders   TSH (Completed)   Basic metabolic panel  (Completed)   Vitamin D deficiency    Follow vitamin D level.         Other Visit Diagnoses    Encounter for immunization       Relevant Orders   Flu vaccine HIGH DOSE PF (Completed)   Breast cancer screening       Relevant Orders   MM DIGITAL SCREENING BILATERAL   Eyelid lesion       persistent.  seeing opthalmology.  off medication now.  did not feel she was tolerating.  warm compresses.  keep  f/u appt with opthalmology.        Einar Pheasant, MD

## 2016-11-01 ENCOUNTER — Encounter: Payer: Self-pay | Admitting: Internal Medicine

## 2016-11-01 NOTE — Assessment & Plan Note (Signed)
Follow vitamin D level.  

## 2016-11-01 NOTE — Assessment & Plan Note (Signed)
Blood pressure under good control.  Continue same medication regimen.  Follow pressures.  Follow metabolic panel.   

## 2016-11-01 NOTE — Assessment & Plan Note (Signed)
Low carb diet and exercise.  Follow met b and a1c.  

## 2016-11-02 ENCOUNTER — Encounter: Payer: Self-pay | Admitting: *Deleted

## 2016-11-05 DIAGNOSIS — H02135 Senile ectropion of left lower eyelid: Secondary | ICD-10-CM | POA: Diagnosis not present

## 2016-11-12 ENCOUNTER — Ambulatory Visit
Admission: RE | Admit: 2016-11-12 | Discharge: 2016-11-12 | Disposition: A | Discharge status: Home / Self Care | Payer: Medicare Other | Source: Ambulatory Visit | Attending: Internal Medicine | Admitting: Internal Medicine

## 2016-11-12 DIAGNOSIS — Z1239 Encounter for other screening for malignant neoplasm of breast: Secondary | ICD-10-CM

## 2016-11-12 DIAGNOSIS — Z1231 Encounter for screening mammogram for malignant neoplasm of breast: Secondary | ICD-10-CM | POA: Diagnosis not present

## 2016-12-31 DIAGNOSIS — L720 Epidermal cyst: Secondary | ICD-10-CM | POA: Diagnosis not present

## 2016-12-31 DIAGNOSIS — H02135 Senile ectropion of left lower eyelid: Secondary | ICD-10-CM | POA: Diagnosis not present

## 2016-12-31 DIAGNOSIS — H0015 Chalazion left lower eyelid: Secondary | ICD-10-CM | POA: Diagnosis not present

## 2017-01-07 ENCOUNTER — Other Ambulatory Visit: Payer: Self-pay | Admitting: Internal Medicine

## 2017-01-07 ENCOUNTER — Telehealth: Payer: Self-pay

## 2017-01-07 MED ORDER — PROGESTERONE MICRONIZED 100 MG PO CAPS: ORAL_CAPSULE | ORAL | 3 refills | Status: DC

## 2017-01-07 MED ORDER — PROGESTERONE MICRONIZED 100 MG PO CAPS: ORAL_CAPSULE | ORAL | 1 refills | Status: DC

## 2017-01-07 NOTE — Telephone Encounter (Signed)
Refill on Progesterone 100 mg capsule requested for 90 day supply

## 2017-01-07 NOTE — Telephone Encounter (Signed)
ok'd rx for progesterone #30 with 3 refills.

## 2017-01-07 NOTE — Progress Notes (Signed)
Sent in rx for progesterone #90 with one refill.

## 2017-01-07 NOTE — Telephone Encounter (Signed)
I accidentally sent in 30 day supply with refills initially.  Please cancel.  I have sent in 90 day supply with one refill.

## 2017-01-15 ENCOUNTER — Other Ambulatory Visit: Payer: Self-pay

## 2017-01-18 ENCOUNTER — Other Ambulatory Visit: Payer: Self-pay

## 2017-01-18 MED ORDER — AMLODIPINE BESYLATE 5 MG PO TABS: 5.0000 mg | ORAL_TABLET | Freq: Two times a day (BID) | ORAL | 0 refills | Status: DC

## 2017-02-02 HISTORY — PX: EYE SURGERY: SHX253

## 2017-02-05 ENCOUNTER — Telehealth: Payer: Self-pay

## 2017-02-05 NOTE — Telephone Encounter (Signed)
Fax received for refill on meds. She would like changed to 90 day supply   Medication: Premarin 0.3 mg  Directions: 1 po qd  Last given: 09/16/16 #30 Number refills: 5 Last o/v: 10/30/2016 Follow up: 03-09-17

## 2017-02-06 MED ORDER — ESTROGENS CONJUGATED 0.3 MG PO TABS: 0.3000 mg | ORAL_TABLET | Freq: Every day | ORAL | 1 refills | Status: DC

## 2017-02-06 NOTE — Telephone Encounter (Signed)
rx ok'd for 90 day supply of premarin with one refill.

## 2017-03-09 ENCOUNTER — Encounter: Payer: Self-pay | Admitting: Internal Medicine

## 2017-03-09 ENCOUNTER — Ambulatory Visit (INDEPENDENT_AMBULATORY_CARE_PROVIDER_SITE_OTHER): Payer: Medicare Other | Admitting: Internal Medicine

## 2017-03-09 VITALS — BP 130/62 | HR 55 | Temp 98.4°F | Resp 12 | Ht 60.0 in | Wt 143.8 lb

## 2017-03-09 DIAGNOSIS — I10 Essential (primary) hypertension: Secondary | ICD-10-CM | POA: Diagnosis not present

## 2017-03-09 DIAGNOSIS — R739 Hyperglycemia, unspecified: Secondary | ICD-10-CM | POA: Diagnosis not present

## 2017-03-09 DIAGNOSIS — Z79899 Other long term (current) drug therapy: Secondary | ICD-10-CM | POA: Diagnosis not present

## 2017-03-09 DIAGNOSIS — E7800 Pure hypercholesterolemia, unspecified: Secondary | ICD-10-CM | POA: Diagnosis not present

## 2017-03-09 DIAGNOSIS — Z23 Encounter for immunization: Secondary | ICD-10-CM | POA: Diagnosis not present

## 2017-03-09 LAB — LIPID PANEL
CHOL/HDL RATIO: 4
Cholesterol: 197 mg/dL (ref 0–200)
HDL: 45.6 mg/dL (ref >39.00)
LDL Cholesterol: 126 mg/dL — ABNORMAL HIGH (ref 0–99)
NONHDL: 151.15
Triglycerides: 126 mg/dL (ref 0.0–149.0)
VLDL: 25.2 mg/dL (ref 0.0–40.0)

## 2017-03-09 LAB — HEPATIC FUNCTION PANEL
ALBUMIN: 4.3 g/dL (ref 3.5–5.2)
ALK PHOS: 59 U/L (ref 39–117)
ALT: 20 U/L (ref 0–35)
AST: 29 U/L (ref 0–37)
BILIRUBIN DIRECT: 0.1 mg/dL (ref 0.0–0.3)
TOTAL PROTEIN: 7.7 g/dL (ref 6.0–8.3)
Total Bilirubin: 0.5 mg/dL (ref 0.2–1.2)

## 2017-03-09 LAB — BASIC METABOLIC PANEL
BUN: 13 mg/dL (ref 6–23)
CHLORIDE: 106 meq/L (ref 96–112)
CO2: 25 mEq/L (ref 19–32)
CREATININE: 0.95 mg/dL (ref 0.40–1.20)
Calcium: 9.4 mg/dL (ref 8.4–10.5)
GFR: 60.33 mL/min (ref >60.00)
Glucose, Bld: 114 mg/dL — ABNORMAL HIGH (ref 70–99)
POTASSIUM: 4.6 meq/L (ref 3.5–5.1)
Sodium: 139 mEq/L (ref 135–145)

## 2017-03-09 LAB — HEMOGLOBIN A1C: HEMOGLOBIN A1C: 6.5 % (ref 4.6–6.5)

## 2017-03-09 MED ORDER — LORAZEPAM 0.5 MG PO TABS: 0.2500 mg | ORAL_TABLET | Freq: Every evening | ORAL | 0 refills | Status: DC | PRN

## 2017-03-09 NOTE — Progress Notes (Signed)
Patient ID: Kara Dixon, female   DOB: 09-10-1936, 81 y.o.   MRN: [id number]   Subjective:    Patient ID: Kara Dixon, female    DOB: 1936-08-28, 81 y.o.   MRN: [id number]  HPI  Patient with past history of hypercholesterolemia and hypertension.  She comes in today to follow up on these issues as well as for a complete physical exam.  She reports she is doing well.  Handling stress.  No chest pain.  No sob.  No acid reflux.  No abdominal pain.  Bowels moving.  We again discussed her continued estrogen usage.  She declines to stop.  Is taking 5 days per week.  Will try to decrease to 4 days per week.     Past Medical History:  Diagnosis Date  . Cancer ([place])    skin  . Hypercholesterolemia   . Hypertension   . Nephrolithiasis   . Osteopenia   . Vitamin D deficiency    Past Surgical History:  Procedure Laterality Date  . cyst removed under tongue  age 61   Family History  Problem Relation Age of Onset  . Stroke Mother   . Hypertension Mother   . Fibromyalgia Sister   . Breast cancer Neg Hx   . Colon cancer Neg Hx    Social History   Social History  . Marital status: Married    Spouse name: N/A  . Number of children: 1  . Years of education: N/A   Social History Main Topics  . Smoking status: Never Smoker  . Smokeless tobacco: Never Used  . Alcohol use No  . Drug use: No  . Sexual activity: Not Currently   Other Topics Concern  . None   Social History Narrative  . None    Outpatient Encounter Prescriptions as of 03/09/2017  Medication Sig  . amLODipine (NORVASC) 5 MG tablet Take 1 tablet (5 mg total) by mouth 2 (two) times daily.  Marland Kitchen aspirin 81 MG tablet Take 81 mg by mouth daily.  Marland Kitchen atenolol (TENORMIN) 100 MG tablet TAKE 1 TABLET (100 MG TOTAL) BY MOUTH DAILY.  Marland Kitchen estrogens, conjugated, (PREMARIN) 0.3 MG tablet Take 1 tablet (0.3 mg total) by mouth daily.  . fexofenadine (ALLEGRA) 180 MG tablet Take 180 mg by mouth daily.  Marland Kitchen lactase (LACTAID) 3000 UNITS tablet  Take 1 tablet by mouth as needed.  Marland Kitchen LORazepam (ATIVAN) 0.5 MG tablet Take 0.5 tablets (0.25 mg total) by mouth at bedtime as needed.  . progesterone (PROMETRIUM) 100 MG capsule TAKE 1 CAPSULE (100 MG TOTAL) BY MOUTH DAILY.  . [DISCONTINUED] atenolol (TENORMIN) 50 MG tablet TAKE 2 TABLETS BY MOUTH EVERY DAY  . [DISCONTINUED] LORazepam (ATIVAN) 0.5 MG tablet TAKE 1/2 TABLET BY MOUTH AT BEDTIME AS NEEDED   No facility-administered encounter medications on file as of 03/09/2017.     Review of Systems  Constitutional: Negative for appetite change and unexpected weight change.  HENT: Negative for congestion and sinus pressure.   Eyes: Negative for pain and visual disturbance.  Respiratory: Negative for cough, chest tightness and shortness of breath.   Cardiovascular: Negative for chest pain, palpitations and leg swelling.  Gastrointestinal: Negative for abdominal pain, diarrhea, nausea and vomiting.  Genitourinary: Negative for difficulty urinating and dysuria.  Musculoskeletal: Negative for back pain and joint swelling.  Skin: Negative for color change and rash.  Neurological: Negative for dizziness, light-headedness and headaches.  Hematological: Negative for adenopathy. Does not bruise/bleed easily.  Psychiatric/Behavioral: Negative for agitation and dysphoric  mood.       Objective:     Blood pressure rechecked by me:  130/76  Physical Exam  Constitutional: She is oriented to person, place, and time. She appears well-developed and well-nourished. No distress.  HENT:  Nose: Nose normal.  Mouth/Throat: Oropharynx is clear and moist.  Eyes: Right eye exhibits no discharge. Left eye exhibits no discharge. No scleral icterus.  Neck: Neck supple. No thyromegaly present.  Cardiovascular: Normal rate and regular rhythm.   Pulmonary/Chest: Breath sounds normal. No accessory muscle usage. No tachypnea. No respiratory distress. She has no decreased breath sounds. She has no wheezes. She has no  rhonchi. Right breast exhibits no inverted nipple, no mass, no nipple discharge and no tenderness (no axillary adenopathy). Left breast exhibits no inverted nipple, no mass, no nipple discharge and no tenderness (no axilarry adenopathy).  Abdominal: Soft. Bowel sounds are normal. There is no tenderness.  Musculoskeletal: She exhibits no edema or tenderness.  Lymphadenopathy:    She has no cervical adenopathy.  Neurological: She is alert and oriented to person, place, and time.  Skin: Skin is warm. No rash noted. No erythema.  Psychiatric: She has a normal mood and affect. Her behavior is normal.    BP 130/62 (BP Location: Left Arm, Patient Position: Sitting, Cuff Size: Large)   Pulse (!) 55   Temp 98.4 F (36.9 C) (Oral)   Resp 12   Ht 5' (1.524 m)   Wt 143 lb 12.8 oz (65.2 kg)   SpO2 98%   BMI 28.08 kg/m  Wt Readings from Last 3 Encounters:  03/09/17 143 lb 12.8 oz (65.2 kg)  10/30/16 143 lb 12.8 oz (65.2 kg)  09/22/16 143 lb 12.8 oz (65.2 kg)     Lab Results  Component Value Date   WBC 7.9 05/29/2016   HGB 13.9 05/29/2016   HCT 40.7 05/29/2016   PLT 265.0 05/29/2016   GLUCOSE 114 (H) 03/09/2017   CHOL 197 03/09/2017   TRIG 126.0 03/09/2017   HDL 45.60 03/09/2017   LDLDIRECT 139.4 05/12/2013   LDLCALC 126 (H) 03/09/2017   ALT 20 03/09/2017   AST 29 03/09/2017   NA 139 03/09/2017   K 4.6 03/09/2017   CL 106 03/09/2017   CREATININE 0.95 03/09/2017   BUN 13 03/09/2017   CO2 25 03/09/2017   TSH 3.15 10/30/2016   HGBA1C 6.5 03/09/2017   MICROALBUR 4.8 (H) 05/14/2014    Mm Digital Screening Bilateral  Result Date: 11/12/2016 CLINICAL DATA:  Screening. EXAM: DIGITAL SCREENING BILATERAL MAMMOGRAM WITH CAD COMPARISON:  Previous exam(s). ACR Breast Density Category b: There are scattered areas of fibroglandular density. FINDINGS: There are no findings suspicious for malignancy. Images were processed with CAD. IMPRESSION: No mammographic evidence of malignancy. A result  letter of this screening mammogram will be mailed directly to the patient. RECOMMENDATION: Screening mammogram in one year. (Code:SM-B-01Y) BI-RADS CATEGORY  1: Negative. Electronically Signed   By: Pamelia Hoit M.D.   On: 11/12/2016 17:56       Assessment & Plan:   Problem List Items Addressed This Visit    Current use of estrogen therapy    Discussed with her today.  Discussed my desire to get her off the medication.  Have tried previously.  She does not feel well off the medication.  Is taking 5 days per week.  Will see if can decrease to 4 days per week.  Follow.        Hypercholesterolemia    Low cholesterol  diet and exercise.  Follow lipid panel.        Relevant Orders   Hepatic function panel (Completed)   Lipid panel (Completed)   Hyperglycemia    Low carb diet and exercise.  Follow met b and a1c.        Relevant Orders   Hemoglobin A1c (Completed)   Hypertension - Primary    Blood pressure under good control.  Continue same medication regimen.  Follow pressures.  Follow metabolic panel.        Relevant Orders   Basic metabolic panel (Completed)    Other Visit Diagnoses    Need for pneumococcal vaccination           Einar Pheasant, MD

## 2017-03-09 NOTE — Progress Notes (Signed)
Pre-visit discussion using our clinic review tool. No additional management support is needed unless otherwise documented below in the visit note.  

## 2017-03-11 ENCOUNTER — Encounter: Payer: Self-pay | Admitting: Internal Medicine

## 2017-03-11 NOTE — Assessment & Plan Note (Signed)
Low carb diet and exercise.  Follow met b and a1c.

## 2017-03-11 NOTE — Assessment & Plan Note (Signed)
Discussed with her today.  Discussed my desire to get her off the medication.  Have tried previously.  She does not feel well off the medication.  Is taking 5 days per week.  Will see if can decrease to 4 days per week.  Follow.

## 2017-03-11 NOTE — Assessment & Plan Note (Signed)
Blood pressure under good control.  Continue same medication regimen.  Follow pressures.  Follow metabolic panel.   

## 2017-03-11 NOTE — Assessment & Plan Note (Signed)
Low cholesterol diet and exercise.  Follow lipid panel.   

## 2017-04-01 ENCOUNTER — Other Ambulatory Visit: Payer: Self-pay | Admitting: Internal Medicine

## 2017-04-25 ENCOUNTER — Other Ambulatory Visit: Payer: Self-pay | Admitting: Internal Medicine

## 2017-06-22 ENCOUNTER — Telehealth: Payer: Self-pay

## 2017-06-22 ENCOUNTER — Other Ambulatory Visit: Payer: Self-pay

## 2017-06-22 MED ORDER — ESTROGENS CONJUGATED 0.3 MG PO TABS: 0.3000 mg | ORAL_TABLET | Freq: Every day | ORAL | 0 refills | Status: DC

## 2017-06-22 MED ORDER — PROGESTERONE MICRONIZED 100 MG PO CAPS: ORAL_CAPSULE | ORAL | 1 refills | Status: DC

## 2017-06-22 NOTE — Telephone Encounter (Signed)
Order faxed.

## 2017-06-22 NOTE — Telephone Encounter (Signed)
Premarin 0.3mg  Take 1 tab by mouth daily  Next O/V- 07/09/17 Last O/V- 03/09/17 Last refill- 02/06/17 #90 1 refill

## 2017-06-22 NOTE — Telephone Encounter (Signed)
ok'd premarin #[phone] with on refills.

## 2017-07-09 ENCOUNTER — Encounter: Payer: Medicare Other | Admitting: Internal Medicine

## 2017-08-31 ENCOUNTER — Other Ambulatory Visit: Payer: Self-pay | Admitting: *Deleted

## 2017-08-31 MED ORDER — ATENOLOL 50 MG PO TABS: 100.0000 mg | ORAL_TABLET | Freq: Every day | ORAL | 0 refills | Status: DC

## 2017-09-08 ENCOUNTER — Other Ambulatory Visit: Payer: Self-pay | Admitting: *Deleted

## 2017-09-08 MED ORDER — ESTROGENS CONJUGATED 0.3 MG PO TABS: 0.3000 mg | ORAL_TABLET | Freq: Every day | ORAL | 0 refills | Status: DC

## 2017-09-10 ENCOUNTER — Encounter: Payer: Self-pay | Admitting: Internal Medicine

## 2017-09-10 ENCOUNTER — Ambulatory Visit (INDEPENDENT_AMBULATORY_CARE_PROVIDER_SITE_OTHER): Payer: Medicare Other | Admitting: Internal Medicine

## 2017-09-10 VITALS — BP 125/86 | HR 50 | Temp 97.7°F | Resp 16 | Ht 60.24 in | Wt 140.5 lb

## 2017-09-10 DIAGNOSIS — I10 Essential (primary) hypertension: Secondary | ICD-10-CM

## 2017-09-10 DIAGNOSIS — Z23 Encounter for immunization: Secondary | ICD-10-CM | POA: Diagnosis not present

## 2017-09-10 DIAGNOSIS — Z79899 Other long term (current) drug therapy: Secondary | ICD-10-CM | POA: Diagnosis not present

## 2017-09-10 DIAGNOSIS — Z1239 Encounter for other screening for malignant neoplasm of breast: Secondary | ICD-10-CM

## 2017-09-10 DIAGNOSIS — Z1231 Encounter for screening mammogram for malignant neoplasm of breast: Secondary | ICD-10-CM

## 2017-09-10 DIAGNOSIS — R251 Tremor, unspecified: Secondary | ICD-10-CM

## 2017-09-10 DIAGNOSIS — E559 Vitamin D deficiency, unspecified: Secondary | ICD-10-CM

## 2017-09-10 DIAGNOSIS — Z0000 Encounter for general adult medical examination without abnormal findings: Secondary | ICD-10-CM

## 2017-09-10 DIAGNOSIS — R739 Hyperglycemia, unspecified: Secondary | ICD-10-CM | POA: Diagnosis not present

## 2017-09-10 DIAGNOSIS — E7800 Pure hypercholesterolemia, unspecified: Secondary | ICD-10-CM | POA: Diagnosis not present

## 2017-09-10 LAB — CBC WITH DIFFERENTIAL/PLATELET
BASOS ABS: 0.1 10*3/uL (ref 0.0–0.1)
Basophils Relative: 1.1 % (ref 0.0–3.0)
EOS ABS: 0.4 10*3/uL (ref 0.0–0.7)
Eosinophils Relative: 3.8 % (ref 0.0–5.0)
HCT: 42.1 % (ref 36.0–46.0)
Hemoglobin: 13.8 g/dL (ref 12.0–15.0)
LYMPHS ABS: 3.3 10*3/uL (ref 0.7–4.0)
Lymphocytes Relative: 29.3 % (ref 12.0–46.0)
MCHC: 32.8 g/dL (ref 30.0–36.0)
MCV: 95.9 fl (ref 78.0–100.0)
Monocytes Absolute: 0.9 10*3/uL (ref 0.1–1.0)
Monocytes Relative: 7.9 % (ref 3.0–12.0)
NEUTROS ABS: 6.4 10*3/uL (ref 1.4–7.7)
NEUTROS PCT: 57.9 % (ref 43.0–77.0)
PLATELETS: 286 10*3/uL (ref 150.0–400.0)
RBC: 4.39 Mil/uL (ref 3.87–5.11)
RDW: 12.2 % (ref 11.5–15.5)
WBC: 11.1 10*3/uL — ABNORMAL HIGH (ref 4.0–10.5)

## 2017-09-10 LAB — LIPID PANEL
Cholesterol: 180 mg/dL (ref 0–200)
HDL: 40.3 mg/dL (ref >39.00)
LDL Cholesterol: 113 mg/dL — ABNORMAL HIGH (ref 0–99)
NONHDL: 139.26
Total CHOL/HDL Ratio: 4
Triglycerides: 130 mg/dL (ref 0.0–149.0)
VLDL: 26 mg/dL (ref 0.0–40.0)

## 2017-09-10 LAB — HEMOGLOBIN A1C: Hgb A1c MFr Bld: 6.2 % (ref 4.6–6.5)

## 2017-09-10 LAB — BASIC METABOLIC PANEL
BUN: 16 mg/dL (ref 6–23)
CHLORIDE: 105 meq/L (ref 96–112)
CO2: 27 mEq/L (ref 19–32)
CREATININE: 1.03 mg/dL (ref 0.40–1.20)
Calcium: 9.3 mg/dL (ref 8.4–10.5)
GFR: 54.89 mL/min — AB (ref >60.00)
Glucose, Bld: 111 mg/dL — ABNORMAL HIGH (ref 70–99)
POTASSIUM: 4.5 meq/L (ref 3.5–5.1)
Sodium: 139 mEq/L (ref 135–145)

## 2017-09-10 LAB — HEPATIC FUNCTION PANEL
ALBUMIN: 4.3 g/dL (ref 3.5–5.2)
ALK PHOS: 58 U/L (ref 39–117)
ALT: 18 U/L (ref 0–35)
AST: 22 U/L (ref 0–37)
Bilirubin, Direct: 0.1 mg/dL (ref 0.0–0.3)
TOTAL PROTEIN: 7.4 g/dL (ref 6.0–8.3)
Total Bilirubin: 0.5 mg/dL (ref 0.2–1.2)

## 2017-09-10 LAB — VITAMIN D 25 HYDROXY (VIT D DEFICIENCY, FRACTURES): VITD: 19.73 ng/mL — AB (ref 30.00–100.00)

## 2017-09-10 LAB — TSH: TSH: 2.86 u[IU]/mL (ref 0.35–4.50)

## 2017-09-10 MED ORDER — LORAZEPAM 0.5 MG PO TABS: 0.2500 mg | ORAL_TABLET | Freq: Every evening | ORAL | 0 refills | Status: DC | PRN

## 2017-09-10 NOTE — Assessment & Plan Note (Signed)
Physical today 09/10/17.  Mammogram 11/12/16 - birads I.  Colonoscopy 03/02/13.

## 2017-09-10 NOTE — Progress Notes (Signed)
Patient ID: Kara Dixon, female   DOB: 03/23/1936, 81 y.o.   MRN: [id number]   Subjective:    Patient ID: Kara Dixon, female    DOB: [birth year], 81 y.o.   MRN: [id number]  HPI  Patient with past history of hypercholesterolemia and hypertension who comes in today to follow up on these issues as well as for a complete physical exam.  States she is doing relatively well.  States handling stress relatively well.  No chest pain.  Tries to stay active.  No sob.  No acid reflux.  No abdominal pain.  Bowels moving.  Has noticed a mild tremor (hands).  disucssed with her today.  Desires no further testing or evaluation.  No other neurological changes.  No gait disturbance.     Past Medical History:  Diagnosis Date  . Cancer ([place])    skin  . Hypercholesterolemia   . Hypertension   . Nephrolithiasis   . Osteopenia   . Vitamin D deficiency    Past Surgical History:  Procedure Laterality Date  . cyst removed under tongue  age 62   Family History  Problem Relation Age of Onset  . Stroke Mother   . Hypertension Mother   . Fibromyalgia Sister   . Breast cancer Neg Hx   . Colon cancer Neg Hx    Social History   Socioeconomic History  . Marital status: Married    Spouse name: None  . Number of children: 1  . Years of education: None  . Highest education level: None  Social Needs  . Financial resource strain: None  . Food insecurity - worry: None  . Food insecurity - inability: None  . Transportation needs - medical: None  . Transportation needs - non-medical: None  Occupational History  . None  Tobacco Use  . Smoking status: Never Smoker  . Smokeless tobacco: Never Used  Substance and Sexual Activity  . Alcohol use: No    Alcohol/week: 0.0 oz  . Drug use: No  . Sexual activity: Not Currently  Other Topics Concern  . None  Social History Narrative  . None    Outpatient Encounter Medications as of 09/10/2017  Medication Sig  . amLODipine (NORVASC) 5 MG tablet TAKE 1  TABLET (5 MG TOTAL) BY MOUTH 2 (TWO) TIMES DAILY.  Marland Kitchen aspirin 81 MG tablet Take 81 mg by mouth daily.  Marland Kitchen atenolol (TENORMIN) 100 MG tablet TAKE 1 TABLET (100 MG TOTAL) BY MOUTH DAILY.  Marland Kitchen estrogens, conjugated, (PREMARIN) 0.3 MG tablet Take 1 tablet (0.3 mg total) by mouth daily.  . fexofenadine (ALLEGRA) 180 MG tablet Take 180 mg by mouth as needed.   . lactase (LACTAID) 3000 UNITS tablet Take 1 tablet by mouth as needed.  Marland Kitchen LORazepam (ATIVAN) 0.5 MG tablet Take 0.5 tablets (0.25 mg total) by mouth at bedtime as needed.  . progesterone (PROMETRIUM) 100 MG capsule TAKE 1 CAPSULE (100 MG TOTAL) BY MOUTH DAILY.  . [DISCONTINUED] LORazepam (ATIVAN) 0.5 MG tablet Take 0.5 tablets (0.25 mg total) by mouth at bedtime as needed.  . [DISCONTINUED] atenolol (TENORMIN) 50 MG tablet Take 2 tablets (100 mg total) by mouth daily.   No facility-administered encounter medications on file as of 09/10/2017.     Review of Systems  Constitutional: Negative for appetite change and unexpected weight change.  HENT: Negative for congestion and sinus pressure.   Eyes: Negative for pain and visual disturbance.  Respiratory: Negative for cough, chest tightness and shortness of breath.   Cardiovascular:  Negative for chest pain, palpitations and leg swelling.  Gastrointestinal: Negative for abdominal pain, diarrhea, nausea and vomiting.  Genitourinary: Negative for difficulty urinating and dysuria.  Musculoskeletal: Negative for back pain and joint swelling.  Skin: Negative for color change and rash.  Neurological: Negative for dizziness, light-headedness and headaches.       Mild tremor as outlined.  No weakness.  No gait disturbance.   Hematological: Negative for adenopathy. Does not bruise/bleed easily.  Psychiatric/Behavioral: Negative for agitation and dysphoric mood.       Objective:     Blood pressure rechecked by me:  138/82  Physical Exam  Constitutional: She is oriented to person, place, and time. She  appears well-developed and well-nourished. No distress.  HENT:  Nose: Nose normal.  Mouth/Throat: Oropharynx is clear and moist.  Eyes: Right eye exhibits no discharge. Left eye exhibits no discharge. No scleral icterus.  Neck: Neck supple. No thyromegaly present.  Cardiovascular: Normal rate and regular rhythm.  Pulmonary/Chest: Breath sounds normal. No accessory muscle usage. No tachypnea. No respiratory distress. She has no decreased breath sounds. She has no wheezes. She has no rhonchi. Right breast exhibits no inverted nipple, no mass, no nipple discharge and no tenderness (no axillary adenopathy). Left breast exhibits no inverted nipple, no mass, no nipple discharge and no tenderness (no axilarry adenopathy).  Abdominal: Soft. Bowel sounds are normal. There is no tenderness.  Musculoskeletal: She exhibits no edema or tenderness.  Lymphadenopathy:    She has no cervical adenopathy.  Neurological: She is alert and oriented to person, place, and time.  Mild hand tremor.  Motor strength - equal and normal.  No cog wheeling.  Gait wnl.   Skin: Skin is warm. No rash noted. No erythema.  Psychiatric: She has a normal mood and affect. Her behavior is normal.    BP 125/86   Pulse (!) 50   Temp 97.7 F (36.5 C)   Resp 16   Ht 5' 0.24" (1.53 m)   Wt 140 lb 8 oz (63.7 kg)   SpO2 97%   BMI 27.22 kg/m  Wt Readings from Last 3 Encounters:  09/10/17 140 lb 8 oz (63.7 kg)  03/09/17 143 lb 12.8 oz (65.2 kg)  10/30/16 143 lb 12.8 oz (65.2 kg)     Lab Results  Component Value Date   WBC 11.1 (H) 09/10/2017   HGB 13.8 09/10/2017   HCT 42.1 09/10/2017   PLT 286.0 09/10/2017   GLUCOSE 111 (H) 09/10/2017   CHOL 180 09/10/2017   TRIG 130.0 09/10/2017   HDL 40.30 09/10/2017   LDLDIRECT 139.4 05/12/2013   LDLCALC 113 (H) 09/10/2017   ALT 18 09/10/2017   AST 22 09/10/2017   NA 139 09/10/2017   K 4.5 09/10/2017   CL 105 09/10/2017   CREATININE 1.03 09/10/2017   BUN 16 09/10/2017   CO2  27 09/10/2017   TSH 2.86 09/10/2017   HGBA1C 6.2 09/10/2017   MICROALBUR 4.8 (H) 05/14/2014    Mm Digital Screening Bilateral  Result Date: 11/12/2016 CLINICAL DATA:  Screening. EXAM: DIGITAL SCREENING BILATERAL MAMMOGRAM WITH CAD COMPARISON:  Previous exam(s). ACR Breast Density Category b: There are scattered areas of fibroglandular density. FINDINGS: There are no findings suspicious for malignancy. Images were processed with CAD. IMPRESSION: No mammographic evidence of malignancy. A result letter of this screening mammogram will be mailed directly to the patient. RECOMMENDATION: Screening mammogram in one year. (Code:SM-B-01Y) BI-RADS CATEGORY  1: Negative. Electronically Signed   By: Pamelia Hoit  M.D.   On: 11/12/2016 17:56       Assessment & Plan:   Problem List Items Addressed This Visit    Current use of estrogen therapy    Have discussed my desire to get her off estrogen.  Have tried previously.  She does not feel well off the medication.  Is trying to gradually decrease the dose.  Follow.        Health care maintenance    Physical today 09/10/17.  Mammogram 11/12/16 - birads I.  Colonoscopy 03/02/13.        Hypercholesterolemia    Low cholesterol diet and exercise.  Follow lipid panel.        Relevant Orders   Hepatic function panel (Completed)   Lipid panel (Completed)   Hyperglycemia    Low carb diet and exercise.  Follow met b and a1c.        Relevant Orders   Hemoglobin A1c (Completed)   Hypertension - Primary    Blood pressure on recheck as outlined.  Same medication regimen.  Follow pressures.  Follow metabolic panel.        Relevant Orders   CBC with Differential/Platelet (Completed)   TSH (Completed)   Basic metabolic panel (Completed)   Tremor    Minimal tremor - hands.  Exam as outlined.  Discussed further w/up/evaluation with pt.  She declines.  Follow.        Vitamin D deficiency    Follow vitamin D level.        Relevant Orders   VITAMIN D 25  Hydroxy (Vit-D Deficiency, Fractures) (Completed)    Other Visit Diagnoses    Breast cancer screening       Relevant Orders   MM DIGITAL SCREENING BILATERAL   Need for influenza vaccination       Relevant Orders   Flu vaccine HIGH DOSE PF (Completed)       Einar Pheasant, MD

## 2017-09-13 ENCOUNTER — Other Ambulatory Visit: Payer: Self-pay | Admitting: Internal Medicine

## 2017-09-13 ENCOUNTER — Encounter: Payer: Self-pay | Admitting: Internal Medicine

## 2017-09-13 DIAGNOSIS — E7800 Pure hypercholesterolemia, unspecified: Secondary | ICD-10-CM

## 2017-09-13 DIAGNOSIS — R251 Tremor, unspecified: Secondary | ICD-10-CM | POA: Insufficient documentation

## 2017-09-13 DIAGNOSIS — D72829 Elevated white blood cell count, unspecified: Secondary | ICD-10-CM

## 2017-09-13 DIAGNOSIS — R7989 Other specified abnormal findings of blood chemistry: Secondary | ICD-10-CM

## 2017-09-13 NOTE — Assessment & Plan Note (Signed)
Minimal tremor - hands.  Exam as outlined.  Discussed further w/up/evaluation with pt.  She declines.  Follow.

## 2017-09-13 NOTE — Assessment & Plan Note (Signed)
Low cholesterol diet and exercise.  Follow lipid panel.   

## 2017-09-13 NOTE — Assessment & Plan Note (Signed)
Blood pressure on recheck as outlined.  Same medication regimen.  Follow pressures.  Follow metabolic panel.

## 2017-09-13 NOTE — Assessment & Plan Note (Signed)
Low carb diet and exercise.  Follow met b and a1c.   

## 2017-09-13 NOTE — Assessment & Plan Note (Signed)
Have discussed my desire to get her off estrogen.  Have tried previously.  She does not feel well off the medication.  Is trying to gradually decrease the dose.  Follow.

## 2017-09-13 NOTE — Assessment & Plan Note (Signed)
Follow vitamin D level.  

## 2017-09-13 NOTE — Progress Notes (Signed)
Orders placed for f/u labs.  

## 2017-09-22 ENCOUNTER — Encounter: Payer: Self-pay | Admitting: *Deleted

## 2017-09-23 ENCOUNTER — Ambulatory Visit (INDEPENDENT_AMBULATORY_CARE_PROVIDER_SITE_OTHER): Payer: Medicare Other

## 2017-09-23 VITALS — BP 120/70 | HR 50 | Temp 97.8°F | Resp 14 | Ht 60.24 in | Wt 141.0 lb

## 2017-09-23 DIAGNOSIS — Z1331 Encounter for screening for depression: Secondary | ICD-10-CM

## 2017-09-23 DIAGNOSIS — Z0000 Encounter for general adult medical examination without abnormal findings: Secondary | ICD-10-CM | POA: Diagnosis not present

## 2017-09-23 NOTE — Patient Instructions (Addendum)
  Kara Dixon , It was so good to see you! Thank you for taking time to come for your Medicare Wellness Visit. I appreciate your ongoing commitment to your health goals. Please review the following plan we discussed and let me know if I can assist you in the future.   Follow up with Dr. Nicki Reaper as needed.    Bring a copy of your [place] and/or Living Will to be scanned into chart once completed.  Have a great day and Merry Christmas!   These are the goals we discussed: Goals    . Increase physical activity     Walk for exercise        This is a list of the screening recommended for you and due dates:  Health Maintenance  Topic Date Due  . Tetanus Vaccine  10/04/2017*  . Mammogram  11/12/2017  . Pneumonia vaccines (2 of 2 - PPSV23) 03/09/2018  . Flu Shot  Completed  . DEXA scan (bone density measurement)  Completed  *Topic was postponed. The date shown is not the original due date.

## 2017-09-23 NOTE — Progress Notes (Signed)
Subjective:   Kara Dixon is a 81 y.o. female who presents for Medicare Annual (Subsequent) preventive examination.  Review of Systems:  No ROS.  Medicare Wellness Visit. Additional risk factors are reflected in the social history.  Cardiac Risk Factors include: advanced age (>24men, >82 women);hypertension     Objective:     Vitals: BP 120/70 (BP Location: Left Arm, Patient Position: Sitting, Cuff Size: Normal)   Pulse (!) 50   Temp 97.8 F (36.6 C) (Oral)   Resp 14   Ht 5' 0.24" (1.53 m)   Wt 141 lb (64 kg)   SpO2 98%   BMI 27.32 kg/m   Body mass index is 27.32 kg/m.  Advanced Directives 09/23/2017 09/22/2016  Does Patient Have a Medical Advance Directive? No No  Would patient like information on creating a medical advance directive? Yes (MAU/Ambulatory/Procedural Areas - Information given) No - Patient declined    Tobacco Social History   Tobacco Use  Smoking Status Never Smoker  Smokeless Tobacco Never Used     Counseling given: Not Answered   Clinical Intake:  Pre-visit preparation completed: Yes  Pain : No/denies pain     Nutritional Status: BMI 25 -29 Overweight Diabetes: No  How often do you need to have someone help you when you read instructions, pamphlets, or other written materials from your doctor or pharmacy?: 1 - Never  Interpreter Needed?: No     Past Medical History:  Diagnosis Date  . Cancer ([place])    skin  . Hypercholesterolemia   . Hypertension   . Nephrolithiasis   . Osteopenia   . Vitamin D deficiency    Past Surgical History:  Procedure Laterality Date  . cyst removed under tongue  age 26  . EYE SURGERY  02/2017   EYE LID; care everywhere   Family History  Problem Relation Age of Onset  . Stroke Mother   . Hypertension Mother   . Fibromyalgia Sister   . Breast cancer Neg Hx   . Colon cancer Neg Hx    Social History   Socioeconomic History  . Marital status: Married    Spouse name: None  . Number of  children: 1  . Years of education: None  . Highest education level: None  Social Needs  . Financial resource strain: None  . Food insecurity - worry: Never true  . Food insecurity - inability: Never true  . Transportation needs - medical: No  . Transportation needs - non-medical: No  Occupational History  . None  Tobacco Use  . Smoking status: Never Smoker  . Smokeless tobacco: Never Used  Substance and Sexual Activity  . Alcohol use: No    Alcohol/week: 0.0 oz  . Drug use: No  . Sexual activity: Not Currently  Other Topics Concern  . None  Social History Narrative  . None    Outpatient Encounter Medications as of 09/23/2017  Medication Sig  . amLODipine (NORVASC) 5 MG tablet TAKE 1 TABLET (5 MG TOTAL) BY MOUTH 2 (TWO) TIMES DAILY.  Marland Kitchen aspirin 81 MG tablet Take 81 mg by mouth daily.  Marland Kitchen atenolol (TENORMIN) 100 MG tablet TAKE 1 TABLET (100 MG TOTAL) BY MOUTH DAILY.  Marland Kitchen estrogens, conjugated, (PREMARIN) 0.3 MG tablet Take 1 tablet (0.3 mg total) by mouth daily.  . fexofenadine (ALLEGRA) 180 MG tablet Take 180 mg by mouth as needed.   . lactase (LACTAID) 3000 UNITS tablet Take 1 tablet by mouth as needed.  Marland Kitchen LORazepam (ATIVAN) 0.5 MG  tablet Take 0.5 tablets (0.25 mg total) by mouth at bedtime as needed.  . progesterone (PROMETRIUM) 100 MG capsule TAKE 1 CAPSULE (100 MG TOTAL) BY MOUTH DAILY.   No facility-administered encounter medications on file as of 09/23/2017.     Activities of Daily Living In your present state of health, do you have any difficulty performing the following activities: 09/23/2017  Hearing? N  Vision? N  Difficulty concentrating or making decisions? N  Walking or climbing stairs? N  Dressing or bathing? N  Doing errands, shopping? N  Preparing Food and eating ? N  Using the Toilet? N  In the past six months, have you accidently leaked urine? N  Do you have problems with loss of bowel control? N  Managing your Medications? N  Managing your Finances? N   Housekeeping or managing your Housekeeping? N  Some recent data might be hidden    Patient Care Team: Einar Pheasant, MD as PCP - General (Internal Medicine)    Assessment:   This is a routine wellness examination for Kara Dixon.  The goal of the wellness visit is to assist the patient how to close the gaps in care and create a preventative care plan for the patient.   The roster of all physicians providing medical care to patient is listed in the Snapshot section of the chart.  Osteoporosis risk reviewed.    Safety issues reviewed; Smoke and carbon monoxide detectors in the home. No firearms in the home.  Wears seatbelts when driving or riding with others.Patient does wear sunscreen or protective clothing when in direct sunlight. No violence in the home.  Depression- PHQ 2 &9 complete.  No signs/symptoms or verbal communication regarding little pleasure in doing things, feeling down, depressed or hopeless. No changes in sleeping, energy, eating, concentrating.  No thoughts of self harm or harm towards others.  Time spent on this topic is 10 minutes.   Patient is alert, normal appearance, oriented to person/place/and time. Correctly identified the president of the [place], recall of 3/3 words, and performing simple calculations. Displays appropriate judgement and can read correct time from watch face.   No new identified risk were noted.  No failures at ADL's or IADL's.    BMI- discussed the importance of a healthy diet, water intake and the benefits of aerobic exercise. Educational material provided.   24 hour diet recall: Breakfast: toast, jelly Lunch: none Dinner: pasta, chicken noodle soup  Daily fluid intake: 0 cups of caffeine, 6 cups of water  Dental- every 6 months.  Dr. Jacobo Forest.  Eye- Visual acuity not assessed per patient preference since they have regular follow up with the ophthalmologist.  Wears corrective lenses.  Sleep patterns- Sleeps 7-9 hours at night.  Wakes  feeling rested.   Health maintenance gaps- closed.  Patient Concerns: None at this time. Follow up with PCP as needed.  Exercise Activities and Dietary recommendations Current Exercise Habits: The patient does not participate in regular exercise at present  Goals    . Increase physical activity     Walk for exercise        Fall Risk Fall Risk  09/23/2017 09/22/2016 01/22/2016 01/08/2015 01/16/2014  Falls in the past year? No No No No No     Depression Screen PHQ 2/9 Scores 09/23/2017 09/22/2016 01/22/2016 01/08/2015  PHQ - 2 Score 0 0 0 0  PHQ- 9 Score 0 - - -     Cognitive Function     6CIT Screen 09/23/2017 09/22/2016  What  Year? 0 points 0 points  What month? 0 points 0 points  What time? 0 points 0 points  Count back from 20 0 points 0 points  Months in reverse 0 points 0 points  Repeat phrase 0 points -  Total Score 0 -    Immunization History  Administered Date(s) Administered  . Influenza Split 07/10/2013  . Influenza, High Dose Seasonal PF 10/30/2016, 09/10/2017  . Influenza,inj,Quad PF,6+ Mos 09/10/2014, 09/18/2015  . Pneumococcal Conjugate-13 03/09/2017     Screening Tests Health Maintenance  Topic Date Due  . TETANUS/TDAP  10/04/2017 (Originally 04/30/1955)  . MAMMOGRAM  11/12/2017  . PNA vac Low Risk Adult (2 of 2 - PPSV23) 03/09/2018  . INFLUENZA VACCINE  Completed  . DEXA SCAN  Completed       Plan:   End of life planning; Advanced aging; Advanced directives discussed.  No HCPOA/Living Will.  Additional information provided to help them start the conversation with family.  Copy of HCPOA/Living Will requested upon completion. Time spent on this topic is 16 minutes.  I have personally reviewed and noted the following in the patient's chart:   . Medical and social history . Use of alcohol, tobacco or illicit drugs  . Current medications and supplements . Functional ability and status . Nutritional status . Physical activity . Advanced  directives . List of other physicians . Hospitalizations, surgeries, and ER visits in previous 12 months . Vitals . Screenings to include cognitive, depression, and falls . Referrals and appointments  In addition, I have reviewed and discussed with patient certain preventive protocols, quality metrics, and best practice recommendations. A written personalized care plan for preventive services as well as general preventive health recommendations were provided to patient.     Varney Biles, LPN  [id number]/12/4740   Reviewed above information.  Agree with assessment and plan.   Dr Nicki Reaper

## 2017-10-06 ENCOUNTER — Telehealth: Payer: Self-pay | Admitting: Internal Medicine

## 2017-10-06 DIAGNOSIS — E785 Hyperlipidemia, unspecified: Secondary | ICD-10-CM

## 2017-10-06 MED ORDER — ROSUVASTATIN CALCIUM 10 MG PO TABS: 10.0000 mg | ORAL_TABLET | Freq: Every day | ORAL | 0 refills | Status: DC

## 2017-10-06 NOTE — Telephone Encounter (Signed)
Ok.  Thank you.  She will need a liver panel checked in 6 weeks.  Please schedule her for a non fasting lab appt in 6 weeks.

## 2017-10-06 NOTE — Telephone Encounter (Signed)
Crestor sent in to [facility] in [place]. Attempted to reach patient. Left voicemail to call back

## 2017-10-06 NOTE — Telephone Encounter (Signed)
Pt stated that she received a letter about her labs and would like to start the Crestor10mg  please send to [facility]

## 2017-10-07 NOTE — Telephone Encounter (Signed)
Attempted to call patient again. LMTCB.

## 2017-10-29 ENCOUNTER — Other Ambulatory Visit: Payer: Self-pay

## 2017-10-29 MED ORDER — AMLODIPINE BESYLATE 5 MG PO TABS: 5.0000 mg | ORAL_TABLET | Freq: Two times a day (BID) | ORAL | 1 refills | Status: DC

## 2017-11-16 ENCOUNTER — Ambulatory Visit
Admission: RE | Admit: 2017-11-16 | Discharge: 2017-11-16 | Disposition: A | Discharge status: Home / Self Care | Payer: Medicare Other | Source: Ambulatory Visit | Attending: Internal Medicine | Admitting: Internal Medicine

## 2017-11-16 DIAGNOSIS — Z1239 Encounter for other screening for malignant neoplasm of breast: Secondary | ICD-10-CM

## 2017-11-16 DIAGNOSIS — Z1231 Encounter for screening mammogram for malignant neoplasm of breast: Secondary | ICD-10-CM | POA: Insufficient documentation

## 2017-12-09 ENCOUNTER — Other Ambulatory Visit: Payer: Self-pay | Admitting: Internal Medicine

## 2017-12-16 ENCOUNTER — Other Ambulatory Visit (INDEPENDENT_AMBULATORY_CARE_PROVIDER_SITE_OTHER): Payer: Medicare Other

## 2017-12-16 DIAGNOSIS — R7989 Other specified abnormal findings of blood chemistry: Secondary | ICD-10-CM | POA: Diagnosis not present

## 2017-12-16 DIAGNOSIS — E7800 Pure hypercholesterolemia, unspecified: Secondary | ICD-10-CM | POA: Diagnosis not present

## 2017-12-16 DIAGNOSIS — D72829 Elevated white blood cell count, unspecified: Secondary | ICD-10-CM

## 2017-12-16 LAB — CBC WITH DIFFERENTIAL/PLATELET
BASOS ABS: 0.1 10*3/uL (ref 0.0–0.1)
Basophils Relative: 1 % (ref 0.0–3.0)
EOS PCT: 6 % — AB (ref 0.0–5.0)
Eosinophils Absolute: 0.7 10*3/uL (ref 0.0–0.7)
HEMATOCRIT: 37.9 % (ref 36.0–46.0)
HEMOGLOBIN: 12.9 g/dL (ref 12.0–15.0)
LYMPHS ABS: 3.9 10*3/uL (ref 0.7–4.0)
LYMPHS PCT: 34.9 % (ref 12.0–46.0)
MCHC: 34.2 g/dL (ref 30.0–36.0)
MCV: 92.1 fl (ref 78.0–100.0)
MONOS PCT: 9.9 % (ref 3.0–12.0)
Monocytes Absolute: 1.1 10*3/uL — ABNORMAL HIGH (ref 0.1–1.0)
Neutro Abs: 5.3 10*3/uL (ref 1.4–7.7)
Neutrophils Relative %: 48.2 % (ref 43.0–77.0)
Platelets: 286 10*3/uL (ref 150.0–400.0)
RBC: 4.11 Mil/uL (ref 3.87–5.11)
RDW: 11.9 % (ref 11.5–15.5)
WBC: 11.1 10*3/uL — AB (ref 4.0–10.5)

## 2017-12-16 LAB — BASIC METABOLIC PANEL
BUN: 12 mg/dL (ref 6–23)
CALCIUM: 9.3 mg/dL (ref 8.4–10.5)
CO2: 27 mEq/L (ref 19–32)
CREATININE: 1.05 mg/dL (ref 0.40–1.20)
Chloride: 106 mEq/L (ref 96–112)
GFR: 53.65 mL/min — AB (ref >60.00)
GLUCOSE: 137 mg/dL — AB (ref 70–99)
Potassium: 4.4 mEq/L (ref 3.5–5.1)
SODIUM: 139 meq/L (ref 135–145)

## 2017-12-16 LAB — HEPATIC FUNCTION PANEL
ALBUMIN: 3.9 g/dL (ref 3.5–5.2)
ALK PHOS: 52 U/L (ref 39–117)
ALT: 14 U/L (ref 0–35)
AST: 16 U/L (ref 0–37)
Bilirubin, Direct: 0.1 mg/dL (ref 0.0–0.3)
TOTAL PROTEIN: 7.2 g/dL (ref 6.0–8.3)
Total Bilirubin: 0.5 mg/dL (ref 0.2–1.2)

## 2017-12-17 ENCOUNTER — Other Ambulatory Visit (INDEPENDENT_AMBULATORY_CARE_PROVIDER_SITE_OTHER): Payer: Medicare Other

## 2017-12-17 ENCOUNTER — Other Ambulatory Visit: Payer: Self-pay | Admitting: Internal Medicine

## 2017-12-17 DIAGNOSIS — E7800 Pure hypercholesterolemia, unspecified: Secondary | ICD-10-CM | POA: Diagnosis not present

## 2017-12-17 DIAGNOSIS — R739 Hyperglycemia, unspecified: Secondary | ICD-10-CM

## 2017-12-17 LAB — LIPID PANEL
CHOLESTEROL: 110 mg/dL (ref 0–200)
HDL: 43.7 mg/dL (ref >39.00)
LDL CALC: 46 mg/dL (ref 0–99)
NonHDL: 65.81
TRIGLYCERIDES: 99 mg/dL (ref 0.0–149.0)
Total CHOL/HDL Ratio: 3
VLDL: 19.8 mg/dL (ref 0.0–40.0)

## 2017-12-17 LAB — HEMOGLOBIN A1C: Hgb A1c MFr Bld: 6.7 % — ABNORMAL HIGH (ref 4.6–6.5)

## 2017-12-17 NOTE — Progress Notes (Signed)
Order placed for labs.

## 2018-01-12 ENCOUNTER — Encounter: Payer: Self-pay | Admitting: Internal Medicine

## 2018-01-12 ENCOUNTER — Ambulatory Visit (INDEPENDENT_AMBULATORY_CARE_PROVIDER_SITE_OTHER): Payer: Medicare Other | Admitting: Internal Medicine

## 2018-01-12 VITALS — BP 128/64 | HR 50 | Temp 97.6°F | Resp 18 | Wt 140.8 lb

## 2018-01-12 DIAGNOSIS — E7800 Pure hypercholesterolemia, unspecified: Secondary | ICD-10-CM

## 2018-01-12 DIAGNOSIS — Z79899 Other long term (current) drug therapy: Secondary | ICD-10-CM | POA: Diagnosis not present

## 2018-01-12 DIAGNOSIS — E559 Vitamin D deficiency, unspecified: Secondary | ICD-10-CM

## 2018-01-12 DIAGNOSIS — I10 Essential (primary) hypertension: Secondary | ICD-10-CM

## 2018-01-12 DIAGNOSIS — R251 Tremor, unspecified: Secondary | ICD-10-CM

## 2018-01-12 DIAGNOSIS — D72829 Elevated white blood cell count, unspecified: Secondary | ICD-10-CM

## 2018-01-12 DIAGNOSIS — R739 Hyperglycemia, unspecified: Secondary | ICD-10-CM

## 2018-01-12 LAB — CBC WITH DIFFERENTIAL/PLATELET
Basophils Absolute: 0.1 10*3/uL (ref 0.0–0.1)
Basophils Relative: 1.3 % (ref 0.0–3.0)
Eosinophils Absolute: 0.6 10*3/uL (ref 0.0–0.7)
Eosinophils Relative: 7.4 % — ABNORMAL HIGH (ref 0.0–5.0)
HCT: 39.8 % (ref 36.0–46.0)
HEMOGLOBIN: 13.6 g/dL (ref 12.0–15.0)
LYMPHS PCT: 40.1 % (ref 12.0–46.0)
Lymphs Abs: 3.2 10*3/uL (ref 0.7–4.0)
MCHC: 34.3 g/dL (ref 30.0–36.0)
MCV: 92.2 fl (ref 78.0–100.0)
MONOS PCT: 10.1 % (ref 3.0–12.0)
Monocytes Absolute: 0.8 10*3/uL (ref 0.1–1.0)
NEUTROS ABS: 3.3 10*3/uL (ref 1.4–7.7)
NEUTROS PCT: 41.1 % — AB (ref 43.0–77.0)
Platelets: 270 10*3/uL (ref 150.0–400.0)
RBC: 4.31 Mil/uL (ref 3.87–5.11)
RDW: 12.4 % (ref 11.5–15.5)
WBC: 7.9 10*3/uL (ref 4.0–10.5)

## 2018-01-12 LAB — HM DIABETES FOOT EXAM

## 2018-01-12 NOTE — Progress Notes (Signed)
Patient ID: Kara Dixon, female   DOB: 1936/04/28, 82 y.o.   MRN: [id number]   Subjective:    Patient ID: Kara Dixon, female    DOB: 1936-06-10, 82 y.o.   MRN: [id number]  HPI  Patient here for a scheduled follow up.  She reports she is doing relatively well.  Handling stress.  Tries to stay active.  No chest pain.  No sob.  No acid reflux.  No abdominal pian.  Bowels moving.  Still noticing some tremor.  Notices more with writing.  Not worsening.  Desires no further intervention.  Taking estrogen.  Again discussed desire to get her off the medication.  She tried to come off previously and did not feel well.  She is gradually decreasing the dose.  Discussed lab results.  Discussed shingrix.     Past Medical History:  Diagnosis Date  . Cancer ([place])    skin  . Hypercholesterolemia   . Hypertension   . Nephrolithiasis   . Osteopenia   . Vitamin D deficiency    Past Surgical History:  Procedure Laterality Date  . cyst removed under tongue  age 49  . EYE SURGERY  02/2017   EYE LID; care everywhere   Family History  Problem Relation Age of Onset  . Stroke Mother   . Hypertension Mother   . Fibromyalgia Sister   . Breast cancer Neg Hx   . Colon cancer Neg Hx    Social History   Socioeconomic History  . Marital status: Married    Spouse name: Not on file  . Number of children: 1  . Years of education: Not on file  . Highest education level: Not on file  Occupational History  . Not on file  Social Needs  . Financial resource strain: Not on file  . Food insecurity:    Worry: Never true    Inability: Never true  . Transportation needs:    Medical: No    Non-medical: No  Tobacco Use  . Smoking status: Never Smoker  . Smokeless tobacco: Never Used  Substance and Sexual Activity  . Alcohol use: No    Alcohol/week: 0.0 oz  . Drug use: No  . Sexual activity: Not Currently  Lifestyle  . Physical activity:    Days per week: 0 days    Minutes per session: Not on file  .  Stress: Not at all  Relationships  . Social connections:    Talks on phone: Patient refused    Gets together: Patient refused    Attends religious service: Patient refused    Active member of club or organization: Patient refused    Attends meetings of clubs or organizations: Patient refused    Relationship status: Patient refused  Other Topics Concern  . Not on file  Social History Narrative  . Not on file    Outpatient Encounter Medications as of 01/12/2018  Medication Sig  . amLODipine (NORVASC) 5 MG tablet Take 1 tablet (5 mg total) by mouth 2 (two) times daily.  Marland Kitchen aspirin 81 MG tablet Take 81 mg by mouth daily.  Marland Kitchen atenolol (TENORMIN) 100 MG tablet TAKE 1 TABLET (100 MG TOTAL) BY MOUTH DAILY.  Marland Kitchen atenolol (TENORMIN) 50 MG tablet TAKE 2 TABLETS BY MOUTH EVERY DAY  . estrogens, conjugated, (PREMARIN) 0.3 MG tablet Take 1 tablet (0.3 mg total) by mouth daily.  . fexofenadine (ALLEGRA) 180 MG tablet Take 180 mg by mouth as needed.   . lactase (LACTAID) 3000 UNITS tablet Take  1 tablet by mouth as needed.  Marland Kitchen LORazepam (ATIVAN) 0.5 MG tablet Take 0.5 tablets (0.25 mg total) by mouth at bedtime as needed.  . progesterone (PROMETRIUM) 100 MG capsule TAKE 1 CAPSULE (100 MG TOTAL) BY MOUTH DAILY.  . rosuvastatin (CRESTOR) 10 MG tablet Take 1 tablet (10 mg total) by mouth daily.   No facility-administered encounter medications on file as of 01/12/2018.     Review of Systems  Constitutional: Negative for appetite change and unexpected weight change.  HENT: Negative for congestion and sinus pressure.   Respiratory: Negative for cough, chest tightness and shortness of breath.   Cardiovascular: Negative for chest pain, palpitations and leg swelling.  Gastrointestinal: Negative for abdominal pain, diarrhea, nausea and vomiting.  Genitourinary: Negative for difficulty urinating, dysuria and vaginal discharge.  Musculoskeletal: Negative for joint swelling and myalgias.  Skin: Negative for color  change and rash.  Neurological: Negative for dizziness, light-headedness and headaches.  Psychiatric/Behavioral: Negative for agitation and dysphoric mood.       Objective:     Blood pressure rechecked by me:  128/64  Physical Exam  Constitutional: She appears well-developed and well-nourished. No distress.  HENT:  Nose: Nose normal.  Mouth/Throat: Oropharynx is clear and moist.  Neck: Neck supple. No thyromegaly present.  Cardiovascular: Normal rate and regular rhythm.  Pulmonary/Chest: Breath sounds normal. No respiratory distress. She has no wheezes.  Abdominal: Soft. Bowel sounds are normal. There is no tenderness.  Musculoskeletal: She exhibits no edema or tenderness.  Feet:  No lesions.  DP pulses palpable and equal bilaterally.  Sensation intact to pin prick and light touch.    Lymphadenopathy:    She has no cervical adenopathy.  Neurological:  Finger to nose - intact.  No significant tremor noted on exam.   Skin: No rash noted. No erythema.  Psychiatric: She has a normal mood and affect. Her behavior is normal.    BP 128/64   Pulse (!) 50   Temp 97.6 F (36.4 C) (Oral)   Resp 18   Wt 140 lb 12.8 oz (63.9 kg)   SpO2 98%   BMI 27.28 kg/m  Wt Readings from Last 3 Encounters:  01/12/18 140 lb 12.8 oz (63.9 kg)  09/23/17 141 lb (64 kg)  09/10/17 140 lb 8 oz (63.7 kg)     Lab Results  Component Value Date   WBC 7.9 01/12/2018   HGB 13.6 01/12/2018   HCT 39.8 01/12/2018   PLT 270.0 01/12/2018   GLUCOSE 137 (H) 12/16/2017   CHOL 110 12/17/2017   TRIG 99.0 12/17/2017   HDL 43.70 12/17/2017   LDLDIRECT 139.4 05/12/2013   LDLCALC 46 12/17/2017   ALT 14 12/16/2017   AST 16 12/16/2017   NA 139 12/16/2017   K 4.4 12/16/2017   CL 106 12/16/2017   CREATININE 1.05 12/16/2017   BUN 12 12/16/2017   CO2 27 12/16/2017   TSH 2.86 09/10/2017   HGBA1C 6.7 (H) 12/17/2017   MICROALBUR 4.8 (H) 05/14/2014    Mm Digital Screening Bilateral  Result Date:  11/16/2017 CLINICAL DATA:  Screening. EXAM: DIGITAL SCREENING BILATERAL MAMMOGRAM WITH CAD COMPARISON:  Previous exam(s). ACR Breast Density Category b: There are scattered areas of fibroglandular density. FINDINGS: There are no findings suspicious for malignancy. Images were processed with CAD. IMPRESSION: No mammographic evidence of malignancy. A result letter of this screening mammogram will be mailed directly to the patient. RECOMMENDATION: Screening mammogram in one year. (Code:SM-B-01Y) BI-RADS CATEGORY  1: Negative. Electronically Signed  By: Franki Cabot M.D.   On: 11/16/2017 11:05       Assessment & Plan:   Problem List Items Addressed This Visit    Current use of estrogen therapy    Discussed with her today.  Discussed risk and side effects of long term estrogen therapy.  She has tried to come off previously.  Now slowly tapering.  Follow.        Hypercholesterolemia    On crestor.  Low cholesterol diet and exercise.  Follow lipid panel and liver function tests.   Lab Results  Component Value Date   CHOL 110 12/17/2017   HDL 43.70 12/17/2017   LDLCALC 46 12/17/2017   LDLDIRECT 139.4 05/12/2013   TRIG 99.0 12/17/2017   CHOLHDL 3 12/17/2017        Relevant Orders   Hepatic function panel   Lipid panel   Hyperglycemia    Low carb diet and exercise.  Follow met b and a1c.        Relevant Orders   Hemoglobin A1c   Hypertension    Blood pressure as outlined.  Continue same medication regimen.  Follow pressures.  Follow metabolic panel.       Relevant Orders   Basic metabolic panel   Tremor    No significant tremor noted on exam today.  She notices more when writing.  Not affecting her eating.  Discussed further w/up and evaluation.  She declines.  Follow.        Vitamin D deficiency    Follow vitamin D level.         Other Visit Diagnoses    Leukocytosis, unspecified type    -  Primary   Relevant Orders   CBC with Differential/Platelet (Completed)        Einar Pheasant, MD

## 2018-01-15 ENCOUNTER — Encounter: Payer: Self-pay | Admitting: Internal Medicine

## 2018-01-15 NOTE — Assessment & Plan Note (Signed)
Blood pressure as outlined.  Continue same medication regimen.  Follow pressures.  Follow metabolic panel.  

## 2018-01-15 NOTE — Assessment & Plan Note (Signed)
Low carb diet and exercise.  Follow met b and a1c.   

## 2018-01-15 NOTE — Assessment & Plan Note (Signed)
Discussed with her today.  Discussed risk and side effects of long term estrogen therapy.  She has tried to come off previously.  Now slowly tapering.  Follow.

## 2018-01-15 NOTE — Assessment & Plan Note (Signed)
Follow vitamin D level.  

## 2018-01-15 NOTE — Assessment & Plan Note (Signed)
No significant tremor noted on exam today.  She notices more when writing.  Not affecting her eating.  Discussed further w/up and evaluation.  She declines.  Follow.

## 2018-01-15 NOTE — Assessment & Plan Note (Signed)
On crestor.  Low cholesterol diet and exercise.  Follow lipid panel and liver function tests.   Lab Results  Component Value Date   CHOL 110 12/17/2017   HDL 43.70 12/17/2017   LDLCALC 46 12/17/2017   LDLDIRECT 139.4 05/12/2013   TRIG 99.0 12/17/2017   CHOLHDL 3 12/17/2017

## 2018-01-27 ENCOUNTER — Other Ambulatory Visit: Payer: Self-pay | Admitting: Internal Medicine

## 2018-01-27 DIAGNOSIS — E785 Hyperlipidemia, unspecified: Secondary | ICD-10-CM

## 2018-02-08 ENCOUNTER — Encounter: Payer: Self-pay | Admitting: *Deleted

## 2018-02-27 ENCOUNTER — Other Ambulatory Visit: Payer: Self-pay | Admitting: Internal Medicine

## 2018-03-06 ENCOUNTER — Other Ambulatory Visit: Payer: Self-pay | Admitting: Internal Medicine

## 2018-04-23 ENCOUNTER — Other Ambulatory Visit: Payer: Self-pay | Admitting: Internal Medicine

## 2018-05-16 ENCOUNTER — Other Ambulatory Visit (INDEPENDENT_AMBULATORY_CARE_PROVIDER_SITE_OTHER): Payer: Medicare Other

## 2018-05-16 DIAGNOSIS — E7800 Pure hypercholesterolemia, unspecified: Secondary | ICD-10-CM | POA: Diagnosis not present

## 2018-05-16 DIAGNOSIS — I10 Essential (primary) hypertension: Secondary | ICD-10-CM

## 2018-05-16 DIAGNOSIS — R739 Hyperglycemia, unspecified: Secondary | ICD-10-CM | POA: Diagnosis not present

## 2018-05-16 LAB — BASIC METABOLIC PANEL
BUN: 15 mg/dL (ref 6–23)
CHLORIDE: 107 meq/L (ref 96–112)
CO2: 27 meq/L (ref 19–32)
CREATININE: 1.12 mg/dL (ref 0.40–1.20)
Calcium: 9.3 mg/dL (ref 8.4–10.5)
GFR: 49.74 mL/min — ABNORMAL LOW (ref >60.00)
Glucose, Bld: 138 mg/dL — ABNORMAL HIGH (ref 70–99)
POTASSIUM: 4.3 meq/L (ref 3.5–5.1)
Sodium: 140 mEq/L (ref 135–145)

## 2018-05-16 LAB — HEPATIC FUNCTION PANEL
ALT: 11 U/L (ref 0–35)
AST: 15 U/L (ref 0–37)
Albumin: 4 g/dL (ref 3.5–5.2)
Alkaline Phosphatase: 52 U/L (ref 39–117)
BILIRUBIN DIRECT: 0 mg/dL (ref 0.0–0.3)
BILIRUBIN TOTAL: 0.5 mg/dL (ref 0.2–1.2)
TOTAL PROTEIN: 7.3 g/dL (ref 6.0–8.3)

## 2018-05-16 LAB — HEMOGLOBIN A1C: HEMOGLOBIN A1C: 6.7 % — AB (ref 4.6–6.5)

## 2018-05-16 LAB — LIPID PANEL
CHOL/HDL RATIO: 3
CHOLESTEROL: 108 mg/dL (ref 0–200)
HDL: 40.4 mg/dL (ref >39.00)
LDL CALC: 46 mg/dL (ref 0–99)
NonHDL: 67.56
Triglycerides: 108 mg/dL (ref 0.0–149.0)
VLDL: 21.6 mg/dL (ref 0.0–40.0)

## 2018-05-18 ENCOUNTER — Encounter: Payer: Self-pay | Admitting: Internal Medicine

## 2018-05-18 ENCOUNTER — Ambulatory Visit (INDEPENDENT_AMBULATORY_CARE_PROVIDER_SITE_OTHER): Payer: Medicare Other | Admitting: Internal Medicine

## 2018-05-18 VITALS — BP 128/78 | HR 52 | Temp 97.6°F | Resp 18 | Wt 143.2 lb

## 2018-05-18 DIAGNOSIS — E559 Vitamin D deficiency, unspecified: Secondary | ICD-10-CM | POA: Diagnosis not present

## 2018-05-18 DIAGNOSIS — Z23 Encounter for immunization: Secondary | ICD-10-CM

## 2018-05-18 DIAGNOSIS — E7800 Pure hypercholesterolemia, unspecified: Secondary | ICD-10-CM

## 2018-05-18 DIAGNOSIS — E119 Type 2 diabetes mellitus without complications: Secondary | ICD-10-CM

## 2018-05-18 DIAGNOSIS — I10 Essential (primary) hypertension: Secondary | ICD-10-CM

## 2018-05-18 DIAGNOSIS — Z79899 Other long term (current) drug therapy: Secondary | ICD-10-CM

## 2018-05-18 DIAGNOSIS — H938X9 Other specified disorders of ear, unspecified ear: Secondary | ICD-10-CM | POA: Diagnosis not present

## 2018-05-18 DIAGNOSIS — Z79818 Long term (current) use of other agents affecting estrogen receptors and estrogen levels: Secondary | ICD-10-CM

## 2018-05-18 MED ORDER — LORAZEPAM 0.5 MG PO TABS: 0.2500 mg | ORAL_TABLET | Freq: Every evening | ORAL | 0 refills | Status: DC | PRN

## 2018-05-18 NOTE — Patient Instructions (Signed)
Saline nasal spray - flush nose at least 2-3x/day  nasacort nasal spray - 2 sprays each nostril one time per day.  Do this in the evening.  

## 2018-05-18 NOTE — Progress Notes (Signed)
Patient ID: Kara Dixon, female   DOB: October 14, 1935, 82 y.o.   MRN: [id number]   Subjective:    Patient ID: Kara Dixon, female    DOB: [birth year], 82 y.o.   MRN: [id number]  HPI  Patient here for a scheduled follow up.  She reports she is doing well.  Occasionally will feel like she is talking in a barrel.  Left ear will feel full.  No pain.  No headache.  No light headedness.  Not constant.  No sore throat.  No chest congestion.  No sob.  No acid reflux.  No abdominal pain.  Bowels moving.  Discussed labs.  Discussed estrogen usage.  Discussed desire to get her off the medication.  She is attempting to gradually decrease the dose.     Past Medical History:  Diagnosis Date  . Cancer ([place])    skin  . Hypercholesterolemia   . Hypertension   . Nephrolithiasis   . Osteopenia   . Vitamin D deficiency    Past Surgical History:  Procedure Laterality Date  . cyst removed under tongue  age 77  . EYE SURGERY  02/2017   EYE LID; care everywhere   Family History  Problem Relation Age of Onset  . Stroke Mother   . Hypertension Mother   . Fibromyalgia Sister   . Breast cancer Neg Hx   . Colon cancer Neg Hx    Social History   Socioeconomic History  . Marital status: Married    Spouse name: Not on file  . Number of children: 1  . Years of education: Not on file  . Highest education level: Not on file  Occupational History  . Not on file  Social Needs  . Financial resource strain: Not on file  . Food insecurity:    Worry: Never true    Inability: Never true  . Transportation needs:    Medical: No    Non-medical: No  Tobacco Use  . Smoking status: Never Smoker  . Smokeless tobacco: Never Used  Substance and Sexual Activity  . Alcohol use: No    Alcohol/week: 0.0 standard drinks  . Drug use: No  . Sexual activity: Not Currently  Lifestyle  . Physical activity:    Days per week: 0 days    Minutes per session: Not on file  . Stress: Not at all  Relationships  . Social  connections:    Talks on phone: Patient refused    Gets together: Patient refused    Attends religious service: Patient refused    Active member of club or organization: Patient refused    Attends meetings of clubs or organizations: Patient refused    Relationship status: Patient refused  Other Topics Concern  . Not on file  Social History Narrative  . Not on file    Outpatient Encounter Medications as of 05/18/2018  Medication Sig  . amLODipine (NORVASC) 5 MG tablet TAKE 1 TABLET BY MOUTH TWICE A DAY  . aspirin 81 MG tablet Take 81 mg by mouth daily.  Marland Kitchen atenolol (TENORMIN) 50 MG tablet TAKE 2 TABLETS BY MOUTH EVERY DAY  . fexofenadine (ALLEGRA) 180 MG tablet Take 180 mg by mouth as needed.   . lactase (LACTAID) 3000 UNITS tablet Take 1 tablet by mouth as needed.  Marland Kitchen LORazepam (ATIVAN) 0.5 MG tablet Take 0.5 tablets (0.25 mg total) by mouth at bedtime as needed.  Marland Kitchen PREMARIN 0.3 MG tablet TAKE 1 TABLET (0.3 MG TOTAL) BY MOUTH DAILY.  . progesterone (  PROMETRIUM) 100 MG capsule TAKE 1 CAPSULE (100 MG TOTAL) BY MOUTH DAILY.  . rosuvastatin (CRESTOR) 10 MG tablet TAKE 1 TABLET BY MOUTH EVERY DAY  . [DISCONTINUED] atenolol (TENORMIN) 100 MG tablet TAKE 1 TABLET (100 MG TOTAL) BY MOUTH DAILY.  . [DISCONTINUED] LORazepam (ATIVAN) 0.5 MG tablet Take 0.5 tablets (0.25 mg total) by mouth at bedtime as needed.   No facility-administered encounter medications on file as of 05/18/2018.     Review of Systems  Constitutional: Negative for appetite change and unexpected weight change.  HENT: Negative for congestion and sinus pressure.        Intermittent ear fullness as outlined.    Respiratory: Negative for cough, chest tightness and shortness of breath.   Cardiovascular: Negative for chest pain, palpitations and leg swelling.  Gastrointestinal: Negative for abdominal pain, diarrhea, nausea and vomiting.  Genitourinary: Negative for difficulty urinating and dysuria.  Musculoskeletal: Negative for  joint swelling and myalgias.  Skin: Negative for color change and rash.  Neurological: Negative for dizziness, light-headedness and headaches.  Psychiatric/Behavioral: Negative for agitation and dysphoric mood.       Objective:    Physical Exam  Constitutional: She appears well-developed and well-nourished. No distress.  HENT:  Nose: Nose normal.  Mouth/Throat: Oropharynx is clear and moist.  TMs without erythema.    Neck: Neck supple. No thyromegaly present.  Cardiovascular: Normal rate and regular rhythm.  Pulmonary/Chest: Breath sounds normal. No respiratory distress. She has no wheezes.  Abdominal: Soft. Bowel sounds are normal. There is no tenderness.  Musculoskeletal: She exhibits no edema or tenderness.  Feet:  Some decrease sensation toes which improves when go more proximal.  No lesions. DP pulses palpable and equal bilaterally.    Lymphadenopathy:    She has no cervical adenopathy.  Skin: No rash noted. No erythema.  Psychiatric: She has a normal mood and affect. Her behavior is normal.    BP 128/78 (BP Location: Left Arm, Patient Position: Sitting, Cuff Size: Normal)   Pulse (!) 52   Temp 97.6 F (36.4 C) (Oral)   Resp 18   Wt 143 lb 3.2 oz (65 kg)   SpO2 98%   BMI 27.74 kg/m  Wt Readings from Last 3 Encounters:  05/18/18 143 lb 3.2 oz (65 kg)  01/12/18 140 lb 12.8 oz (63.9 kg)  09/23/17 141 lb (64 kg)     Lab Results  Component Value Date   WBC 7.9 01/12/2018   HGB 13.6 01/12/2018   HCT 39.8 01/12/2018   PLT 270.0 01/12/2018   GLUCOSE 138 (H) 05/16/2018   CHOL 108 05/16/2018   TRIG 108.0 05/16/2018   HDL 40.40 05/16/2018   LDLDIRECT 139.4 05/12/2013   LDLCALC 46 05/16/2018   ALT 11 05/16/2018   AST 15 05/16/2018   NA 140 05/16/2018   K 4.3 05/16/2018   CL 107 05/16/2018   CREATININE 1.12 05/16/2018   BUN 15 05/16/2018   CO2 27 05/16/2018   TSH 2.86 09/10/2017   HGBA1C 6.7 (H) 05/16/2018   MICROALBUR 4.8 (H) 05/14/2014    Mm Digital  Screening Bilateral  Result Date: 11/16/2017 CLINICAL DATA:  Screening. EXAM: DIGITAL SCREENING BILATERAL MAMMOGRAM WITH CAD COMPARISON:  Previous exam(s). ACR Breast Density Category b: There are scattered areas of fibroglandular density. FINDINGS: There are no findings suspicious for malignancy. Images were processed with CAD. IMPRESSION: No mammographic evidence of malignancy. A result letter of this screening mammogram will be mailed directly to the patient. RECOMMENDATION: Screening mammogram in one  year. (Code:SM-B-01Y) BI-RADS CATEGORY  1: Negative. Electronically Signed   By: Franki Cabot M.D.   On: 11/16/2017 11:05       Assessment & Plan:   Problem List Items Addressed This Visit    Current use of estrogen therapy    Discussed trying to get her off estrogen.  She has decreased dose.  Hopefully can continue to decrease.  Have tried stopping previously and she did not do well.  Follow.        Diabetes mellitus without complication (HCC)    Low carb diet and exercise.  Follow met b and a1c.  Discussed diet and exercise.  Discussed diagnosis of diabetes.        Relevant Orders   Hemoglobin S1J   Basic metabolic panel   Hypercholesterolemia    On crestor.  Low cholesterol diet and exercise.  Follow lipid panel and liver function tests.        Relevant Orders   Lipid panel   Hepatic function panel   Hypertension - Primary    Blood pressure under good control.  Continue same medication regimen.  Follow pressures.  Follow metabolic panel.        Relevant Orders   TSH   Vitamin D deficiency    Instructed to take vitamin D3 1000 units per day.  Follow vitamin D level.         Other Visit Diagnoses    Need for 23-polyvalent pneumococcal polysaccharide vaccine       Relevant Orders   Pneumococcal polysaccharide vaccine 23-valent greater than or equal to 2yo subcutaneous/IM (Completed)   Sensation of fullness in ear, unspecified laterality       No fullness currently.  Occurs  intermittently.  Discussed saline nasal spray and nasacort.  Follow.  Notify me if persistent.         Einar Pheasant, MD

## 2018-05-21 ENCOUNTER — Encounter: Payer: Self-pay | Admitting: Internal Medicine

## 2018-05-21 NOTE — Assessment & Plan Note (Signed)
On crestor.  Low cholesterol diet and exercise.  Follow lipid panel and liver function tests.   

## 2018-05-21 NOTE — Assessment & Plan Note (Signed)
Discussed trying to get her off estrogen.  She has decreased dose.  Hopefully can continue to decrease.  Have tried stopping previously and she did not do well.  Follow.

## 2018-05-21 NOTE — Assessment & Plan Note (Signed)
Instructed to take vitamin D3 1000 units per day.  Follow vitamin D level.

## 2018-05-21 NOTE — Assessment & Plan Note (Signed)
Blood pressure under good control.  Continue same medication regimen.  Follow pressures.  Follow metabolic panel.   

## 2018-05-21 NOTE — Assessment & Plan Note (Signed)
Low carb diet and exercise.  Follow met b and a1c.  Discussed diet and exercise.  Discussed diagnosis of diabetes.

## 2018-06-02 ENCOUNTER — Other Ambulatory Visit: Payer: Self-pay | Admitting: Internal Medicine

## 2018-06-02 DIAGNOSIS — E785 Hyperlipidemia, unspecified: Secondary | ICD-10-CM

## 2018-06-12 ENCOUNTER — Other Ambulatory Visit: Payer: Self-pay | Admitting: Internal Medicine

## 2018-07-10 ENCOUNTER — Other Ambulatory Visit: Payer: Self-pay | Admitting: Internal Medicine

## 2018-08-11 ENCOUNTER — Other Ambulatory Visit: Payer: Self-pay | Admitting: Internal Medicine

## 2018-08-23 ENCOUNTER — Other Ambulatory Visit: Payer: Self-pay | Admitting: Internal Medicine

## 2018-08-26 ENCOUNTER — Other Ambulatory Visit: Payer: Self-pay | Admitting: Internal Medicine

## 2018-09-06 ENCOUNTER — Ambulatory Visit (INDEPENDENT_AMBULATORY_CARE_PROVIDER_SITE_OTHER): Payer: Medicare Other

## 2018-09-06 DIAGNOSIS — Z23 Encounter for immunization: Secondary | ICD-10-CM

## 2018-09-06 NOTE — Progress Notes (Addendum)
Pt was seen to for a NV for her high dose flu shot given in IM in the LD. Pt tolerated well.   Reviewed.  Dr Nicki Reaper

## 2018-09-14 ENCOUNTER — Other Ambulatory Visit: Payer: Self-pay | Admitting: Internal Medicine

## 2018-09-21 ENCOUNTER — Other Ambulatory Visit (INDEPENDENT_AMBULATORY_CARE_PROVIDER_SITE_OTHER): Payer: Medicare Other

## 2018-09-21 DIAGNOSIS — I10 Essential (primary) hypertension: Secondary | ICD-10-CM

## 2018-09-21 DIAGNOSIS — E119 Type 2 diabetes mellitus without complications: Secondary | ICD-10-CM

## 2018-09-21 DIAGNOSIS — E7800 Pure hypercholesterolemia, unspecified: Secondary | ICD-10-CM

## 2018-09-21 LAB — BASIC METABOLIC PANEL
BUN: 10 mg/dL (ref 6–23)
CO2: 26 mEq/L (ref 19–32)
Calcium: 9 mg/dL (ref 8.4–10.5)
Chloride: 108 mEq/L (ref 96–112)
Creatinine, Ser: 0.98 mg/dL (ref 0.40–1.20)
GFR: 57.98 mL/min — AB (ref >60.00)
Glucose, Bld: 132 mg/dL — ABNORMAL HIGH (ref 70–99)
POTASSIUM: 4 meq/L (ref 3.5–5.1)
SODIUM: 141 meq/L (ref 135–145)

## 2018-09-21 LAB — HEMOGLOBIN A1C: HEMOGLOBIN A1C: 6.5 % (ref 4.6–6.5)

## 2018-09-21 LAB — LIPID PANEL
Cholesterol: 99 mg/dL (ref 0–200)
HDL: 38.2 mg/dL — ABNORMAL LOW (ref >39.00)
LDL Cholesterol: 36 mg/dL (ref 0–99)
NonHDL: 60.52
TRIGLYCERIDES: 121 mg/dL (ref 0.0–149.0)
Total CHOL/HDL Ratio: 3
VLDL: 24.2 mg/dL (ref 0.0–40.0)

## 2018-09-21 LAB — HEPATIC FUNCTION PANEL
ALK PHOS: 53 U/L (ref 39–117)
ALT: 10 U/L (ref 0–35)
AST: 13 U/L (ref 0–37)
Albumin: 3.9 g/dL (ref 3.5–5.2)
BILIRUBIN TOTAL: 0.5 mg/dL (ref 0.2–1.2)
Bilirubin, Direct: 0.1 mg/dL (ref 0.0–0.3)
Total Protein: 7.4 g/dL (ref 6.0–8.3)

## 2018-09-21 LAB — TSH: TSH: 3.96 u[IU]/mL (ref 0.35–4.50)

## 2018-09-23 ENCOUNTER — Encounter: Payer: Self-pay | Admitting: Internal Medicine

## 2018-09-23 ENCOUNTER — Ambulatory Visit (INDEPENDENT_AMBULATORY_CARE_PROVIDER_SITE_OTHER): Payer: Medicare Other | Admitting: Internal Medicine

## 2018-09-23 ENCOUNTER — Ambulatory Visit (INDEPENDENT_AMBULATORY_CARE_PROVIDER_SITE_OTHER): Payer: Medicare Other

## 2018-09-23 VITALS — BP 118/72 | HR 60 | Temp 98.0°F | Resp 15 | Ht 60.25 in | Wt 137.8 lb

## 2018-09-23 VITALS — BP 118/72 | HR 60 | Temp 98.0°F | Resp 18 | Ht 60.0 in | Wt 137.0 lb

## 2018-09-23 DIAGNOSIS — E559 Vitamin D deficiency, unspecified: Secondary | ICD-10-CM | POA: Diagnosis not present

## 2018-09-23 DIAGNOSIS — I10 Essential (primary) hypertension: Secondary | ICD-10-CM | POA: Diagnosis not present

## 2018-09-23 DIAGNOSIS — E119 Type 2 diabetes mellitus without complications: Secondary | ICD-10-CM

## 2018-09-23 DIAGNOSIS — Z1239 Encounter for other screening for malignant neoplasm of breast: Secondary | ICD-10-CM

## 2018-09-23 DIAGNOSIS — Z0000 Encounter for general adult medical examination without abnormal findings: Secondary | ICD-10-CM

## 2018-09-23 DIAGNOSIS — E785 Hyperlipidemia, unspecified: Secondary | ICD-10-CM | POA: Diagnosis not present

## 2018-09-23 DIAGNOSIS — Z79899 Other long term (current) drug therapy: Secondary | ICD-10-CM

## 2018-09-23 DIAGNOSIS — E7800 Pure hypercholesterolemia, unspecified: Secondary | ICD-10-CM

## 2018-09-23 DIAGNOSIS — Z1231 Encounter for screening mammogram for malignant neoplasm of breast: Secondary | ICD-10-CM

## 2018-09-23 MED ORDER — ROSUVASTATIN CALCIUM 10 MG PO TABS: 10.0000 mg | ORAL_TABLET | Freq: Every day | ORAL | 1 refills | Status: DC

## 2018-09-23 NOTE — Progress Notes (Signed)
Patient ID: Kara Dixon, female   DOB: January 04, 1936, 82 y.o.   MRN: [id number]   Subjective:    Patient ID: Kara Dixon, female    DOB: 1936/07/04, 82 y.o.   MRN: [id number]  HPI  Patient with past history of hypercholesterolemia and hypertension.  She comes in today to follow up on these issues as well as for a complete physical exam.  She reports she is doing relatively well.  Did have episode of vomiting and diarrhea at last week.  Better now.  Eating.  No chest pain.  No sob.  No acid reflux.  No abdominal pain.     Past Medical History:  Diagnosis Date  . Cancer ([place])    skin  . Hypercholesterolemia   . Hypertension   . Nephrolithiasis   . Osteopenia   . Vitamin D deficiency    Past Surgical History:  Procedure Laterality Date  . cyst removed under tongue  age 52  . EYE SURGERY  02/2017   EYE LID; care everywhere   Family History  Problem Relation Age of Onset  . Stroke Mother   . Hypertension Mother   . Fibromyalgia Sister   . Breast cancer Neg Hx   . Colon cancer Neg Hx    Social History   Socioeconomic History  . Marital status: Married    Spouse name: Not on file  . Number of children: 1  . Years of education: Not on file  . Highest education level: Not on file  Occupational History  . Not on file  Social Needs  . Financial resource strain: Not on file  . Food insecurity:    Worry: Never true    Inability: Never true  . Transportation needs:    Medical: No    Non-medical: No  Tobacco Use  . Smoking status: Never Smoker  . Smokeless tobacco: Never Used  Substance and Sexual Activity  . Alcohol use: No    Alcohol/week: 0.0 standard drinks  . Drug use: No  . Sexual activity: Not Currently  Lifestyle  . Physical activity:    Days per week: 0 days    Minutes per session: Not on file  . Stress: Not at all  Relationships  . Social connections:    Talks on phone: Patient refused    Gets together: Patient refused    Attends religious service:  Patient refused    Active member of club or organization: Patient refused    Attends meetings of clubs or organizations: Patient refused    Relationship status: Patient refused  Other Topics Concern  . Not on file  Social History Narrative  . Not on file    Outpatient Encounter Medications as of 09/23/2018  Medication Sig  . amLODipine (NORVASC) 5 MG tablet TAKE 1 TABLET BY MOUTH TWICE A DAY  . aspirin 81 MG tablet Take 81 mg by mouth daily.  Marland Kitchen atenolol (TENORMIN) 50 MG tablet TAKE 2 TABLETS BY MOUTH EVERY DAY  . fexofenadine (ALLEGRA) 180 MG tablet Take 180 mg by mouth as needed.   . lactase (LACTAID) 3000 UNITS tablet Take 1 tablet by mouth as needed.  Marland Kitchen LORazepam (ATIVAN) 0.5 MG tablet Take 0.5 tablets (0.25 mg total) by mouth at bedtime as needed.  Marland Kitchen PREMARIN 0.3 MG tablet TAKE 1 TABLET (0.3 MG TOTAL) BY MOUTH DAILY.  . progesterone (PROMETRIUM) 100 MG capsule TAKE 1 CAPSULE BY MOUTH EVERY DAY  . rosuvastatin (CRESTOR) 10 MG tablet Take 1 tablet (10 mg total) by  mouth daily.  . [DISCONTINUED] rosuvastatin (CRESTOR) 10 MG tablet TAKE 1 TABLET BY MOUTH EVERY DAY   No facility-administered encounter medications on file as of 09/23/2018.     Review of Systems  Constitutional: Negative for appetite change and unexpected weight change.  HENT: Negative for congestion and sinus pressure.   Eyes: Negative for pain and visual disturbance.  Respiratory: Negative for cough, chest tightness and shortness of breath.   Cardiovascular: Negative for chest pain, palpitations and leg swelling.  Gastrointestinal: Negative for abdominal pain, diarrhea, nausea and vomiting.  Genitourinary: Negative for difficulty urinating and dysuria.  Musculoskeletal: Negative for joint swelling and myalgias.  Skin: Negative for color change and rash.  Neurological: Negative for dizziness, light-headedness and headaches.  Hematological: Negative for adenopathy. Does not bruise/bleed easily.    Psychiatric/Behavioral: Negative for agitation and dysphoric mood.       Objective:    Physical Exam Constitutional:      General: She is not in acute distress.    Appearance: Normal appearance. She is well-developed.  HENT:     Nose: Nose normal. No congestion.     Mouth/Throat:     Pharynx: No oropharyngeal exudate or posterior oropharyngeal erythema.  Eyes:     General: No scleral icterus.       Right eye: No discharge.        Left eye: No discharge.  Neck:     Musculoskeletal: Neck supple.     Thyroid: No thyromegaly.  Cardiovascular:     Rate and Rhythm: Normal rate and regular rhythm.  Pulmonary:     Effort: No tachypnea, accessory muscle usage or respiratory distress.     Breath sounds: Normal breath sounds. No decreased breath sounds, wheezing or rhonchi.  Chest:     Breasts:        Right: No inverted nipple, mass, nipple discharge or tenderness (no axillary adenopathy).        Left: No inverted nipple, mass, nipple discharge or tenderness (no axilarry adenopathy).  Abdominal:     General: Bowel sounds are normal.     Palpations: Abdomen is soft.     Tenderness: There is no abdominal tenderness.  Musculoskeletal:        General: No swelling or tenderness.  Lymphadenopathy:     Cervical: No cervical adenopathy.  Skin:    Findings: No erythema or rash.  Neurological:     Mental Status: She is alert and oriented to person, place, and time.  Psychiatric:        Behavior: Behavior normal.     BP 118/72 (BP Location: Left Arm, Patient Position: Sitting, Cuff Size: Normal)   Pulse 60   Temp 98 F (36.7 C) (Oral)   Resp 18   Ht 5' (1.524 m)   Wt 137 lb (62.1 kg)   SpO2 96%   BMI 26.76 kg/m  Wt Readings from Last 3 Encounters:  09/23/18 137 lb (62.1 kg)  09/23/18 137 lb 12.8 oz (62.5 kg)  05/18/18 143 lb 3.2 oz (65 kg)     Lab Results  Component Value Date   WBC 7.9 01/12/2018   HGB 13.6 01/12/2018   HCT 39.8 01/12/2018   PLT 270.0 01/12/2018    GLUCOSE 132 (H) 09/21/2018   CHOL 99 09/21/2018   TRIG 121.0 09/21/2018   HDL 38.20 (L) 09/21/2018   LDLDIRECT 139.4 05/12/2013   LDLCALC 36 09/21/2018   ALT 10 09/21/2018   AST 13 09/21/2018   NA 141 09/21/2018  K 4.0 09/21/2018   CL 108 09/21/2018   CREATININE 0.98 09/21/2018   BUN 10 09/21/2018   CO2 26 09/21/2018   TSH 3.96 09/21/2018   HGBA1C 6.5 09/21/2018   MICROALBUR 4.8 (H) 05/14/2014    Mm Digital Screening Bilateral  Result Date: 11/16/2017 CLINICAL DATA:  Screening. EXAM: DIGITAL SCREENING BILATERAL MAMMOGRAM WITH CAD COMPARISON:  Previous exam(s). ACR Breast Density Category b: There are scattered areas of fibroglandular density. FINDINGS: There are no findings suspicious for malignancy. Images were processed with CAD. IMPRESSION: No mammographic evidence of malignancy. A result letter of this screening mammogram will be mailed directly to the patient. RECOMMENDATION: Screening mammogram in one year. (Code:SM-B-01Y) BI-RADS CATEGORY  1: Negative. Electronically Signed   By: Franki Cabot M.D.   On: 11/16/2017 11:05       Assessment & Plan:   Problem List Items Addressed This Visit    Current use of estrogen therapy    Have discussed trying to get her off estrogen.  She elects to remain on estrogen.  Understands risk and possible side effects of estrogen.        Diabetes mellitus without complication (HCC)    Low carb diet and exercise.  Follow met b and a1c.        Relevant Medications   rosuvastatin (CRESTOR) 10 MG tablet   Other Relevant Orders   Hemoglobin A1c   Health care maintenance    Physical today 09/23/18.  Mammogram 11/16/17 - Birads I.  Colonoscopy 03/02/13.        Hypercholesterolemia    On crestor.  Low cholesterol diet and exercise.  Follow lipid panel and liver function tests.        Relevant Medications   rosuvastatin (CRESTOR) 10 MG tablet   Hypertension    Blood pressure under good control.  Continue same medication regimen.  Follow  pressures.  Follow metabolic panel.        Relevant Medications   rosuvastatin (CRESTOR) 10 MG tablet   Other Relevant Orders   CBC with Differential/Platelet   Basic metabolic panel   Vitamin D deficiency    Follow vitamin D level.         Other Visit Diagnoses    Visit for screening mammogram    -  Primary   Relevant Orders   MM 3D SCREEN BREAST BILATERAL   Breast cancer screening       Hyperlipidemia, unspecified hyperlipidemia type       Relevant Medications   rosuvastatin (CRESTOR) 10 MG tablet   Other Relevant Orders   Hepatic function panel   Lipid panel       Einar Pheasant, MD

## 2018-09-23 NOTE — Assessment & Plan Note (Signed)
Physical today 09/23/18.  Mammogram 11/16/17 - Birads I.  Colonoscopy 03/02/13.

## 2018-09-23 NOTE — Patient Instructions (Addendum)
  Ms. Leifheit , Thank you for taking time to come for your Medicare Wellness Visit. I appreciate your ongoing commitment to your health goals. Please review the following plan we discussed and let me know if I can assist you in the future.   Schedule eye exam  Merry Christmas!  These are the goals we discussed: Goals      Patient Stated   . Increase physical activity (pt-stated)     Start [employer] program with husband        This is a list of the screening recommended for you and due dates:  Health Maintenance  Topic Date Due  . Eye exam for diabetics  04/30/1948  . Tetanus Vaccine  04/30/1957  . Urine Protein Check  05/15/2015  . Mammogram  11/16/2018  . Complete foot exam   01/13/2019  . Hemoglobin A1C  03/23/2019  . Flu Shot  Completed  . DEXA scan (bone density measurement)  Completed  . Pneumonia vaccines  Completed

## 2018-09-23 NOTE — Progress Notes (Signed)
Subjective:   Kara Dixon is a 82 y.o. female who presents for Medicare Annual (Subsequent) preventive examination.  Review of Systems:  No ROS.  Medicare Wellness Visit. Additional risk factors are reflected in the social history. Cardiac Risk Factors include: advanced age (>57men, >42 women);diabetes mellitus;hypertension     Objective:     Vitals: BP 118/72 (BP Location: Left Arm, Patient Position: Sitting, Cuff Size: Normal)   Pulse 60   Temp 98 F (36.7 C) (Oral)   Resp 15   Ht 5' 0.25" (1.53 m)   Wt 137 lb 12.8 oz (62.5 kg)   SpO2 96%   BMI 26.69 kg/m   Body mass index is 26.69 kg/m.  Advanced Directives 09/23/2018 09/23/2017 09/22/2016  Does Patient Have a Medical Advance Directive? No No No  Would patient like information on creating a medical advance directive? No - Patient declined Yes (MAU/Ambulatory/Procedural Areas - Information given) No - Patient declined    Tobacco Social History   Tobacco Use  Smoking Status Never Smoker  Smokeless Tobacco Never Used     Counseling given: Not Answered   Clinical Intake:  Pre-visit preparation completed: Yes  Pain : No/denies pain     Diabetes: Yes  How often do you need to have someone help you when you read instructions, pamphlets, or other written materials from your doctor or pharmacy?: 1 - Never  Interpreter Needed?: No     Past Medical History:  Diagnosis Date  . Cancer ([place])    skin  . Hypercholesterolemia   . Hypertension   . Nephrolithiasis   . Osteopenia   . Vitamin D deficiency    Past Surgical History:  Procedure Laterality Date  . cyst removed under tongue  age 70  . EYE SURGERY  02/2017   EYE LID; care everywhere   Family History  Problem Relation Age of Onset  . Stroke Mother   . Hypertension Mother   . Fibromyalgia Sister   . Breast cancer Neg Hx   . Colon cancer Neg Hx    Social History   Socioeconomic History  . Marital status: Married    Spouse name: Not on file   . Number of children: 1  . Years of education: Not on file  . Highest education level: Not on file  Occupational History  . Not on file  Social Needs  . Financial resource strain: Not on file  . Food insecurity:    Worry: Never true    Inability: Never true  . Transportation needs:    Medical: No    Non-medical: No  Tobacco Use  . Smoking status: Never Smoker  . Smokeless tobacco: Never Used  Substance and Sexual Activity  . Alcohol use: No    Alcohol/week: 0.0 standard drinks  . Drug use: No  . Sexual activity: Not Currently  Lifestyle  . Physical activity:    Days per week: 0 days    Minutes per session: Not on file  . Stress: Not at all  Relationships  . Social connections:    Talks on phone: Patient refused    Gets together: Patient refused    Attends religious service: Patient refused    Active member of club or organization: Patient refused    Attends meetings of clubs or organizations: Patient refused    Relationship status: Patient refused  Other Topics Concern  . Not on file  Social History Narrative  . Not on file    Outpatient Encounter Medications as of  09/23/2018  Medication Sig  . amLODipine (NORVASC) 5 MG tablet TAKE 1 TABLET BY MOUTH TWICE A DAY  . aspirin 81 MG tablet Take 81 mg by mouth daily.  Marland Kitchen atenolol (TENORMIN) 50 MG tablet TAKE 2 TABLETS BY MOUTH EVERY DAY  . fexofenadine (ALLEGRA) 180 MG tablet Take 180 mg by mouth as needed.   . lactase (LACTAID) 3000 UNITS tablet Take 1 tablet by mouth as needed.  Marland Kitchen LORazepam (ATIVAN) 0.5 MG tablet Take 0.5 tablets (0.25 mg total) by mouth at bedtime as needed.  Marland Kitchen PREMARIN 0.3 MG tablet TAKE 1 TABLET (0.3 MG TOTAL) BY MOUTH DAILY.  . progesterone (PROMETRIUM) 100 MG capsule TAKE 1 CAPSULE BY MOUTH EVERY DAY  . [DISCONTINUED] rosuvastatin (CRESTOR) 10 MG tablet TAKE 1 TABLET BY MOUTH EVERY DAY   No facility-administered encounter medications on file as of 09/23/2018.     Activities of Daily Living In  your present state of health, do you have any difficulty performing the following activities: 09/23/2018 09/23/2017  Hearing? N N  Vision? N N  Difficulty concentrating or making decisions? N N  Walking or climbing stairs? N N  Dressing or bathing? N N  Doing errands, shopping? N N  Preparing Food and eating ? N N  Using the Toilet? N N  In the past six months, have you accidently leaked urine? N N  Do you have problems with loss of bowel control? N N  Managing your Medications? N N  Managing your Finances? N N  Housekeeping or managing your Housekeeping? N N  Some recent data might be hidden    Patient Care Team: Einar Pheasant, MD as PCP - General (Internal Medicine)    Assessment:   This is a routine wellness examination for [name].  [facility] information provided for financial assistance with medications per patient request.   Health Screenings  Mammogram-11/16/17 Colonoscopy-03/02/13 Bone Density-01/21/15 Glaucoma-none reported Hearing-passes the whisper test Hemoglobin A1C-09/21/18 (6.7) Cholesterol-09/21/18 (99)  Social  Alcohol intake-none Smoking history-none Smokers in home?no Illicit drug use?none Exercise-none. Plans to start gym exercises 1-3 days weekly, 30 min. Diet-regular Sexually Active-not currently Multiple Partners-no  Safety  Patient feels safe at home.  Patient does have smoke detectors at home  Patient does wear sunscreen or protective clothing when in direct sunlight. Patient does wear seat belt when driving or riding with others.   Activities of Daily Living Patient can do their own household chores. Denies needing assistance with: driving, feeding themselves, getting from bed to chair, getting to the toilet, bathing/showering, dressing, managing money, climbing flight of stairs, or preparing meals.   Depression Screen Patient denies losing interest in daily life, feeling hopeless, or crying easily over simple problems.     Fall Screen Patient denies being afraid of falling or falling in the last year.   Memory Screen Patient denies problems with memory, misplacing items, and is able to balance checkbook/bank accounts.  Patient is alert, normal appearance, oriented to person/place/and time. Correctly identified the president of the [place], recall of 2/3 objects, and performing simple calculations.  Patient displays appropriate judgement and can read correct time from watch face.   Immunizations The following Immunizations are up to date: Influenza, shingles and pneumonia. TDAP deferred per request.   Other Providers Patient Care Team: Einar Pheasant, MD as PCP - General (Internal Medicine)   Exercise Activities and Dietary recommendations Current Exercise Habits: The patient does not participate in regular exercise at present  Goals  Patient Stated   . Increase physical activity (pt-stated)     Start [employer] program with husband        Fall Risk Fall Risk  09/23/2018 09/23/2017 09/22/2016 01/22/2016 01/08/2015  Falls in the past year? 0 No No No No   Depression Screen PHQ 2/9 Scores 09/23/2018 09/23/2017 09/22/2016 01/22/2016  PHQ - 2 Score 0 0 0 0  PHQ- 9 Score - 0 - -     Cognitive Function     6CIT Screen 09/23/2018 09/23/2017 09/22/2016  What Year? 0 points 0 points 0 points  What month? 0 points 0 points 0 points  What time? 0 points 0 points 0 points  Count back from 20 0 points 0 points 0 points  Months in reverse 0 points 0 points 0 points  Repeat phrase 0 points 0 points -  Total Score 0 0 -    Immunization History  Administered Date(s) Administered  . Influenza Split 07/10/2013  . Influenza, High Dose Seasonal PF 10/30/2016, 09/10/2017, 09/06/2018  . Influenza,inj,Quad PF,6+ Mos 09/10/2014, 09/18/2015  . Influenza-Unspecified 07/25/2012, 07/11/2013  . Pneumococcal Conjugate-13 03/09/2017  . Pneumococcal Polysaccharide-23 05/18/2018   Screening Tests Health  Maintenance  Topic Date Due  . OPHTHALMOLOGY EXAM  04/30/1948  . TETANUS/TDAP  04/30/1957  . URINE MICROALBUMIN  05/15/2015  . MAMMOGRAM  11/16/2018  . FOOT EXAM  01/13/2019  . HEMOGLOBIN A1C  03/23/2019  . INFLUENZA VACCINE  Completed  . DEXA SCAN  Completed  . PNA vac Low Risk Adult  Completed       Plan:    End of life planning; Advanced aging; Advanced directives discussed.  No HCPOA/Living Will.  Additional information declined at this time.  I have personally reviewed and noted the following in the patient's chart:   . Medical and social history . Use of alcohol, tobacco or illicit drugs  . Current medications and supplements . Functional ability and status . Nutritional status . Physical activity . Advanced directives . List of other physicians . Hospitalizations, surgeries, and ER visits in previous 12 months . Vitals . Screenings to include cognitive, depression, and falls . Referrals and appointments  In addition, I have reviewed and discussed with patient certain preventive protocols, quality metrics, and best practice recommendations. A written personalized care plan for preventive services as well as general preventive health recommendations were provided to patient.     Varney Biles, LPN  [id number]   Reviewed above information.  Agree with assessment and plan.   Dr Nicki Reaper

## 2018-09-26 ENCOUNTER — Ambulatory Visit: Payer: Medicare Other

## 2018-10-01 ENCOUNTER — Encounter: Payer: Self-pay | Admitting: Internal Medicine

## 2018-10-01 NOTE — Assessment & Plan Note (Signed)
Low carb diet and exercise.  Follow met b and a1c.   

## 2018-10-01 NOTE — Assessment & Plan Note (Signed)
On crestor.  Low cholesterol diet and exercise.  Follow lipid panel and liver function tests.   

## 2018-10-01 NOTE — Assessment & Plan Note (Signed)
Have discussed trying to get her off estrogen.  She elects to remain on estrogen.  Understands risk and possible side effects of estrogen.

## 2018-10-01 NOTE — Assessment & Plan Note (Signed)
Blood pressure under good control.  Continue same medication regimen.  Follow pressures.  Follow metabolic panel.   

## 2018-10-01 NOTE — Assessment & Plan Note (Signed)
Follow vitamin D level.  

## 2018-11-15 ENCOUNTER — Other Ambulatory Visit: Payer: Self-pay | Admitting: Internal Medicine

## 2018-11-22 ENCOUNTER — Other Ambulatory Visit: Payer: Self-pay | Admitting: Internal Medicine

## 2018-11-22 MED ORDER — ATENOLOL 50 MG PO TABS: 100.0000 mg | ORAL_TABLET | Freq: Every day | ORAL | 1 refills | Status: DC

## 2018-11-22 NOTE — Addendum Note (Signed)
Addended by: Nanci Pina on: 11/22/2018 11:34 AM   Modules accepted: Orders

## 2018-12-07 ENCOUNTER — Ambulatory Visit
Admission: RE | Admit: 2018-12-07 | Discharge: 2018-12-07 | Disposition: A | Discharge status: Home / Self Care | Payer: Medicare Other | Source: Ambulatory Visit | Attending: Internal Medicine | Admitting: Internal Medicine

## 2018-12-07 DIAGNOSIS — Z1231 Encounter for screening mammogram for malignant neoplasm of breast: Secondary | ICD-10-CM | POA: Diagnosis not present

## 2019-01-21 ENCOUNTER — Other Ambulatory Visit: Payer: Self-pay | Admitting: Internal Medicine

## 2019-02-04 ENCOUNTER — Other Ambulatory Visit: Payer: Self-pay | Admitting: Internal Medicine

## 2019-04-05 ENCOUNTER — Other Ambulatory Visit: Payer: Self-pay | Admitting: Internal Medicine

## 2019-04-13 ENCOUNTER — Other Ambulatory Visit: Payer: Self-pay | Admitting: Internal Medicine

## 2019-04-22 ENCOUNTER — Other Ambulatory Visit: Payer: Self-pay | Admitting: Internal Medicine

## 2019-04-22 DIAGNOSIS — E785 Hyperlipidemia, unspecified: Secondary | ICD-10-CM

## 2019-05-22 ENCOUNTER — Other Ambulatory Visit: Payer: Self-pay | Admitting: Internal Medicine

## 2019-06-20 ENCOUNTER — Other Ambulatory Visit: Payer: Self-pay | Admitting: Internal Medicine

## 2019-07-18 ENCOUNTER — Other Ambulatory Visit: Payer: Self-pay | Admitting: Internal Medicine

## 2019-08-02 ENCOUNTER — Other Ambulatory Visit: Payer: Self-pay | Admitting: Internal Medicine

## 2019-08-03 NOTE — Telephone Encounter (Signed)
Last OV 05/18/2018 Next OV 09/24/19 Last refill 05/18/2018

## 2019-08-04 NOTE — Telephone Encounter (Signed)
rx ok'd for lorazepam #30 with no refills.   

## 2019-09-11 ENCOUNTER — Ambulatory Visit (INDEPENDENT_AMBULATORY_CARE_PROVIDER_SITE_OTHER): Payer: Medicare Other | Admitting: *Deleted

## 2019-09-11 ENCOUNTER — Other Ambulatory Visit: Payer: Self-pay

## 2019-09-11 DIAGNOSIS — Z23 Encounter for immunization: Secondary | ICD-10-CM

## 2019-09-26 ENCOUNTER — Ambulatory Visit: Payer: Medicare Other | Admitting: Internal Medicine

## 2019-09-26 ENCOUNTER — Other Ambulatory Visit: Payer: Self-pay | Admitting: Internal Medicine

## 2019-09-26 ENCOUNTER — Ambulatory Visit: Payer: Medicare Other

## 2019-09-26 DIAGNOSIS — E785 Hyperlipidemia, unspecified: Secondary | ICD-10-CM

## 2019-10-20 ENCOUNTER — Other Ambulatory Visit: Payer: Self-pay | Admitting: Internal Medicine

## 2019-10-20 DIAGNOSIS — E785 Hyperlipidemia, unspecified: Secondary | ICD-10-CM

## 2019-10-31 ENCOUNTER — Telehealth: Payer: Self-pay | Admitting: Internal Medicine

## 2019-10-31 NOTE — Telephone Encounter (Signed)
Please check with pharmacy - do they have preferred alternative.

## 2019-10-31 NOTE — Telephone Encounter (Signed)
Pt called to let Dr. Nicki Reaper know that her PREMARIN 0.3 MG tablet have gone up to $100 now and wants to know what can replaces this

## 2019-11-01 ENCOUNTER — Telehealth: Payer: Self-pay | Admitting: Internal Medicine

## 2019-11-01 NOTE — Telephone Encounter (Signed)
Spoke with pharmacist and she stated that there is not a generic for premarin but if you or the pt is willing you could switch to a different hormone that has a generic. She stated that she does not know the pt's formulary to be able to tell you which one would be covered.

## 2019-11-01 NOTE — Telephone Encounter (Signed)
LMTCB

## 2019-11-01 NOTE — Telephone Encounter (Signed)
Pt called in about Premarin. States it is too expensive now. I asked her if she called her insurance company and she did not seem to understand. Please call back

## 2019-11-01 NOTE — Telephone Encounter (Signed)
Called patient. Mailbox was full. Unable to leave message. There is also another phone note on this patient.

## 2019-11-01 NOTE — Telephone Encounter (Signed)
Have pt check with her insurance to see the preferred medication and let us know and we can change.

## 2019-11-18 ENCOUNTER — Other Ambulatory Visit: Payer: Self-pay | Admitting: Internal Medicine

## 2019-11-18 DIAGNOSIS — E785 Hyperlipidemia, unspecified: Secondary | ICD-10-CM

## 2019-11-19 NOTE — Telephone Encounter (Signed)
PT NEEDS APPT FOR FURTHER REFILLS. LAST SEEN IN 2019

## 2019-11-23 ENCOUNTER — Other Ambulatory Visit: Payer: Self-pay | Admitting: Internal Medicine

## 2019-12-21 ENCOUNTER — Other Ambulatory Visit: Payer: Self-pay | Admitting: Internal Medicine

## 2019-12-21 DIAGNOSIS — E785 Hyperlipidemia, unspecified: Secondary | ICD-10-CM

## 2019-12-29 ENCOUNTER — Other Ambulatory Visit: Payer: Self-pay | Admitting: Internal Medicine

## 2020-01-09 ENCOUNTER — Other Ambulatory Visit: Payer: Self-pay | Admitting: Internal Medicine

## 2020-01-09 DIAGNOSIS — E785 Hyperlipidemia, unspecified: Secondary | ICD-10-CM

## 2020-01-19 ENCOUNTER — Other Ambulatory Visit: Payer: Self-pay | Admitting: Internal Medicine

## 2020-02-03 ENCOUNTER — Other Ambulatory Visit: Payer: Self-pay | Admitting: Internal Medicine

## 2020-02-14 ENCOUNTER — Telehealth: Payer: Self-pay | Admitting: Internal Medicine

## 2020-02-14 NOTE — Telephone Encounter (Signed)
Home VM not set up. Left message on spouse phone for patient to call back and schedule Medicare Annual Wellness Visit (AWV) either virtually or audio only.  Last AWV 09/23/18 ; please schedule at anytime with Denisa O'Brien-Blaney at [facility]

## 2020-02-16 ENCOUNTER — Other Ambulatory Visit: Payer: Self-pay | Admitting: Internal Medicine

## 2020-02-16 DIAGNOSIS — E785 Hyperlipidemia, unspecified: Secondary | ICD-10-CM

## 2020-02-23 ENCOUNTER — Other Ambulatory Visit: Payer: Self-pay | Admitting: Internal Medicine

## 2020-02-28 ENCOUNTER — Other Ambulatory Visit: Payer: Self-pay | Admitting: Internal Medicine

## 2020-03-16 ENCOUNTER — Other Ambulatory Visit: Payer: Self-pay | Admitting: Internal Medicine

## 2020-03-16 DIAGNOSIS — E785 Hyperlipidemia, unspecified: Secondary | ICD-10-CM

## 2020-04-02 ENCOUNTER — Other Ambulatory Visit: Payer: Self-pay | Admitting: Internal Medicine

## 2020-04-08 ENCOUNTER — Other Ambulatory Visit: Payer: Self-pay | Admitting: Internal Medicine

## 2020-04-09 NOTE — Telephone Encounter (Signed)
Refill request for ativan, last seen 09-23-18, last filled 08-04-19.  Please advise.

## 2020-04-10 NOTE — Telephone Encounter (Signed)
rx ok'd for lorazepam #15 with no refills.  Pt overdue appt.  Needs physical scheduled.

## 2020-04-20 ENCOUNTER — Other Ambulatory Visit: Payer: Self-pay | Admitting: Internal Medicine

## 2020-04-20 DIAGNOSIS — E785 Hyperlipidemia, unspecified: Secondary | ICD-10-CM

## 2020-05-02 ENCOUNTER — Other Ambulatory Visit: Payer: Self-pay | Admitting: Internal Medicine

## 2020-05-03 ENCOUNTER — Other Ambulatory Visit: Payer: Self-pay | Admitting: Internal Medicine

## 2020-05-17 ENCOUNTER — Other Ambulatory Visit: Payer: Self-pay | Admitting: Internal Medicine

## 2020-05-17 DIAGNOSIS — E785 Hyperlipidemia, unspecified: Secondary | ICD-10-CM

## 2020-06-01 ENCOUNTER — Other Ambulatory Visit: Payer: Self-pay | Admitting: Internal Medicine

## 2020-06-21 ENCOUNTER — Other Ambulatory Visit: Payer: Self-pay | Admitting: Internal Medicine

## 2020-06-21 DIAGNOSIS — E785 Hyperlipidemia, unspecified: Secondary | ICD-10-CM

## 2020-06-29 ENCOUNTER — Other Ambulatory Visit: Payer: Self-pay | Admitting: Internal Medicine

## 2020-07-01 NOTE — Telephone Encounter (Signed)
Ok to refill? Pt has not been seen since 2019. Has an upcoming appointment for 08/2020

## 2020-07-02 NOTE — Telephone Encounter (Signed)
ok'd refill for amlodipine.  Keep appt.

## 2020-07-04 ENCOUNTER — Other Ambulatory Visit: Payer: Self-pay | Admitting: Internal Medicine

## 2020-07-04 NOTE — Telephone Encounter (Signed)
rx ok'd for lorazepam #15 with no refills.

## 2020-07-04 NOTE — Telephone Encounter (Signed)
Patient called in need refill on LORazepam (ATIVAN) 0.5 MG tablet and progesterone (PROMETRIUM) 100 MG capsule

## 2020-07-08 ENCOUNTER — Telehealth: Payer: Self-pay | Admitting: Internal Medicine

## 2020-07-08 ENCOUNTER — Other Ambulatory Visit: Payer: Self-pay | Admitting: Internal Medicine

## 2020-07-08 MED ORDER — PROGESTERONE MICRONIZED 100 MG PO CAPS: ORAL_CAPSULE | ORAL | 0 refills | Status: DC

## 2020-07-08 NOTE — Progress Notes (Signed)
rx sent in for prometrium #90 with no refills.

## 2020-07-08 NOTE — Telephone Encounter (Signed)
Patient called in need refill on progesterone (PROMETRIUM) 100 MG capsule she is out of them

## 2020-07-08 NOTE — Telephone Encounter (Signed)
Rx sent in for prometrium.

## 2020-07-09 NOTE — Telephone Encounter (Signed)
Noted  

## 2020-07-21 ENCOUNTER — Other Ambulatory Visit: Payer: Self-pay | Admitting: Internal Medicine

## 2020-07-21 DIAGNOSIS — E785 Hyperlipidemia, unspecified: Secondary | ICD-10-CM

## 2020-07-30 ENCOUNTER — Other Ambulatory Visit: Payer: Self-pay | Admitting: Internal Medicine

## 2020-08-18 ENCOUNTER — Other Ambulatory Visit: Payer: Self-pay | Admitting: Internal Medicine

## 2020-08-18 DIAGNOSIS — E785 Hyperlipidemia, unspecified: Secondary | ICD-10-CM

## 2020-08-20 ENCOUNTER — Ambulatory Visit (INDEPENDENT_AMBULATORY_CARE_PROVIDER_SITE_OTHER): Payer: Medicare Other | Admitting: Internal Medicine

## 2020-08-20 ENCOUNTER — Other Ambulatory Visit: Payer: Self-pay

## 2020-08-20 DIAGNOSIS — E7800 Pure hypercholesterolemia, unspecified: Secondary | ICD-10-CM

## 2020-08-20 DIAGNOSIS — R739 Hyperglycemia, unspecified: Secondary | ICD-10-CM | POA: Diagnosis not present

## 2020-08-20 DIAGNOSIS — Z1231 Encounter for screening mammogram for malignant neoplasm of breast: Secondary | ICD-10-CM | POA: Diagnosis not present

## 2020-08-20 DIAGNOSIS — I10 Essential (primary) hypertension: Secondary | ICD-10-CM

## 2020-08-20 DIAGNOSIS — Z79818 Long term (current) use of other agents affecting estrogen receptors and estrogen levels: Secondary | ICD-10-CM

## 2020-08-20 DIAGNOSIS — E559 Vitamin D deficiency, unspecified: Secondary | ICD-10-CM | POA: Diagnosis not present

## 2020-08-20 DIAGNOSIS — F439 Reaction to severe stress, unspecified: Secondary | ICD-10-CM

## 2020-08-20 DIAGNOSIS — E119 Type 2 diabetes mellitus without complications: Secondary | ICD-10-CM

## 2020-08-20 DIAGNOSIS — Z79899 Other long term (current) drug therapy: Secondary | ICD-10-CM

## 2020-08-20 NOTE — Progress Notes (Signed)
Patient ID: Kara Dixon, female   DOB: 10-20-1935, 84 y.o.   MRN: [id number]   Subjective:    Patient ID: Kara Dixon, female    DOB: Jun 08, 1936, 84 y.o.   MRN: [id number]  HPI This visit occurred during the SARS-CoV-2 public health emergency.  Safety protocols were in place, including screening questions prior to the visit, additional usage of staff PPE, and extensive cleaning of exam room while observing appropriate contact time as indicated for disinfecting solutions.  Patient here for a scheduled follow up.  Increased stress with family issues.  Concerned about her daughter's health, etc.  Discussed.  She does not feel needs any further intervention at this time.  Tries to stay active.  No chest pain or sob reported.  No abdominal pain or bowel change reported.  Received covid vaccine.  Wants flu vaccine today.    Past Medical History:  Diagnosis Date  . Cancer ([place])    skin  . Hypercholesterolemia   . Hypertension   . Nephrolithiasis   . Osteopenia   . Vitamin D deficiency    Past Surgical History:  Procedure Laterality Date  . cyst removed under tongue  age 66  . EYE SURGERY  02/2017   EYE LID; care everywhere   Family History  Problem Relation Age of Onset  . Stroke Mother   . Hypertension Mother   . Fibromyalgia Sister   . Breast cancer Neg Hx   . Colon cancer Neg Hx    Social History   Socioeconomic History  . Marital status: Married    Spouse name: Not on file  . Number of children: 1  . Years of education: Not on file  . Highest education level: Not on file  Occupational History  . Not on file  Tobacco Use  . Smoking status: Never Smoker  . Smokeless tobacco: Never Used  Substance and Sexual Activity  . Alcohol use: No    Alcohol/week: 0.0 standard drinks  . Drug use: No  . Sexual activity: Not Currently  Other Topics Concern  . Not on file  Social History Narrative  . Not on file   Social Determinants of Health   Financial Resource Strain:   .  Difficulty of Paying Living Expenses: Not on file  Food Insecurity:   . Worried About Charity fundraiser in the Last Year: Not on file  . Ran Out of Food in the Last Year: Not on file  Transportation Needs:   . Lack of Transportation (Medical): Not on file  . Lack of Transportation (Non-Medical): Not on file  Physical Activity:   . Days of Exercise per Week: Not on file  . Minutes of Exercise per Session: Not on file  Stress:   . Feeling of Stress : Not on file  Social Connections:   . Frequency of Communication with Friends and Family: Not on file  . Frequency of Social Gatherings with Friends and Family: Not on file  . Attends Religious Services: Not on file  . Active Member of Clubs or Organizations: Not on file  . Attends Archivist Meetings: Not on file  . Marital Status: Not on file    Outpatient Encounter Medications as of 08/20/2020  Medication Sig  . aspirin 81 MG tablet Take 81 mg by mouth daily.  Marland Kitchen atenolol (TENORMIN) 50 MG tablet TAKE 2 TABLETS BY MOUTH EVERY DAY  . fexofenadine (ALLEGRA) 180 MG tablet Take 180 mg by mouth as needed.   . lactase (  LACTAID) 3000 UNITS tablet Take 1 tablet by mouth as needed.  Marland Kitchen LORazepam (ATIVAN) 0.5 MG tablet TAKE 1/2 TAB BY MOUTH AT BEDTIME AS NEEDED  . PREMARIN 0.3 MG tablet TAKE 1 TABLET (0.3 MG TOTAL) BY MOUTH DAILY.  . progesterone (PROMETRIUM) 100 MG capsule TAKE 1 CAPSULE BY MOUTH EVERY DAY  . progesterone (PROMETRIUM) 100 MG capsule Take one capsule daily as directed.  . rosuvastatin (CRESTOR) 10 MG tablet TAKE 1 TABLET (10 MG TOTAL) BY MOUTH DAILY. PLEASE CALL FOR APPT FOR FURTHER REFILLS  . [DISCONTINUED] amLODipine (NORVASC) 5 MG tablet TAKE 1 TABLET BY MOUTH TWICE A DAY   No facility-administered encounter medications on file as of 08/20/2020.    Review of Systems  Constitutional: Negative for appetite change and unexpected weight change.  HENT: Negative for congestion and sinus pressure.   Respiratory:  Negative for cough, chest tightness and shortness of breath.   Cardiovascular: Negative for chest pain, palpitations and leg swelling.  Gastrointestinal: Negative for abdominal pain, diarrhea, nausea and vomiting.  Genitourinary: Negative for difficulty urinating and dysuria.  Musculoskeletal: Negative for joint swelling and myalgias.  Skin: Negative for color change and rash.  Neurological: Negative for dizziness, light-headedness and headaches.  Psychiatric/Behavioral: Negative for agitation and dysphoric mood.       Objective:    Physical Exam Vitals reviewed.  Constitutional:      General: She is not in acute distress.    Appearance: Normal appearance.  HENT:     Head: Normocephalic and atraumatic.     Right Ear: External ear normal.     Left Ear: External ear normal.  Eyes:     General: No scleral icterus.       Right eye: No discharge.        Left eye: No discharge.     Conjunctiva/sclera: Conjunctivae normal.  Neck:     Thyroid: No thyromegaly.  Cardiovascular:     Rate and Rhythm: Normal rate and regular rhythm.  Pulmonary:     Effort: No respiratory distress.     Breath sounds: Normal breath sounds. No wheezing.  Abdominal:     General: Bowel sounds are normal.     Palpations: Abdomen is soft.     Tenderness: There is no abdominal tenderness.  Musculoskeletal:        General: No swelling or tenderness.     Cervical back: Neck supple. No tenderness.  Lymphadenopathy:     Cervical: No cervical adenopathy.  Skin:    Findings: No erythema or rash.  Neurological:     Mental Status: She is alert.  Psychiatric:        Mood and Affect: Mood normal.        Behavior: Behavior normal.     BP 118/68   Pulse 66   Temp 97.8 F (36.6 C) (Oral)   Resp 16   Ht 5' (1.524 m)   Wt 140 lb 9.6 oz (63.8 kg)   SpO2 98%   BMI 27.46 kg/m  Wt Readings from Last 3 Encounters:  08/20/20 140 lb 9.6 oz (63.8 kg)  09/23/18 137 lb (62.1 kg)  09/23/18 137 lb 12.8 oz (62.5 kg)      Lab Results  Component Value Date   WBC 8.9 08/20/2020   HGB 13.1 08/20/2020   HCT 39.4 08/20/2020   PLT 253.0 08/20/2020   GLUCOSE 105 (H) 08/20/2020   CHOL 110 08/20/2020   TRIG 119.0 08/20/2020   HDL 44.00 08/20/2020   LDLDIRECT 139.4  05/12/2013   LDLCALC 42 08/20/2020   ALT 12 08/20/2020   AST 21 08/20/2020   NA 139 08/20/2020   K 4.1 08/20/2020   CL 105 08/20/2020   CREATININE 1.12 08/20/2020   BUN 15 08/20/2020   CO2 26 08/20/2020   TSH 3.65 08/20/2020   HGBA1C 6.7 (H) 08/20/2020   MICROALBUR 4.8 (H) 05/14/2014    MM 3D SCREEN BREAST BILATERAL  Result Date: 12/07/2018 CLINICAL DATA:  Screening. EXAM: DIGITAL SCREENING BILATERAL MAMMOGRAM WITH TOMO AND CAD COMPARISON:  Previous exam(s). ACR Breast Density Category b: There are scattered areas of fibroglandular density. FINDINGS: There are no findings suspicious for malignancy. Images were processed with CAD. IMPRESSION: No mammographic evidence of malignancy. A result letter of this screening mammogram will be mailed directly to the patient. RECOMMENDATION: Screening mammogram in one year. (Code:SM-B-01Y) BI-RADS CATEGORY  1: Negative. Electronically Signed   By: Fidela Salisbury M.D.   On: 12/07/2018 11:00       Assessment & Plan:   Problem List Items Addressed This Visit    Vitamin D deficiency    Follow vitamin D level.       Relevant Orders   VITAMIN D 25 Hydroxy (Vit-D Deficiency, Fractures) (Completed)   Stress    Increased stress as outlined.  Discussed.  Does not feel she needs any further intervention at this time.  Follow.       Hypertension    Remains on atenolol and amlodipine.  Blood pressure doing well.  Follow pressures.  Follow metabolic panel.       Relevant Orders   CBC with Differential/Platelet (Completed)   TSH (Completed)   Basic metabolic panel (Completed)   Hyperglycemia   Relevant Orders   Hemoglobin A1c (Completed)   Hypercholesterolemia    On crestor.  Low cholesterol  diet and exercise.  Follow lipid panel and liver function tests.        Relevant Orders   Hepatic function panel (Completed)   Lipid panel (Completed)   Diabetes mellitus without complication (HCC)    Low carb diet and exercise.  Follow met b and a1c.       Current use of estrogen therapy    Have tried to taper previously.  She prefers to remain on oral estrogen. Feels better on estrogen.  Understands risks and possible side effects of the medication.  Limits amount.  Follow.        Other Visit Diagnoses    Visit for screening mammogram       Relevant Orders   MM 3D SCREEN BREAST BILATERAL       Einar Pheasant, MD

## 2020-08-21 LAB — TSH: TSH: 3.65 u[IU]/mL (ref 0.35–4.50)

## 2020-08-21 LAB — CBC WITH DIFFERENTIAL/PLATELET
Basophils Absolute: 0.1 10*3/uL (ref 0.0–0.1)
Basophils Relative: 0.8 % (ref 0.0–3.0)
Eosinophils Absolute: 0.4 10*3/uL (ref 0.0–0.7)
Eosinophils Relative: 4.5 % (ref 0.0–5.0)
HCT: 39.4 % (ref 36.0–46.0)
Hemoglobin: 13.1 g/dL (ref 12.0–15.0)
Lymphocytes Relative: 41.4 % (ref 12.0–46.0)
Lymphs Abs: 3.7 10*3/uL (ref 0.7–4.0)
MCHC: 33.3 g/dL (ref 30.0–36.0)
MCV: 93.5 fl (ref 78.0–100.0)
Monocytes Absolute: 0.9 10*3/uL (ref 0.1–1.0)
Monocytes Relative: 9.7 % (ref 3.0–12.0)
Neutro Abs: 3.9 10*3/uL (ref 1.4–7.7)
Neutrophils Relative %: 43.6 % (ref 43.0–77.0)
Platelets: 253 10*3/uL (ref 150.0–400.0)
RBC: 4.21 Mil/uL (ref 3.87–5.11)
RDW: 12.5 % (ref 11.5–15.5)
WBC: 8.9 10*3/uL (ref 4.0–10.5)

## 2020-08-21 LAB — BASIC METABOLIC PANEL
BUN: 15 mg/dL (ref 6–23)
CO2: 26 mEq/L (ref 19–32)
Calcium: 9.3 mg/dL (ref 8.4–10.5)
Chloride: 105 mEq/L (ref 96–112)
Creatinine, Ser: 1.12 mg/dL (ref 0.40–1.20)
GFR: 45.86 mL/min — ABNORMAL LOW (ref >60.00)
Glucose, Bld: 105 mg/dL — ABNORMAL HIGH (ref 70–99)
Potassium: 4.1 mEq/L (ref 3.5–5.1)
Sodium: 139 mEq/L (ref 135–145)

## 2020-08-21 LAB — HEMOGLOBIN A1C: Hgb A1c MFr Bld: 6.7 % — ABNORMAL HIGH (ref 4.6–6.5)

## 2020-08-21 LAB — HEPATIC FUNCTION PANEL
ALT: 12 U/L (ref 0–35)
AST: 21 U/L (ref 0–37)
Albumin: 4.3 g/dL (ref 3.5–5.2)
Alkaline Phosphatase: 46 U/L (ref 39–117)
Bilirubin, Direct: 0.1 mg/dL (ref 0.0–0.3)
Total Bilirubin: 0.6 mg/dL (ref 0.2–1.2)
Total Protein: 7.4 g/dL (ref 6.0–8.3)

## 2020-08-21 LAB — VITAMIN D 25 HYDROXY (VIT D DEFICIENCY, FRACTURES): VITD: 36.55 ng/mL (ref 30.00–100.00)

## 2020-08-21 LAB — LIPID PANEL
Cholesterol: 110 mg/dL (ref 0–200)
HDL: 44 mg/dL (ref >39.00)
LDL Cholesterol: 42 mg/dL (ref 0–99)
NonHDL: 65.77
Total CHOL/HDL Ratio: 2
Triglycerides: 119 mg/dL (ref 0.0–149.0)
VLDL: 23.8 mg/dL (ref 0.0–40.0)

## 2020-08-22 ENCOUNTER — Other Ambulatory Visit: Payer: Self-pay | Admitting: Internal Medicine

## 2020-08-22 DIAGNOSIS — R944 Abnormal results of kidney function studies: Secondary | ICD-10-CM

## 2020-08-22 NOTE — Progress Notes (Signed)
Order placed for f/u met b 

## 2020-08-23 ENCOUNTER — Other Ambulatory Visit: Payer: Self-pay | Admitting: Internal Medicine

## 2020-08-25 ENCOUNTER — Encounter: Payer: Self-pay | Admitting: Internal Medicine

## 2020-08-25 DIAGNOSIS — F439 Reaction to severe stress, unspecified: Secondary | ICD-10-CM | POA: Insufficient documentation

## 2020-08-25 NOTE — Assessment & Plan Note (Signed)
Have tried to taper previously.  She prefers to remain on oral estrogen. Feels better on estrogen.  Understands risks and possible side effects of the medication.  Limits amount.  Follow.

## 2020-08-25 NOTE — Assessment & Plan Note (Signed)
Increased stress as outlined.  Discussed.  Does not feel she needs any further intervention at this time.  Follow.

## 2020-08-25 NOTE — Assessment & Plan Note (Signed)
Low carb diet and exercise.  Follow met b and a1c.

## 2020-08-25 NOTE — Assessment & Plan Note (Signed)
Follow vitamin D level.  

## 2020-08-25 NOTE — Assessment & Plan Note (Signed)
On crestor.  Low cholesterol diet and exercise.  Follow lipid panel and liver function tests.   

## 2020-08-25 NOTE — Assessment & Plan Note (Signed)
Remains on atenolol and amlodipine.  Blood pressure doing well.  Follow pressures.  Follow metabolic panel.

## 2020-09-19 ENCOUNTER — Telehealth: Payer: Self-pay | Admitting: Internal Medicine

## 2020-09-19 NOTE — Telephone Encounter (Signed)
Left message for patient to call back and schedule Medicare Annual Wellness Visit (AWV)   This should be a telephone visit only=30 minutes.  Last AWV 09/23/18; please schedule at anytime with Denisa O'Brien[facility]Blaney at [facility].

## 2020-09-21 ENCOUNTER — Other Ambulatory Visit: Payer: Self-pay | Admitting: Internal Medicine

## 2020-09-21 DIAGNOSIS — E785 Hyperlipidemia, unspecified: Secondary | ICD-10-CM

## 2020-10-05 ENCOUNTER — Other Ambulatory Visit: Payer: Self-pay | Admitting: Internal Medicine

## 2020-10-17 ENCOUNTER — Other Ambulatory Visit: Payer: Self-pay | Admitting: Internal Medicine

## 2020-10-23 ENCOUNTER — Other Ambulatory Visit: Payer: Self-pay | Admitting: Internal Medicine

## 2020-10-23 DIAGNOSIS — E785 Hyperlipidemia, unspecified: Secondary | ICD-10-CM

## 2020-11-08 ENCOUNTER — Other Ambulatory Visit: Payer: Self-pay | Admitting: Internal Medicine

## 2020-11-21 ENCOUNTER — Other Ambulatory Visit: Payer: Self-pay

## 2020-11-21 ENCOUNTER — Ambulatory Visit (INDEPENDENT_AMBULATORY_CARE_PROVIDER_SITE_OTHER): Payer: Medicare Other | Admitting: Internal Medicine

## 2020-11-21 ENCOUNTER — Encounter: Payer: Self-pay | Admitting: Internal Medicine

## 2020-11-21 VITALS — BP 118/64 | HR 57 | Temp 98.0°F | Resp 16 | Ht 60.0 in | Wt 139.0 lb

## 2020-11-21 DIAGNOSIS — M8580 Other specified disorders of bone density and structure, unspecified site: Secondary | ICD-10-CM | POA: Diagnosis not present

## 2020-11-21 DIAGNOSIS — E119 Type 2 diabetes mellitus without complications: Secondary | ICD-10-CM

## 2020-11-21 DIAGNOSIS — I10 Essential (primary) hypertension: Secondary | ICD-10-CM

## 2020-11-21 DIAGNOSIS — Z79899 Other long term (current) drug therapy: Secondary | ICD-10-CM

## 2020-11-21 DIAGNOSIS — E2839 Other primary ovarian failure: Secondary | ICD-10-CM | POA: Diagnosis not present

## 2020-11-21 DIAGNOSIS — Z0000 Encounter for general adult medical examination without abnormal findings: Secondary | ICD-10-CM

## 2020-11-21 DIAGNOSIS — F439 Reaction to severe stress, unspecified: Secondary | ICD-10-CM

## 2020-11-21 DIAGNOSIS — E7800 Pure hypercholesterolemia, unspecified: Secondary | ICD-10-CM

## 2020-11-21 DIAGNOSIS — Z79818 Long term (current) use of other agents affecting estrogen receptors and estrogen levels: Secondary | ICD-10-CM

## 2020-11-21 LAB — BASIC METABOLIC PANEL
BUN: 11 mg/dL (ref 6–23)
CO2: 28 mEq/L (ref 19–32)
Calcium: 9.7 mg/dL (ref 8.4–10.5)
Chloride: 105 mEq/L (ref 96–112)
Creatinine, Ser: 1.13 mg/dL (ref 0.40–1.20)
GFR: 45.29 mL/min — ABNORMAL LOW (ref >60.00)
Glucose, Bld: 109 mg/dL — ABNORMAL HIGH (ref 70–99)
Potassium: 4.6 mEq/L (ref 3.5–5.1)
Sodium: 138 mEq/L (ref 135–145)

## 2020-11-21 LAB — HEPATIC FUNCTION PANEL
ALT: 13 U/L (ref 0–35)
AST: 17 U/L (ref 0–37)
Albumin: 4.3 g/dL (ref 3.5–5.2)
Alkaline Phosphatase: 54 U/L (ref 39–117)
Bilirubin, Direct: 0.1 mg/dL (ref 0.0–0.3)
Total Bilirubin: 0.5 mg/dL (ref 0.2–1.2)
Total Protein: 7.4 g/dL (ref 6.0–8.3)

## 2020-11-21 LAB — MICROALBUMIN / CREATININE URINE RATIO
Creatinine,U: 495.4 mg/dL
Microalb Creat Ratio: 0.9 mg/g (ref 0.0–30.0)
Microalb, Ur: 4.3 mg/dL — ABNORMAL HIGH (ref 0.0–1.9)

## 2020-11-21 LAB — HM DIABETES FOOT EXAM

## 2020-11-21 LAB — LIPID PANEL
Cholesterol: 117 mg/dL (ref 0–200)
HDL: 47.7 mg/dL (ref >39.00)
LDL Cholesterol: 42 mg/dL (ref 0–99)
NonHDL: 69.26
Total CHOL/HDL Ratio: 2
Triglycerides: 136 mg/dL (ref 0.0–149.0)
VLDL: 27.2 mg/dL (ref 0.0–40.0)

## 2020-11-21 LAB — HEMOGLOBIN A1C: Hgb A1c MFr Bld: 6.6 % — ABNORMAL HIGH (ref 4.6–6.5)

## 2020-11-21 NOTE — Progress Notes (Signed)
Patient ID: Kara Dixon, female   DOB: Jun 24, 1936, 85 y.o.   MRN: [id number]   Subjective:    Patient ID: Kara Dixon, female    DOB: 1936/05/06, 85 y.o.   MRN: [id number]  HPI This visit occurred during the SARS-CoV-2 public health emergency.  Safety protocols were in place, including screening questions prior to the visit, additional usage of staff PPE, and extensive cleaning of exam room while observing appropriate contact time as indicated for disinfecting solutions.  Patient here for her physical exam.  She reports she is doing better.  Feels better. Handling stress.  Tries to stay active.  No chest pain or sob.  No acid reflux.  No abdominal pain.  Bowels moving.  Discussed continued estrogen.  She desires to remain on the premarin.     Past Medical History:  Diagnosis Date  . Cancer ([place])    skin  . Hypercholesterolemia   . Hypertension   . Nephrolithiasis   . Osteopenia   . Vitamin D deficiency    Past Surgical History:  Procedure Laterality Date  . cyst removed under tongue  age 89  . EYE SURGERY  02/2017   EYE LID; care everywhere   Family History  Problem Relation Age of Onset  . Stroke Mother   . Hypertension Mother   . Fibromyalgia Sister   . Breast cancer Neg Hx   . Colon cancer Neg Hx    Social History   Socioeconomic History  . Marital status: Married    Spouse name: Not on file  . Number of children: 1  . Years of education: Not on file  . Highest education level: Not on file  Occupational History  . Not on file  Tobacco Use  . Smoking status: Never Smoker  . Smokeless tobacco: Never Used  Substance and Sexual Activity  . Alcohol use: No    Alcohol/week: 0.0 standard drinks  . Drug use: No  . Sexual activity: Not Currently  Other Topics Concern  . Not on file  Social History Narrative  . Not on file   Social Determinants of Health   Financial Resource Strain: Not on file  Food Insecurity: Not on file  Transportation Needs: Not on file   Physical Activity: Not on file  Stress: Not on file  Social Connections: Not on file    Outpatient Encounter Medications as of 11/21/2020  Medication Sig  . amLODipine (NORVASC) 5 MG tablet TAKE 1 TABLET BY MOUTH TWICE A DAY  . aspirin 81 MG tablet Take 81 mg by mouth daily.  . fexofenadine (ALLEGRA) 180 MG tablet Take 180 mg by mouth as needed.   . lactase (LACTAID) 3000 UNITS tablet Take 1 tablet by mouth as needed.  Marland Kitchen LORazepam (ATIVAN) 0.5 MG tablet TAKE 1/2 TAB BY MOUTH AT BEDTIME AS NEEDED  . PREMARIN 0.3 MG tablet TAKE 1 TABLET (0.3 MG TOTAL) BY MOUTH DAILY.  . progesterone (PROMETRIUM) 100 MG capsule TAKE 1 CAPSULE BY MOUTH EVERY DAY  . [DISCONTINUED] atenolol (TENORMIN) 50 MG tablet TAKE 2 TABLETS BY MOUTH EVERY DAY  . [DISCONTINUED] progesterone (PROMETRIUM) 100 MG capsule TAKE ONE CAPSULE DAILY AS DIRECTED.  . [DISCONTINUED] rosuvastatin (CRESTOR) 10 MG tablet TAKE 1 TABLET (10 MG TOTAL) BY MOUTH DAILY. PLEASE CALL FOR APPT FOR FURTHER REFILLS   No facility-administered encounter medications on file as of 11/21/2020.    Review of Systems  Constitutional: Negative for appetite change and unexpected weight change.  HENT: Negative for congestion, sinus pressure  and sore throat.   Eyes: Negative for pain and visual disturbance.  Respiratory: Negative for cough, chest tightness and shortness of breath.   Cardiovascular: Negative for chest pain, palpitations and leg swelling.  Gastrointestinal: Negative for abdominal pain, diarrhea, nausea and vomiting.  Genitourinary: Negative for difficulty urinating and dysuria.  Musculoskeletal: Negative for joint swelling and myalgias.  Skin: Negative for color change and rash.  Neurological: Negative for dizziness, light-headedness and headaches.  Hematological: Negative for adenopathy. Does not bruise/bleed easily.  Psychiatric/Behavioral: Negative for agitation and dysphoric mood.       Objective:    Physical Exam Vitals reviewed.   Constitutional:      General: She is not in acute distress.    Appearance: Normal appearance. She is well-developed and well-nourished.  HENT:     Head: Normocephalic and atraumatic.     Right Ear: External ear normal.     Left Ear: External ear normal.     Mouth/Throat:     Mouth: Oropharynx is clear and moist.  Eyes:     General: No scleral icterus.       Right eye: No discharge.        Left eye: No discharge.     Conjunctiva/sclera: Conjunctivae normal.  Neck:     Thyroid: No thyromegaly.  Cardiovascular:     Rate and Rhythm: Normal rate and regular rhythm.  Pulmonary:     Effort: No tachypnea, accessory muscle usage or respiratory distress.     Breath sounds: Normal breath sounds. No decreased breath sounds or wheezing.  Chest:  Breasts:     Right: No inverted nipple, mass, nipple discharge or tenderness (no axillary adenopathy).     Left: No inverted nipple, mass, nipple discharge or tenderness (no axilarry adenopathy).    Abdominal:     General: Bowel sounds are normal.     Palpations: Abdomen is soft.     Tenderness: There is no abdominal tenderness.  Musculoskeletal:        General: No swelling, tenderness or edema.     Cervical back: Neck supple. No tenderness.  Lymphadenopathy:     Cervical: No cervical adenopathy.  Skin:    Findings: No erythema or rash.  Neurological:     Mental Status: She is alert and oriented to person, place, and time.  Psychiatric:        Mood and Affect: Mood and affect and mood normal.        Behavior: Behavior normal.     BP 118/64   Pulse (!) 57   Temp 98 F (36.7 C) (Oral)   Resp 16   Ht 5' (1.524 m)   Wt 139 lb (63 kg)   SpO2 98%   BMI 27.15 kg/m  Wt Readings from Last 3 Encounters:  11/21/20 139 lb (63 kg)  08/20/20 140 lb 9.6 oz (63.8 kg)  09/23/18 137 lb (62.1 kg)     Lab Results  Component Value Date   WBC 8.9 08/20/2020   HGB 13.1 08/20/2020   HCT 39.4 08/20/2020   PLT 253.0 08/20/2020   GLUCOSE 109  (H) 11/21/2020   CHOL 117 11/21/2020   TRIG 136.0 11/21/2020   HDL 47.70 11/21/2020   LDLDIRECT 139.4 05/12/2013   LDLCALC 42 11/21/2020   ALT 13 11/21/2020   AST 17 11/21/2020   NA 138 11/21/2020   K 4.6 11/21/2020   CL 105 11/21/2020   CREATININE 1.13 11/21/2020   BUN 11 11/21/2020   CO2 28 11/21/2020  TSH 3.65 08/20/2020   HGBA1C 6.6 (H) 11/21/2020   MICROALBUR 4.3 (H) 11/21/2020    MM 3D SCREEN BREAST BILATERAL  Result Date: 12/07/2018 CLINICAL DATA:  Screening. EXAM: DIGITAL SCREENING BILATERAL MAMMOGRAM WITH TOMO AND CAD COMPARISON:  Previous exam(s). ACR Breast Density Category b: There are scattered areas of fibroglandular density. FINDINGS: There are no findings suspicious for malignancy. Images were processed with CAD. IMPRESSION: No mammographic evidence of malignancy. A result letter of this screening mammogram will be mailed directly to the patient. RECOMMENDATION: Screening mammogram in one year. (Code:[id number]-B-01Y) BI-RADS CATEGORY  1: Negative. Electronically Signed   By: Fidela Salisbury M.D.   On: 12/07/2018 11:00       Assessment & Plan:   Problem List Items Addressed This Visit    Current use of estrogen therapy    She prefers to remain on oral estrogen.  Have tried to taper off previously.  Understands risks and possible side effects of estrogen therapy.  Follow.        Diabetes mellitus without complication (HCC)    Low carb diet and exercise.  Follow met b and a1c.        Relevant Orders   Hemoglobin A1c (Completed)   Basic metabolic panel (Completed)   Microalbumin / creatinine urine ratio (Completed)   Health care maintenance    Physical today 11/21/20.  Mammogram due.  Last 12/07/18 - [facility] I.  Ordered.  Need to schedule.  Bone density ordered to be done with mammogram.  Colonoscopy 02/2013.        Hypercholesterolemia    On crestor.  Low cholesterol diet and exercise.  Follow lipid panel and liver function tests.   Lab Results  Component Value  Date   CHOL 117 11/21/2020   HDL 47.70 11/21/2020   LDLCALC 42 11/21/2020   LDLDIRECT 139.4 05/12/2013   TRIG 136.0 11/21/2020   CHOLHDL 2 11/21/2020        Relevant Orders   Hepatic function panel (Completed)   Lipid panel (Completed)   Hypertension    Blood pressure doing well.  Continue atenolol and amlodipine.  Follow pressures. Follow metabolic panel.       Osteopenia    Discussed f/u bone density.  She is agreeable.  Schedule with mammogram.        Stress    Discussed today. Doing better.  Does not feel needs any further intervention.  Follow.        Other Visit Diagnoses    Estrogen deficiency    -  Primary   Relevant Orders   DG Bone Density       Einar Pheasant, MD

## 2020-11-21 NOTE — Assessment & Plan Note (Addendum)
Physical today 11/21/20.  Mammogram due.  Last 12/07/18 - Briads I.  Ordered.  Need to schedule.  Bone density ordered to be done with mammogram.  Colonoscopy 02/2013.

## 2020-11-22 ENCOUNTER — Other Ambulatory Visit: Payer: Self-pay | Admitting: Internal Medicine

## 2020-11-22 DIAGNOSIS — E785 Hyperlipidemia, unspecified: Secondary | ICD-10-CM

## 2020-11-23 ENCOUNTER — Encounter: Payer: Self-pay | Admitting: Internal Medicine

## 2020-11-24 ENCOUNTER — Encounter: Payer: Self-pay | Admitting: Internal Medicine

## 2020-11-24 ENCOUNTER — Telehealth: Payer: Self-pay | Admitting: Internal Medicine

## 2020-11-24 NOTE — Telephone Encounter (Signed)
Last mammogram 12/2018.  She is on estrogen.  Needs to have mammogram and bone density scheduled.  Both are ordered.  When contact her with mammogram and bone density dates, please also get her covid booster date.  Thanks.

## 2020-11-24 NOTE — Assessment & Plan Note (Signed)
Discussed today. Doing better.  Does not feel needs any further intervention.  Follow.

## 2020-11-24 NOTE — Assessment & Plan Note (Signed)
She prefers to remain on oral estrogen.  Have tried to taper off previously.  Understands risks and possible side effects of estrogen therapy.  Follow.

## 2020-11-24 NOTE — Assessment & Plan Note (Signed)
Blood pressure doing well.  Continue atenolol and amlodipine.  Follow pressures. Follow metabolic panel.

## 2020-11-24 NOTE — Assessment & Plan Note (Signed)
Low carb diet and exercise.  Follow met b and a1c.   

## 2020-11-24 NOTE — Assessment & Plan Note (Signed)
On crestor.  Low cholesterol diet and exercise.  Follow lipid panel and liver function tests.   Lab Results  Component Value Date   CHOL 117 11/21/2020   HDL 47.70 11/21/2020   LDLCALC 42 11/21/2020   LDLDIRECT 139.4 05/12/2013   TRIG 136.0 11/21/2020   CHOLHDL 2 11/21/2020

## 2020-11-24 NOTE — Assessment & Plan Note (Signed)
Discussed f/u bone density.  She is agreeable.  Schedule with mammogram.

## 2020-11-25 NOTE — Telephone Encounter (Signed)
Mammogram and bone density scheduled. Unable to leave message for patient

## 2020-11-27 NOTE — Telephone Encounter (Signed)
Called patient. Mail box full.

## 2020-11-29 NOTE — Telephone Encounter (Signed)
Called patient unable to leave message

## 2020-12-08 ENCOUNTER — Other Ambulatory Visit: Payer: Self-pay | Admitting: Internal Medicine

## 2020-12-17 ENCOUNTER — Other Ambulatory Visit: Payer: Self-pay

## 2020-12-17 ENCOUNTER — Ambulatory Visit
Admission: RE | Admit: 2020-12-17 | Discharge: 2020-12-17 | Disposition: A | Discharge status: Home / Self Care | Payer: Medicare Other | Source: Ambulatory Visit | Attending: Internal Medicine | Admitting: Internal Medicine

## 2020-12-17 DIAGNOSIS — Z780 Asymptomatic menopausal state: Secondary | ICD-10-CM | POA: Diagnosis not present

## 2020-12-17 DIAGNOSIS — E2839 Other primary ovarian failure: Secondary | ICD-10-CM | POA: Diagnosis not present

## 2020-12-17 DIAGNOSIS — Z1231 Encounter for screening mammogram for malignant neoplasm of breast: Secondary | ICD-10-CM | POA: Insufficient documentation

## 2020-12-17 DIAGNOSIS — M8589 Other specified disorders of bone density and structure, multiple sites: Secondary | ICD-10-CM | POA: Diagnosis not present

## 2020-12-19 ENCOUNTER — Other Ambulatory Visit: Payer: Self-pay | Admitting: Internal Medicine

## 2020-12-19 DIAGNOSIS — E785 Hyperlipidemia, unspecified: Secondary | ICD-10-CM

## 2020-12-24 ENCOUNTER — Other Ambulatory Visit: Payer: Self-pay | Admitting: Internal Medicine

## 2020-12-25 NOTE — Telephone Encounter (Signed)
rx sent in for lorazepam #15 with no refills.

## 2021-01-07 ENCOUNTER — Other Ambulatory Visit: Payer: Self-pay | Admitting: Internal Medicine

## 2021-01-10 DIAGNOSIS — H2513 Age-related nuclear cataract, bilateral: Secondary | ICD-10-CM | POA: Diagnosis not present

## 2021-01-15 ENCOUNTER — Other Ambulatory Visit: Payer: Self-pay | Admitting: Internal Medicine

## 2021-01-15 DIAGNOSIS — E785 Hyperlipidemia, unspecified: Secondary | ICD-10-CM

## 2021-01-29 DIAGNOSIS — H2511 Age-related nuclear cataract, right eye: Secondary | ICD-10-CM | POA: Diagnosis not present

## 2021-01-30 ENCOUNTER — Other Ambulatory Visit: Payer: Self-pay | Admitting: Internal Medicine

## 2021-02-04 ENCOUNTER — Other Ambulatory Visit: Payer: Self-pay

## 2021-02-04 ENCOUNTER — Encounter: Payer: Self-pay | Admitting: Ophthalmology

## 2021-02-06 NOTE — Discharge Instructions (Signed)

## 2021-02-09 ENCOUNTER — Other Ambulatory Visit: Payer: Self-pay | Admitting: Internal Medicine

## 2021-02-09 DIAGNOSIS — E785 Hyperlipidemia, unspecified: Secondary | ICD-10-CM

## 2021-02-11 ENCOUNTER — Encounter: Payer: Self-pay | Admitting: Ophthalmology

## 2021-02-11 ENCOUNTER — Ambulatory Visit: Payer: Medicare Other | Admitting: Anesthesiology

## 2021-02-11 ENCOUNTER — Other Ambulatory Visit: Payer: Self-pay

## 2021-02-11 ENCOUNTER — Ambulatory Visit
Admission: RE | Admit: 2021-02-11 | Discharge: 2021-02-11 | Disposition: A | Discharge status: Home / Self Care | Payer: Medicare Other | Attending: Ophthalmology | Admitting: Ophthalmology

## 2021-02-11 ENCOUNTER — Encounter
Admission: RE | Disposition: A | Discharge status: Home / Self Care | Payer: Self-pay | Source: Home / Self Care | Attending: Ophthalmology

## 2021-02-11 DIAGNOSIS — E1136 Type 2 diabetes mellitus with diabetic cataract: Secondary | ICD-10-CM | POA: Diagnosis not present

## 2021-02-11 DIAGNOSIS — H25811 Combined forms of age-related cataract, right eye: Secondary | ICD-10-CM | POA: Diagnosis not present

## 2021-02-11 DIAGNOSIS — Z881 Allergy status to other antibiotic agents status: Secondary | ICD-10-CM | POA: Insufficient documentation

## 2021-02-11 DIAGNOSIS — Z888 Allergy status to other drugs, medicaments and biological substances status: Secondary | ICD-10-CM | POA: Diagnosis not present

## 2021-02-11 DIAGNOSIS — Z7982 Long term (current) use of aspirin: Secondary | ICD-10-CM | POA: Diagnosis not present

## 2021-02-11 DIAGNOSIS — Z79899 Other long term (current) drug therapy: Secondary | ICD-10-CM | POA: Insufficient documentation

## 2021-02-11 DIAGNOSIS — Z880 Allergy status to penicillin: Secondary | ICD-10-CM | POA: Diagnosis not present

## 2021-02-11 DIAGNOSIS — Z882 Allergy status to sulfonamides status: Secondary | ICD-10-CM | POA: Diagnosis not present

## 2021-02-11 DIAGNOSIS — H2511 Age-related nuclear cataract, right eye: Secondary | ICD-10-CM | POA: Diagnosis not present

## 2021-02-11 HISTORY — PX: CATARACT EXTRACTION W/PHACO: SHX586

## 2021-02-11 SURGERY — PHACOEMULSIFICATION, CATARACT, WITH IOL INSERTION
Anesthesia: Monitor Anesthesia Care | Site: Eye | Laterality: Right

## 2021-02-11 MED ORDER — FENTANYL CITRATE (PF) 100 MCG/2ML IJ SOLN
INTRAMUSCULAR | Status: DC | PRN
  Administered 2021-02-11: 50 ug via INTRAVENOUS

## 2021-02-11 MED ORDER — BRIMONIDINE TARTRATE-TIMOLOL 0.2-0.5 % OP SOLN
OPHTHALMIC | Status: DC | PRN
  Administered 2021-02-11: 1 [drp] via OPHTHALMIC

## 2021-02-11 MED ORDER — ONDANSETRON HCL 4 MG/2ML IJ SOLN: 4.0000 mg | Freq: Once | INTRAMUSCULAR | Status: DC | PRN

## 2021-02-11 MED ORDER — TETRACAINE HCL 0.5 % OP SOLN
1.0000 [drp] | OPHTHALMIC | Status: DC | PRN
  Administered 2021-02-11 (×3): 1 [drp] via OPHTHALMIC

## 2021-02-11 MED ORDER — POLYMYXIN B-TRIMETHOPRIM 10000-0.1 UNIT/ML-% OP SOLN
OPHTHALMIC | Status: DC | PRN
  Administered 2021-02-11: 1 [drp] via OPHTHALMIC

## 2021-02-11 MED ORDER — ARMC OPHTHALMIC DILATING DROPS
1.0000 "application " | OPHTHALMIC | Status: DC | PRN
  Administered 2021-02-11 (×3): 1 via OPHTHALMIC

## 2021-02-11 MED ORDER — ACETAMINOPHEN 10 MG/ML IV SOLN: 1000.0000 mg | Freq: Once | INTRAVENOUS | Status: DC | PRN

## 2021-02-11 MED ORDER — ACETAMINOPHEN 325 MG PO TABS
650.0000 mg | ORAL_TABLET | Freq: Four times a day (QID) | ORAL | Status: DC | PRN
  Administered 2021-02-11: 650 mg via ORAL

## 2021-02-11 MED ORDER — LACTATED RINGERS IV SOLN: INTRAVENOUS | Status: DC

## 2021-02-11 MED ORDER — MIDAZOLAM HCL 2 MG/2ML IJ SOLN
INTRAMUSCULAR | Status: DC | PRN
  Administered 2021-02-11: 1 mg via INTRAVENOUS

## 2021-02-11 MED ORDER — LIDOCAINE HCL (PF) 2 % IJ SOLN
INTRAOCULAR | Status: DC | PRN
  Administered 2021-02-11: 1 mL via INTRAMUSCULAR

## 2021-02-11 MED ORDER — NA CHONDROIT SULF-NA HYALURON 40-17 MG/ML IO SOLN
INTRAOCULAR | Status: DC | PRN
  Administered 2021-02-11: 1 mL via INTRAOCULAR

## 2021-02-11 MED ORDER — EPINEPHRINE PF 1 MG/ML IJ SOLN
INTRAOCULAR | Status: DC | PRN
  Administered 2021-02-11: 55 mL via OPHTHALMIC

## 2021-02-11 SURGICAL SUPPLY — 17 items

## 2021-02-11 NOTE — Transfer of Care (Signed)
Immediate Anesthesia Transfer of Care Note  Patient: Kara Dixon  Procedure(s) Performed: CATARACT EXTRACTION PHACO AND INTRAOCULAR LENS PLACEMENT (IOC) RIGHT (Right Eye)  Patient Location: PACU  Anesthesia Type: MAC  Level of Consciousness: awake, alert  and patient cooperative  Airway and Oxygen Therapy: Patient Spontanous Breathing and Patient connected to supplemental oxygen  Post-op Assessment: Post-op Vital signs reviewed, Patient's Cardiovascular Status Stable, Respiratory Function Stable, Patent Airway and No signs of Nausea or vomiting  Post-op Vital Signs: Reviewed and stable  Complications: No complications documented.

## 2021-02-11 NOTE — Anesthesia Procedure Notes (Signed)
Procedure Name: MAC Date/Time: 02/11/2021 11:14 AM Performed by: Cameron Ali, CRNA Pre-anesthesia Checklist: Patient identified, Emergency Drugs available, Suction available, Timeout performed and Patient being monitored Patient Re-evaluated:Patient Re-evaluated prior to induction Oxygen Delivery Method: Nasal cannula Placement Confirmation: positive ETCO2

## 2021-02-11 NOTE — Op Note (Signed)
PREOPERATIVE DIAGNOSIS:  Nuclear sclerotic cataract of the right eye.   POSTOPERATIVE DIAGNOSIS:  H25.11 Cataract   OPERATIVE PROCEDURE:@   SURGEON:  Birder Robson, MD.   ANESTHESIA:  Anesthesiologist: Heniser, Fredric Dine, MD CRNA: Cameron Ali, CRNA  1.      Managed anesthesia care. 2.      0.38ml of Shugarcaine was instilled in the eye following the paracentesis.   COMPLICATIONS:  None.   TECHNIQUE:   Stop and chop   DESCRIPTION OF PROCEDURE:  The patient was examined and consented in the preoperative holding area where the aforementioned topical anesthesia was applied to the right eye and then brought back to the Operating Room where the right eye was prepped and draped in the usual sterile ophthalmic fashion and a lid speculum was placed. A paracentesis was created with the side port blade and the anterior chamber was filled with viscoelastic. A near clear corneal incision was performed with the steel keratome. A continuous curvilinear capsulorrhexis was performed with a cystotome followed by the capsulorrhexis forceps. Hydrodissection and hydrodelineation were carried out with BSS on a blunt cannula. The lens was removed in a stop and chop  technique and the remaining cortical material was removed with the irrigation-aspiration handpiece. The capsular bag was inflated with viscoelastic and the Technis [id number]  lens was placed in the capsular bag without complication. The remaining viscoelastic was removed from the eye with the irrigation-aspiration handpiece. The wounds were hydrated. The anterior chamber was flushed with BSS and the eye was inflated to physiologic pressure. 0.73ml of Vigamox was placed in the anterior chamber. The wounds were found to be water tight. The eye was dressed with Combigan. The patient was given protective glasses to wear throughout the day and a shield with which to sleep tonight. The patient was also given drops with which to begin a drop regimen today and will  follow-up with me in one day. Implant Name Type Inv. Item Serial No. Manufacturer Lot No. LRB No. Used Action  LENS IOL TECNIS EYHANCE 20.0 - [id number] Intraocular Lens LENS IOL TECNIS EYHANCE 20.0 [id number] JOHNSON   Right 1 Implanted   Procedure(s) with comments: CATARACT EXTRACTION PHACO AND INTRAOCULAR LENS PLACEMENT (IOC) RIGHT (Right) - [id number] 01:16.1 minutes  Electronically signed: Birder Robson 02/11/2021 11:33 AM

## 2021-02-11 NOTE — Anesthesia Preprocedure Evaluation (Addendum)
Anesthesia Evaluation  Patient identified by MRN, date of birth, ID band Patient awake    Reviewed: Allergy & Precautions, NPO status , Patient's Chart, lab work & pertinent test results, reviewed documented beta blocker date and time   History of Anesthesia Complications Negative for: history of anesthetic complications  Airway Mallampati: III  TM Distance: >3 FB Neck ROM: Limited  Mouth opening: Limited Mouth Opening  Dental   Pulmonary    breath sounds clear to auscultation       Cardiovascular hypertension, (-) angina(-) DOE  Rhythm:Regular Rate:Normal   HLD   Neuro/Psych    GI/Hepatic neg GERD  , Diverticulosis   Endo/Other  diabetes  Renal/GU Renal disease ([place])     Musculoskeletal   Abdominal   Peds  Hematology   Anesthesia Other Findings Skin cancer  Reproductive/Obstetrics                            Anesthesia Physical Anesthesia Plan  ASA: II  Anesthesia Plan: MAC   Post-op Pain Management:    Induction: Intravenous  PONV Risk Score and Plan: 2 and TIVA, Midazolam and Treatment may vary due to age or medical condition  Airway Management Planned: Nasal Cannula  Additional Equipment:   Intra-op Plan:   Post-operative Plan:   Informed Consent: I have reviewed the patients History and Physical, chart, labs and discussed the procedure including the risks, benefits and alternatives for the proposed anesthesia with the patient or authorized representative who has indicated his/her understanding and acceptance.       Plan Discussed with: CRNA and Anesthesiologist  Anesthesia Plan Comments:         Anesthesia Quick Evaluation

## 2021-02-11 NOTE — Anesthesia Postprocedure Evaluation (Signed)
Anesthesia Post Note  Patient: Kara Dixon  Procedure(s) Performed: CATARACT EXTRACTION PHACO AND INTRAOCULAR LENS PLACEMENT (IOC) RIGHT (Right Eye)     Patient location during evaluation: PACU Anesthesia Type: MAC Level of consciousness: awake and alert Pain management: pain level controlled Vital Signs Assessment: post-procedure vital signs reviewed and stable Respiratory status: spontaneous breathing, nonlabored ventilation, respiratory function stable and patient connected to nasal cannula oxygen Cardiovascular status: stable and blood pressure returned to baseline Postop Assessment: no apparent nausea or vomiting Anesthetic complications: no   No complications documented.  Lakisa Lotz A  Kylor Valverde

## 2021-02-11 NOTE — H&P (Signed)
[facility]   Primary Care Physician:  Einar Pheasant, MD Ophthalmologist: Dr. George Ina  Pre-Procedure History & Physical: HPI:  Kara Dixon is a 85 y.o. female here for cataract surgery.   Past Medical History:  Diagnosis Date  . Cancer ([place])    skin  . Hypercholesterolemia   . Hypertension   . Nephrolithiasis   . Osteopenia   . Vitamin D deficiency     Past Surgical History:  Procedure Laterality Date  . cyst removed under tongue  age 52  . EYE SURGERY  02/2017   EYE LID; care everywhere    Prior to Admission medications   Medication Sig Start Date End Date Taking? Authorizing Provider  amLODipine (NORVASC) 5 MG tablet TAKE 1 TABLET BY MOUTH TWICE A DAY 01/30/21  Yes Einar Pheasant, MD  aspirin 81 MG tablet Take 81 mg by mouth daily.   Yes [provider]  atenolol (TENORMIN) 50 MG tablet TAKE 2 TABLETS BY MOUTH EVERY DAY 02/10/21  Yes Einar Pheasant, MD  fexofenadine (ALLEGRA) 180 MG tablet Take 180 mg by mouth as needed.    Yes [provider]  lactase (LACTAID) 3000 UNITS tablet Take 1 tablet by mouth as needed.   Yes [provider]  LORazepam (ATIVAN) 0.5 MG tablet TAKE 1/2 TAB BY MOUTH AT BEDTIME AS NEEDED 12/25/20  Yes Scott, Randell Patient, MD  PREMARIN 0.3 MG tablet TAKE 1 TABLET (0.3 MG TOTAL) BY MOUTH DAILY. 05/02/20  Yes Einar Pheasant, MD  progesterone (PROMETRIUM) 100 MG capsule TAKE 1 CAPSULE BY MOUTH EVERY DAY 07/19/19  Yes Einar Pheasant, MD  rosuvastatin (CRESTOR) 10 MG tablet TAKE 1 TABLET (10 MG TOTAL) BY MOUTH DAILY. PLEASE CALL FOR APPT FOR FURTHER REFILLS 02/10/21  Yes Einar Pheasant, MD    Allergies as of 01/16/2021 - Review Complete 11/24/2020  Allergen Reaction Noted  . Avelox [moxifloxacin hcl in nacl]  10/16/2012  . Mucinex [guaifenesin er] Other (See Comments) 10/16/2012  . Penicillins  10/16/2012  . Sulfa antibiotics  10/16/2012    Family History  Problem Relation Age of Onset  . Stroke Mother   .  Hypertension Mother   . Fibromyalgia Sister   . Breast cancer Neg Hx   . Colon cancer Neg Hx     Social History   Socioeconomic History  . Marital status: Married    Spouse name: Not on file  . Number of children: 1  . Years of education: Not on file  . Highest education level: Not on file  Occupational History  . Not on file  Tobacco Use  . Smoking status: Never Smoker  . Smokeless tobacco: Never Used  Vaping Use  . Vaping Use: Never used  Substance and Sexual Activity  . Alcohol use: No    Alcohol/week: 0.0 standard drinks  . Drug use: No  . Sexual activity: Not Currently  Other Topics Concern  . Not on file  Social History Narrative  . Not on file   Social Determinants of Health   Financial Resource Strain: Not on file  Food Insecurity: Not on file  Transportation Needs: Not on file  Physical Activity: Not on file  Stress: Not on file  Social Connections: Not on file  Intimate Partner Violence: Not on file    Review of Systems: See HPI, otherwise negative ROS  Physical Exam: BP (!) 146/61   Pulse (!) 58   Temp (!) 97 F (36.1 C) (Temporal)   Resp 18   Ht 5' (1.524  m)   Wt 62.6 kg   SpO2 97%   BMI 26.93 kg/m  General:   Alert,  pleasant and cooperative in NAD Head:  Normocephalic and atraumatic. Respiratory:  Normal work of breathing. Cardiovascular:  RRR  Impression/Plan: Kara Dixon is here for cataract surgery.  Risks, benefits, limitations, and alternatives regarding cataract surgery have been reviewed with the patient.  Questions have been answered.  All parties agreeable.   Birder Robson, MD  02/11/2021, 11:05 AM

## 2021-02-12 ENCOUNTER — Encounter: Payer: Self-pay | Admitting: Ophthalmology

## 2021-02-17 ENCOUNTER — Telehealth: Payer: Self-pay | Admitting: Internal Medicine

## 2021-02-17 DIAGNOSIS — H2512 Age-related nuclear cataract, left eye: Secondary | ICD-10-CM | POA: Diagnosis not present

## 2021-02-17 NOTE — Telephone Encounter (Signed)
Left message for patient to call back and schedule Medicare Annual Wellness Visit (AWV) in office.   If not able to come in office, please offer to do virtually or by telephone.   Last AWV:09/23/2018   Please schedule at anytime with Nurse Health Advisor.

## 2021-02-23 NOTE — Anesthesia Preprocedure Evaluation (Addendum)
Anesthesia Evaluation  Patient identified by MRN, date of birth, ID band Patient awake    Reviewed: Allergy & Precautions, NPO status , Patient's Chart, lab work & pertinent test results  History of Anesthesia Complications Negative for: history of anesthetic complications  Airway Mallampati: IV   Neck ROM: Full    Dental  (+) Caps,    Pulmonary neg pulmonary ROS,    Pulmonary exam normal breath sounds clear to auscultation       Cardiovascular Exercise Tolerance: Good hypertension, Normal cardiovascular exam Rhythm:Regular Rate:Normal     Neuro/Psych negative neurological ROS     GI/Hepatic negative GI ROS,   Endo/Other  negative endocrine ROS  Renal/GU Renal disease (nephrolithiasis)     Musculoskeletal   Abdominal   Peds  Hematology Skin CA   Anesthesia Other Findings   Reproductive/Obstetrics                            Anesthesia Physical Anesthesia Plan  ASA: II  Anesthesia Plan: MAC   Post-op Pain Management:    Induction: Intravenous  PONV Risk Score and Plan: 2 and TIVA, Midazolam and Treatment may vary due to age or medical condition  Airway Management Planned: Nasal Cannula  Additional Equipment:   Intra-op Plan:   Post-operative Plan:   Informed Consent: I have reviewed the patients History and Physical, chart, labs and discussed the procedure including the risks, benefits and alternatives for the proposed anesthesia with the patient or authorized representative who has indicated his/her understanding and acceptance.       Plan Discussed with: CRNA  Anesthesia Plan Comments:        Anesthesia Quick Evaluation

## 2021-02-24 NOTE — Discharge Instructions (Signed)

## 2021-02-25 ENCOUNTER — Other Ambulatory Visit: Payer: Self-pay

## 2021-02-25 ENCOUNTER — Encounter
Admission: RE | Disposition: A | Discharge status: Home / Self Care | Payer: Self-pay | Source: Home / Self Care | Attending: Ophthalmology

## 2021-02-25 ENCOUNTER — Ambulatory Visit: Payer: Medicare Other | Admitting: Anesthesiology

## 2021-02-25 ENCOUNTER — Encounter: Payer: Self-pay | Admitting: Ophthalmology

## 2021-02-25 ENCOUNTER — Ambulatory Visit
Admission: RE | Admit: 2021-02-25 | Discharge: 2021-02-25 | Disposition: A | Discharge status: Home / Self Care | Payer: Medicare Other | Attending: Ophthalmology | Admitting: Ophthalmology

## 2021-02-25 DIAGNOSIS — Z881 Allergy status to other antibiotic agents status: Secondary | ICD-10-CM | POA: Insufficient documentation

## 2021-02-25 DIAGNOSIS — Z7982 Long term (current) use of aspirin: Secondary | ICD-10-CM | POA: Insufficient documentation

## 2021-02-25 DIAGNOSIS — Z961 Presence of intraocular lens: Secondary | ICD-10-CM | POA: Insufficient documentation

## 2021-02-25 DIAGNOSIS — Z9841 Cataract extraction status, right eye: Secondary | ICD-10-CM | POA: Diagnosis not present

## 2021-02-25 DIAGNOSIS — H2512 Age-related nuclear cataract, left eye: Secondary | ICD-10-CM | POA: Insufficient documentation

## 2021-02-25 DIAGNOSIS — Z882 Allergy status to sulfonamides status: Secondary | ICD-10-CM | POA: Insufficient documentation

## 2021-02-25 DIAGNOSIS — Z880 Allergy status to penicillin: Secondary | ICD-10-CM | POA: Diagnosis not present

## 2021-02-25 DIAGNOSIS — H25812 Combined forms of age-related cataract, left eye: Secondary | ICD-10-CM | POA: Diagnosis not present

## 2021-02-25 DIAGNOSIS — Z79899 Other long term (current) drug therapy: Secondary | ICD-10-CM | POA: Insufficient documentation

## 2021-02-25 DIAGNOSIS — Z79890 Hormone replacement therapy (postmenopausal): Secondary | ICD-10-CM | POA: Diagnosis not present

## 2021-02-25 HISTORY — PX: CATARACT EXTRACTION W/PHACO: SHX586

## 2021-02-25 SURGERY — PHACOEMULSIFICATION, CATARACT, WITH IOL INSERTION
Anesthesia: Monitor Anesthesia Care | Site: Eye | Laterality: Left

## 2021-02-25 MED ORDER — NA CHONDROIT SULF-NA HYALURON 40-17 MG/ML IO SOLN
INTRAOCULAR | Status: DC | PRN
  Administered 2021-02-25: 1 mL via INTRAOCULAR

## 2021-02-25 MED ORDER — TETRACAINE HCL 0.5 % OP SOLN
1.0000 [drp] | OPHTHALMIC | Status: DC | PRN
  Administered 2021-02-25 (×3): 1 [drp] via OPHTHALMIC

## 2021-02-25 MED ORDER — FENTANYL CITRATE (PF) 100 MCG/2ML IJ SOLN
INTRAMUSCULAR | Status: DC | PRN
  Administered 2021-02-25: 50 ug via INTRAVENOUS
  Administered 2021-02-25: 25 ug via INTRAVENOUS

## 2021-02-25 MED ORDER — MOXIFLOXACIN HCL 0.5 % OP SOLN
OPHTHALMIC | Status: DC | PRN
  Administered 2021-02-25: 0.2 mL via OPHTHALMIC

## 2021-02-25 MED ORDER — LIDOCAINE HCL (PF) 2 % IJ SOLN
INTRAOCULAR | Status: DC | PRN
  Administered 2021-02-25: 1 mL

## 2021-02-25 MED ORDER — ARMC OPHTHALMIC DILATING DROPS
1.0000 "application " | OPHTHALMIC | Status: DC | PRN
  Administered 2021-02-25 (×3): 1 via OPHTHALMIC

## 2021-02-25 MED ORDER — EPINEPHRINE PF 1 MG/ML IJ SOLN
INTRAOCULAR | Status: DC | PRN
  Administered 2021-02-25: 71 mL via OPHTHALMIC

## 2021-02-25 MED ORDER — BRIMONIDINE TARTRATE-TIMOLOL 0.2-0.5 % OP SOLN
OPHTHALMIC | Status: DC | PRN
  Administered 2021-02-25: 1 [drp] via OPHTHALMIC

## 2021-02-25 MED ORDER — LACTATED RINGERS IV SOLN: INTRAVENOUS | Status: DC

## 2021-02-25 MED ORDER — MIDAZOLAM HCL 2 MG/2ML IJ SOLN
INTRAMUSCULAR | Status: DC | PRN
  Administered 2021-02-25: 1 mg via INTRAVENOUS
  Administered 2021-02-25: .5 mg via INTRAVENOUS

## 2021-02-25 SURGICAL SUPPLY — 15 items
CANNULA ANT/CHMB 27GA (MISCELLANEOUS) ×4 IMPLANT
GLOVE SURG TRIUMPH 8.0 PF LTX (GLOVE) ×2 IMPLANT
GOWN STRL REUS W/ TWL LRG LVL3 (GOWN DISPOSABLE) ×2 IMPLANT
GOWN STRL REUS W/TWL LRG LVL3 (GOWN DISPOSABLE) ×4
LENS IOL TECNIS EYHANCE 20.5 (Intraocular Lens) ×2 IMPLANT
MARKER SKIN DUAL TIP RULER LAB (MISCELLANEOUS) ×2 IMPLANT
NEEDLE FILTER BLUNT 18X 1/2SAF (NEEDLE) ×1
NEEDLE FILTER BLUNT 18X1 1/2 (NEEDLE) ×1 IMPLANT
PACK EYE AFTER SURG (MISCELLANEOUS) ×2 IMPLANT
PACK OPTHALMIC (MISCELLANEOUS) ×2 IMPLANT
PACK PORFILIO (MISCELLANEOUS) ×2 IMPLANT
SYR 3ML LL SCALE MARK (SYRINGE) ×2 IMPLANT
SYR TB 1ML LUER SLIP (SYRINGE) ×2 IMPLANT
WATER STERILE IRR 250ML POUR (IV SOLUTION) ×2 IMPLANT
WIPE NON LINTING 3.25X3.25 (MISCELLANEOUS) ×2 IMPLANT

## 2021-02-25 NOTE — Transfer of Care (Signed)
Immediate Anesthesia Transfer of Care Note  Patient: Kara Dixon  Procedure(s) Performed: CATARACT EXTRACTION PHACO AND INTRAOCULAR LENS PLACEMENT (IOC) LEFT (Left Eye)  Patient Location: PACU  Anesthesia Type: MAC  Level of Consciousness: awake, alert  and patient cooperative  Airway and Oxygen Therapy: Patient Spontanous Breathing and Patient connected to supplemental oxygen  Post-op Assessment: Post-op Vital signs reviewed, Patient's Cardiovascular Status Stable, Respiratory Function Stable, Patent Airway and No signs of Nausea or vomiting  Post-op Vital Signs: Reviewed and stable  Complications: No complications documented.

## 2021-02-25 NOTE — Anesthesia Postprocedure Evaluation (Signed)
Anesthesia Post Note  Patient: Kara Dixon  Procedure(s) Performed: CATARACT EXTRACTION PHACO AND INTRAOCULAR LENS PLACEMENT (IOC) LEFT (Left Eye)     Patient location during evaluation: PACU Anesthesia Type: MAC Level of consciousness: awake and alert, oriented and patient cooperative Pain management: pain level controlled Vital Signs Assessment: post-procedure vital signs reviewed and stable Respiratory status: spontaneous breathing, nonlabored ventilation and respiratory function stable Cardiovascular status: blood pressure returned to baseline and stable Postop Assessment: adequate PO intake Anesthetic complications: no   No complications documented.  Darrin Nipper

## 2021-02-25 NOTE — Op Note (Signed)
PREOPERATIVE DIAGNOSIS:  Nuclear sclerotic cataract of the left eye.   POSTOPERATIVE DIAGNOSIS:  Nuclear sclerotic cataract of the left eye.   OPERATIVE PROCEDURE:@   SURGEON:  Birder Robson, MD.   ANESTHESIA:  Anesthesiologist: Darrin Nipper, MD CRNA: Cameron Ali, CRNA  1.      Managed anesthesia care. 2.     0.31ml of Shugarcaine was instilled following the paracentesis   COMPLICATIONS:  None.   TECHNIQUE:   Stop and chop   DESCRIPTION OF PROCEDURE:  The patient was examined and consented in the preoperative holding area where the aforementioned topical anesthesia was applied to the left eye and then brought back to the Operating Room where the left eye was prepped and draped in the usual sterile ophthalmic fashion and a lid speculum was placed. A paracentesis was created with the side port blade and the anterior chamber was filled with viscoelastic. A near clear corneal incision was performed with the steel keratome. A continuous curvilinear capsulorrhexis was performed with a cystotome followed by the capsulorrhexis forceps. Hydrodissection and hydrodelineation were carried out with BSS on a blunt cannula. The lens was removed in a stop and chop  technique and the remaining cortical material was removed with the irrigation-aspiration handpiece. The capsular bag was inflated with viscoelastic and the Technis [id number] lens was placed in the capsular bag without complication. The remaining viscoelastic was removed from the eye with the irrigation-aspiration handpiece. The wounds were hydrated. The anterior chamber was flushed with BSS and the eye was inflated to physiologic pressure. 0.9ml Vigamox was placed in the anterior chamber. The wounds were found to be water tight. The eye was dressed with Combigan. The patient was given protective glasses to wear throughout the day and a shield with which to sleep tonight. The patient was also given drops with which to begin a drop regimen today and  will follow-up with me in one day. Implant Name Type Inv. Item Serial No. Manufacturer Lot No. LRB No. Used Action  LENS IOL TECNIS EYHANCE 20.5 - [id number] Intraocular Lens LENS IOL TECNIS EYHANCE 20.5 [id number] JOHNSON   Left 1 Implanted    Procedure(s) with comments: CATARACT EXTRACTION PHACO AND INTRAOCULAR LENS PLACEMENT (IOC) LEFT (Left) - 18.25 01:23.4  Electronically signed: Birder Robson 02/25/2021 8:54 AM

## 2021-02-25 NOTE — H&P (Signed)
[facility]   Primary Care Physician:  Einar Pheasant, MD Ophthalmologist: Dr. George Ina  Pre-Procedure History & Physical: HPI:  Kara Dixon is a 85 y.o. female here for cataract surgery.   Past Medical History:  Diagnosis Date  . Cancer ([place])    skin  . Hypercholesterolemia   . Hypertension   . Nephrolithiasis   . Osteopenia   . Vitamin D deficiency     Past Surgical History:  Procedure Laterality Date  . CATARACT EXTRACTION W/PHACO Right 02/11/2021   Procedure: CATARACT EXTRACTION PHACO AND INTRAOCULAR LENS PLACEMENT ([facility]) RIGHT;  Surgeon: Birder Robson, MD;  Location: [facility];  Service: Ophthalmology;  Laterality: Right;  CDE15.19 01:16.1 minutes  . cyst removed under tongue  age 55  . EYE SURGERY  02/2017   EYE LID; care everywhere    Prior to Admission medications   Medication Sig Start Date End Date Taking? Authorizing Provider  amLODipine (NORVASC) 5 MG tablet TAKE 1 TABLET BY MOUTH TWICE A DAY 01/30/21  Yes Einar Pheasant, MD  aspirin 81 MG tablet Take 81 mg by mouth daily.   Yes [provider]  atenolol (TENORMIN) 50 MG tablet TAKE 2 TABLETS BY MOUTH EVERY DAY 02/10/21  Yes Einar Pheasant, MD  fexofenadine (ALLEGRA) 180 MG tablet Take 180 mg by mouth as needed.    Yes [provider]  lactase (LACTAID) 3000 UNITS tablet Take 1 tablet by mouth as needed.   Yes [provider]  LORazepam (ATIVAN) 0.5 MG tablet TAKE 1/2 TAB BY MOUTH AT BEDTIME AS NEEDED 12/25/20  Yes Scott, Randell Patient, MD  PREMARIN 0.3 MG tablet TAKE 1 TABLET (0.3 MG TOTAL) BY MOUTH DAILY. 05/02/20  Yes Einar Pheasant, MD  progesterone (PROMETRIUM) 100 MG capsule TAKE 1 CAPSULE BY MOUTH EVERY DAY 07/19/19  Yes Einar Pheasant, MD  rosuvastatin (CRESTOR) 10 MG tablet TAKE 1 TABLET (10 MG TOTAL) BY MOUTH DAILY. PLEASE CALL FOR APPT FOR FURTHER REFILLS 02/10/21  Yes Einar Pheasant, MD    Allergies as of 01/16/2021 - Review Complete 11/24/2020  Allergen  Reaction Noted  . Avelox [moxifloxacin hcl in nacl]  10/16/2012  . Mucinex [guaifenesin er] Other (See Comments) 10/16/2012  . Penicillins  10/16/2012  . Sulfa antibiotics  10/16/2012    Family History  Problem Relation Age of Onset  . Stroke Mother   . Hypertension Mother   . Fibromyalgia Sister   . Breast cancer Neg Hx   . Colon cancer Neg Hx     Social History   Socioeconomic History  . Marital status: Married    Spouse name: Not on file  . Number of children: 1  . Years of education: Not on file  . Highest education level: Not on file  Occupational History  . Not on file  Tobacco Use  . Smoking status: Never Smoker  . Smokeless tobacco: Never Used  Vaping Use  . Vaping Use: Never used  Substance and Sexual Activity  . Alcohol use: No    Alcohol/week: 0.0 standard drinks  . Drug use: No  . Sexual activity: Not Currently  Other Topics Concern  . Not on file  Social History Narrative  . Not on file   Social Determinants of Health   Financial Resource Strain: Not on file  Food Insecurity: Not on file  Transportation Needs: Not on file  Physical Activity: Not on file  Stress: Not on file  Social Connections: Not on file  Intimate Partner Violence: Not on file  Review of Systems: See HPI, otherwise negative ROS  Physical Exam: BP (!) 142/60   Pulse (!) 53   Temp (!) 97.1 F (36.2 C) (Temporal)   Ht 5' (1.524 m)   Wt 62.1 kg   SpO2 99%   BMI 26.74 kg/m  General:   Alert,  pleasant and cooperative in NAD Head:  Normocephalic and atraumatic. Respiratory:  Normal work of breathing. Cardiovascular:  RRR  Impression/Plan: Kara Dixon is here for cataract surgery.  Risks, benefits, limitations, and alternatives regarding cataract surgery have been reviewed with the patient.  Questions have been answered.  All parties agreeable.   Birder Robson, MD  02/25/2021, 8:25 AM

## 2021-02-25 NOTE — Anesthesia Procedure Notes (Signed)
Procedure Name: MAC Date/Time: 02/25/2021 8:31 AM Performed by: Cameron Ali, CRNA Pre-anesthesia Checklist: Patient identified, Emergency Drugs available, Suction available, Timeout performed and Patient being monitored Patient Re-evaluated:Patient Re-evaluated prior to induction Oxygen Delivery Method: Nasal cannula Placement Confirmation: positive ETCO2

## 2021-02-26 ENCOUNTER — Encounter: Payer: Self-pay | Admitting: Ophthalmology

## 2021-02-27 ENCOUNTER — Other Ambulatory Visit: Payer: Self-pay | Admitting: Internal Medicine

## 2021-03-06 ENCOUNTER — Other Ambulatory Visit: Payer: Self-pay | Admitting: Internal Medicine

## 2021-03-06 DIAGNOSIS — E785 Hyperlipidemia, unspecified: Secondary | ICD-10-CM

## 2021-03-13 ENCOUNTER — Other Ambulatory Visit: Payer: Self-pay | Admitting: Internal Medicine

## 2021-03-26 ENCOUNTER — Telehealth: Payer: Self-pay | Admitting: Internal Medicine

## 2021-03-26 NOTE — Telephone Encounter (Signed)
Attempted to leave message for patient to call back and schedule Medicare Annual Wellness Visit (AWV) in office, but voice mail is full.   If not able to come in office, please offer to do virtually or by telephone.   Last AWV:09/23/2018  Please schedule at anytime with Nurse Health Advisor.

## 2021-04-03 ENCOUNTER — Encounter: Payer: Self-pay | Admitting: Internal Medicine

## 2021-04-03 ENCOUNTER — Other Ambulatory Visit: Payer: Self-pay

## 2021-04-03 ENCOUNTER — Ambulatory Visit (INDEPENDENT_AMBULATORY_CARE_PROVIDER_SITE_OTHER): Payer: Medicare Other | Admitting: Internal Medicine

## 2021-04-03 VITALS — BP 126/68 | HR 52 | Temp 97.9°F | Ht 60.0 in | Wt 133.0 lb

## 2021-04-03 DIAGNOSIS — N1831 Chronic kidney disease, stage 3a: Secondary | ICD-10-CM

## 2021-04-03 DIAGNOSIS — E7800 Pure hypercholesterolemia, unspecified: Secondary | ICD-10-CM | POA: Diagnosis not present

## 2021-04-03 DIAGNOSIS — K5900 Constipation, unspecified: Secondary | ICD-10-CM

## 2021-04-03 DIAGNOSIS — R739 Hyperglycemia, unspecified: Secondary | ICD-10-CM

## 2021-04-03 DIAGNOSIS — L853 Xerosis cutis: Secondary | ICD-10-CM | POA: Diagnosis not present

## 2021-04-03 DIAGNOSIS — I10 Essential (primary) hypertension: Secondary | ICD-10-CM | POA: Diagnosis not present

## 2021-04-03 DIAGNOSIS — Z79899 Other long term (current) drug therapy: Secondary | ICD-10-CM

## 2021-04-03 DIAGNOSIS — E1165 Type 2 diabetes mellitus with hyperglycemia: Secondary | ICD-10-CM | POA: Diagnosis not present

## 2021-04-03 DIAGNOSIS — F439 Reaction to severe stress, unspecified: Secondary | ICD-10-CM

## 2021-04-03 LAB — CBC WITH DIFFERENTIAL/PLATELET
Basophils Absolute: 0.1 10*3/uL (ref 0.0–0.1)
Basophils Relative: 0.9 % (ref 0.0–3.0)
Eosinophils Absolute: 0.3 10*3/uL (ref 0.0–0.7)
Eosinophils Relative: 3.1 % (ref 0.0–5.0)
HCT: 37.7 % (ref 36.0–46.0)
Hemoglobin: 13 g/dL (ref 12.0–15.0)
Lymphocytes Relative: 34.1 % (ref 12.0–46.0)
Lymphs Abs: 3.3 10*3/uL (ref 0.7–4.0)
MCHC: 34.4 g/dL (ref 30.0–36.0)
MCV: 92.1 fl (ref 78.0–100.0)
Monocytes Absolute: 0.9 10*3/uL (ref 0.1–1.0)
Monocytes Relative: 9.2 % (ref 3.0–12.0)
Neutro Abs: 5 10*3/uL (ref 1.4–7.7)
Neutrophils Relative %: 52.7 % (ref 43.0–77.0)
Platelets: 255 10*3/uL (ref 150.0–400.0)
RBC: 4.09 Mil/uL (ref 3.87–5.11)
RDW: 12.7 % (ref 11.5–15.5)
WBC: 9.5 10*3/uL (ref 4.0–10.5)

## 2021-04-03 LAB — HEPATIC FUNCTION PANEL
ALT: 10 U/L (ref 0–35)
AST: 16 U/L (ref 0–37)
Albumin: 4.3 g/dL (ref 3.5–5.2)
Alkaline Phosphatase: 46 U/L (ref 39–117)
Bilirubin, Direct: 0.2 mg/dL (ref 0.0–0.3)
Total Bilirubin: 0.8 mg/dL (ref 0.2–1.2)
Total Protein: 7.3 g/dL (ref 6.0–8.3)

## 2021-04-03 LAB — LIPID PANEL
Cholesterol: 104 mg/dL (ref 0–200)
HDL: 43.2 mg/dL (ref >39.00)
LDL Cholesterol: 37 mg/dL (ref 0–99)
NonHDL: 60.73
Total CHOL/HDL Ratio: 2
Triglycerides: 118 mg/dL (ref 0.0–149.0)
VLDL: 23.6 mg/dL (ref 0.0–40.0)

## 2021-04-03 LAB — BASIC METABOLIC PANEL
BUN: 11 mg/dL (ref 6–23)
CO2: 24 mEq/L (ref 19–32)
Calcium: 9.6 mg/dL (ref 8.4–10.5)
Chloride: 106 mEq/L (ref 96–112)
Creatinine, Ser: 1.04 mg/dL (ref 0.40–1.20)
GFR: 49.91 mL/min — ABNORMAL LOW (ref >60.00)
Glucose, Bld: 113 mg/dL — ABNORMAL HIGH (ref 70–99)
Potassium: 4.3 mEq/L (ref 3.5–5.1)
Sodium: 139 mEq/L (ref 135–145)

## 2021-04-03 LAB — HEMOGLOBIN A1C: Hgb A1c MFr Bld: 6.4 % (ref 4.6–6.5)

## 2021-04-03 LAB — TSH: TSH: 2.68 u[IU]/mL (ref 0.35–5.50)

## 2021-04-03 NOTE — Progress Notes (Signed)
Patient ID: Kara Dixon, female   DOB: 07-10-1936, 85 y.o.   MRN: [id number]   Subjective:    Patient ID: Kara Dixon, female    DOB: [birth year], 85 y.o.   MRN: [id number]  HPI This visit occurred during the SARS-CoV-2 public health emergency.  Safety protocols were in place, including screening questions prior to the visit, additional usage of staff PPE, and extensive cleaning of exam room while observing appropriate contact time as indicated for disinfecting solutions.   Patient here for a scheduled follow up. She reports she is doing relatively well.  Increased stress.  Discussed.   She does not feel needs any further intervention.  Tries to say active.  No chest pain or sob with increased activity or exertion.  No acid reflux reported.  No abdominal pain.  Some constipation.  Also reports she feels she is lactose intolerant.  Discussed miralax/fiber.  Does report dry skin.  Persistent.  Discussed dermatology referral.   Past Medical History:  Diagnosis Date   Cancer ([place])    skin   Hypercholesterolemia    Hypertension    Nephrolithiasis    Osteopenia    Vitamin D deficiency    Past Surgical History:  Procedure Laterality Date   CATARACT EXTRACTION W/PHACO Right 02/11/2021   Procedure: CATARACT EXTRACTION PHACO AND INTRAOCULAR LENS PLACEMENT ([facility]) RIGHT;  Surgeon: Birder Robson, MD;  Location: [facility];  Service: Ophthalmology;  Laterality: Right;  CDE15.19 01:16.1 minutes   CATARACT EXTRACTION W/PHACO Left 02/25/2021   Procedure: CATARACT EXTRACTION PHACO AND INTRAOCULAR LENS PLACEMENT ([facility]) LEFT;  Surgeon: Birder Robson, MD;  Location: [facility];  Service: Ophthalmology;  Laterality: Left;  18.25 01:23.4   cyst removed under tongue  age 29   EYE SURGERY  02/2017   EYE LID; care everywhere   Family History  Problem Relation Age of Onset   Stroke Mother    Hypertension Mother    Fibromyalgia Sister    Breast cancer Neg Hx    Colon cancer Neg Hx     Social History   Socioeconomic History   Marital status: Married    Spouse name: Not on file   Number of children: 1   Years of education: Not on file   Highest education level: Not on file  Occupational History   Not on file  Tobacco Use   Smoking status: Never   Smokeless tobacco: Never  Vaping Use   Vaping Use: Never used  Substance and Sexual Activity   Alcohol use: No    Alcohol/week: 0.0 standard drinks   Drug use: No   Sexual activity: Not Currently  Other Topics Concern   Not on file  Social History Narrative   Not on file   Social Determinants of Health   Financial Resource Strain: Not on file  Food Insecurity: Not on file  Transportation Needs: Not on file  Physical Activity: Not on file  Stress: Not on file  Social Connections: Not on file    Review of Systems  Constitutional:  Negative for appetite change and unexpected weight change.  HENT:  Negative for congestion and sinus pressure.   Respiratory:  Negative for cough, chest tightness and shortness of breath.   Cardiovascular:  Negative for chest pain, palpitations and leg swelling.  Gastrointestinal:  Positive for constipation. Negative for abdominal pain, nausea and vomiting.  Genitourinary:  Negative for difficulty urinating and dysuria.  Musculoskeletal:  Negative for joint swelling and myalgias.  Skin:  Negative for color change  and rash.  Neurological:  Negative for dizziness, light-headedness and headaches.  Psychiatric/Behavioral:  Negative for agitation and dysphoric mood.       Objective:    Physical Exam Vitals reviewed.  Constitutional:      General: She is not in acute distress.    Appearance: Normal appearance.  HENT:     Head: Normocephalic and atraumatic.     Right Ear: External ear normal.     Left Ear: External ear normal.  Eyes:     General: No scleral icterus.       Right eye: No discharge.        Left eye: No discharge.     Conjunctiva/sclera: Conjunctivae normal.   Neck:     Thyroid: No thyromegaly.  Cardiovascular:     Rate and Rhythm: Normal rate and regular rhythm.  Pulmonary:     Effort: No respiratory distress.     Breath sounds: Normal breath sounds. No wheezing.  Abdominal:     General: Bowel sounds are normal.     Palpations: Abdomen is soft.     Tenderness: There is no abdominal tenderness.  Musculoskeletal:        General: No swelling or tenderness.     Cervical back: Neck supple. No tenderness.  Lymphadenopathy:     Cervical: No cervical adenopathy.  Skin:    Findings: No erythema or rash.  Neurological:     Mental Status: She is alert.  Psychiatric:        Mood and Affect: Mood normal.        Behavior: Behavior normal.    BP 126/68   Pulse (!) 52   Temp 97.9 F (36.6 C)   Ht 5' (1.524 m)   Wt 133 lb (60.3 kg)   SpO2 97%   BMI 25.97 kg/m  Wt Readings from Last 3 Encounters:  04/03/21 133 lb (60.3 kg)  02/25/21 136 lb 14.4 oz (62.1 kg)  02/11/21 137 lb 14.4 oz (62.6 kg)    Outpatient Encounter Medications as of 04/03/2021  Medication Sig   amLODipine (NORVASC) 5 MG tablet TAKE 1 TABLET BY MOUTH TWICE A DAY   aspirin 81 MG tablet Take 81 mg by mouth daily.   atenolol (TENORMIN) 50 MG tablet TAKE 2 TABLETS BY MOUTH EVERY DAY   fexofenadine (ALLEGRA) 180 MG tablet Take 180 mg by mouth as needed.    lactase (LACTAID) 3000 UNITS tablet Take 1 tablet by mouth as needed.   LORazepam (ATIVAN) 0.5 MG tablet TAKE 1/2 TAB BY MOUTH AT BEDTIME AS NEEDED   PREMARIN 0.3 MG tablet TAKE 1 TABLET (0.3 MG TOTAL) BY MOUTH DAILY.   progesterone (PROMETRIUM) 100 MG capsule TAKE 1 CAPSULE BY MOUTH EVERY DAY   rosuvastatin (CRESTOR) 10 MG tablet TAKE 1 TABLET (10 MG TOTAL) BY MOUTH DAILY. PLEASE CALL FOR APPT FOR FURTHER REFILLS   No facility-administered encounter medications on file as of 04/03/2021.     Lab Results  Component Value Date   WBC 9.5 04/03/2021   HGB 13.0 04/03/2021   HCT 37.7 04/03/2021   PLT 255.0 04/03/2021    GLUCOSE 113 (H) 04/03/2021   CHOL 104 04/03/2021   TRIG 118.0 04/03/2021   HDL 43.20 04/03/2021   LDLDIRECT 139.4 05/12/2013   LDLCALC 37 04/03/2021   ALT 10 04/03/2021   AST 16 04/03/2021   NA 139 04/03/2021   K 4.3 04/03/2021   CL 106 04/03/2021   CREATININE 1.04 04/03/2021   BUN 11 04/03/2021  CO2 24 04/03/2021   TSH 2.68 04/03/2021   HGBA1C 6.4 04/03/2021   MICROALBUR 4.3 (H) 11/21/2020    No results found.     Assessment & Plan:   Problem List Items Addressed This Visit     CKD (chronic kidney disease) stage 3, GFR 30-59 ml/min (HCC)    Avoid antiinflammatories.  Stay hydrated.  Follow metabolic panel.        Constipation    Some constipation issues as outlined.  Fiber.  Miralax as directed.  Follow.  Notify me if persistent.         Current use of estrogen therapy    She prefers to remain on oral estrogen.  Have tried to taper off previously.  Understands risks and possible side effects of estrogen therapy.  Follow.  Only taking a couple of times per week.         Dry skin    Discussed eucerin cream.  Persistent.  Discussed referral to dermatology.         Relevant Orders   Ambulatory referral to Dermatology   Hypercholesterolemia    On crestor.  Low cholesterol diet and exercise.  Follow lipid panel and liver function tests.   Lab Results  Component Value Date   CHOL 104 04/03/2021   HDL 43.20 04/03/2021   LDLCALC 37 04/03/2021   LDLDIRECT 139.4 05/12/2013   TRIG 118.0 04/03/2021   CHOLHDL 2 04/03/2021        Relevant Orders   Lipid panel (Completed)   Hepatic function panel (Completed)   TSH (Completed)   Hyperglycemia   Relevant Orders   Hemoglobin A1c (Completed)   Hypertension - Primary    Blood pressure doing well.  Continue atenolol and amlodipine.  Follow pressures. Follow metabolic panel.        Relevant Orders   CBC with Differential/Platelet (Completed)   Basic metabolic panel (Completed)   Stress    Increased stress as  outlined.  Discussed.  Reports rarely using lorazepam.  Discussed further treatment and intervention.  She does not feel needs any this time.  Follow.        Type 2 diabetes mellitus with hyperglycemia (HCC)    Low carb diet and exercise given elevated blood sugar. Follow met b and a1c.           Einar Pheasant, MD

## 2021-04-05 ENCOUNTER — Other Ambulatory Visit: Payer: Self-pay | Admitting: Internal Medicine

## 2021-04-06 ENCOUNTER — Other Ambulatory Visit: Payer: Self-pay | Admitting: Internal Medicine

## 2021-04-06 DIAGNOSIS — E785 Hyperlipidemia, unspecified: Secondary | ICD-10-CM

## 2021-04-08 ENCOUNTER — Encounter: Payer: Self-pay | Admitting: Internal Medicine

## 2021-04-08 DIAGNOSIS — K5900 Constipation, unspecified: Secondary | ICD-10-CM | POA: Insufficient documentation

## 2021-04-08 DIAGNOSIS — L853 Xerosis cutis: Secondary | ICD-10-CM | POA: Insufficient documentation

## 2021-04-08 DIAGNOSIS — N183 Chronic kidney disease, stage 3 unspecified: Secondary | ICD-10-CM | POA: Insufficient documentation

## 2021-04-08 NOTE — Assessment & Plan Note (Addendum)
Low carb diet and exercise given elevated blood sugar. Follow met b and a1c.

## 2021-04-08 NOTE — Assessment & Plan Note (Signed)
Some constipation issues as outlined.  Fiber.  Miralax as directed.  Follow.  Notify me if persistent.

## 2021-04-08 NOTE — Assessment & Plan Note (Signed)
Increased stress as outlined.  Discussed.  Reports rarely using lorazepam.  Discussed further treatment and intervention.  She does not feel needs any this time.  Follow.

## 2021-04-08 NOTE — Assessment & Plan Note (Signed)
Discussed eucerin cream.  Persistent.  Discussed referral to dermatology.

## 2021-04-08 NOTE — Assessment & Plan Note (Signed)
Blood pressure doing well.  Continue atenolol and amlodipine.  Follow pressures. Follow metabolic panel.

## 2021-04-08 NOTE — Assessment & Plan Note (Signed)
Avoid antiinflammatories.  Stay hydrated.  Follow metabolic panel.   

## 2021-04-08 NOTE — Assessment & Plan Note (Signed)
She prefers to remain on oral estrogen.  Have tried to taper off previously.  Understands risks and possible side effects of estrogen therapy.  Follow.  Only taking a couple of times per week.

## 2021-04-08 NOTE — Assessment & Plan Note (Signed)
On crestor.  Low cholesterol diet and exercise.  Follow lipid panel and liver function tests.   Lab Results  Component Value Date   CHOL 104 04/03/2021   HDL 43.20 04/03/2021   LDLCALC 37 04/03/2021   LDLDIRECT 139.4 05/12/2013   TRIG 118.0 04/03/2021   CHOLHDL 2 04/03/2021

## 2021-04-10 DIAGNOSIS — Z961 Presence of intraocular lens: Secondary | ICD-10-CM | POA: Diagnosis not present

## 2021-05-08 ENCOUNTER — Other Ambulatory Visit: Payer: Self-pay | Admitting: Internal Medicine

## 2021-05-08 DIAGNOSIS — E785 Hyperlipidemia, unspecified: Secondary | ICD-10-CM

## 2021-05-12 ENCOUNTER — Ambulatory Visit (INDEPENDENT_AMBULATORY_CARE_PROVIDER_SITE_OTHER): Payer: Medicare Other

## 2021-05-12 ENCOUNTER — Other Ambulatory Visit: Payer: Self-pay

## 2021-05-12 VITALS — BP 114/71 | HR 60 | Temp 97.1°F | Resp 15 | Ht 61.0 in | Wt 134.2 lb

## 2021-05-12 DIAGNOSIS — Z0000 Encounter for general adult medical examination without abnormal findings: Secondary | ICD-10-CM | POA: Diagnosis not present

## 2021-05-12 NOTE — Patient Instructions (Addendum)
Kara Dixon , Thank you for taking time to come for your Medicare Wellness Visit. I appreciate your ongoing commitment to your health goals. Please review the following plan we discussed and let me know if I can assist you in the future.   These are the goals we discussed:  Goals       Patient Stated     Increase physical activity (pt-stated)      Walk more for exercise        This is a list of the screening recommended for you and due dates:  Health Maintenance  Topic Date Due   Flu Shot  06/28/2021*   COVID-19 Vaccine (4 - Booster for Moderna series) 10/03/2021*   Zoster (Shingles) Vaccine (1 of 2) 10/03/2021*   Tetanus Vaccine  04/03/2022*   Hemoglobin A1C  10/03/2021   Complete foot exam   11/21/2021   Urine Protein Check  11/21/2021   Mammogram  12/17/2021   Eye exam for diabetics  02/11/2022   DEXA scan (bone density measurement)  Completed   Pneumonia vaccines  Completed   HPV Vaccine  Aged Out  *Topic was postponed. The date shown is not the original due date.    Advanced directives: not yet completed  Conditions/risks identified: none new  Follow up in one year for your annual wellness visit    Preventive Care 65 Years and Older, Female Preventive care refers to lifestyle choices and visits with your health care provider that can promote health and wellness. What does preventive care include? A yearly physical exam. This is also called an annual well check. Dental exams once or twice a year. Routine eye exams. Ask your health care provider how often you should have your eyes checked. Personal lifestyle choices, including: Daily care of your teeth and gums. Regular physical activity. Eating a healthy diet. Avoiding tobacco and drug use. Limiting alcohol use. Practicing safe sex. Taking low-dose aspirin every day. Taking vitamin and mineral supplements as recommended by your health care provider. What happens during an annual well check? The services and  screenings done by your health care provider during your annual well check will depend on your age, overall health, lifestyle risk factors, and family history of disease. Counseling  Your health care provider may ask you questions about your: Alcohol use. Tobacco use. Drug use. Emotional well-being. Home and relationship well-being. Sexual activity. Eating habits. History of falls. Memory and ability to understand (cognition). Work and work Statistician. Reproductive health. Screening  You may have the following tests or measurements: Height, weight, and BMI. Blood pressure. Lipid and cholesterol levels. These may be checked every 5 years, or more frequently if you are over 62 years old. Skin check. Lung cancer screening. You may have this screening every year starting at age 46 if you have a 30-pack-year history of smoking and currently smoke or have quit within the past 15 years. Fecal occult blood test (FOBT) of the stool. You may have this test every year starting at age 16. Flexible sigmoidoscopy or colonoscopy. You may have a sigmoidoscopy every 5 years or a colonoscopy every 10 years starting at age 67. Hepatitis C blood test. Hepatitis B blood test. Sexually transmitted disease (STD) testing. Diabetes screening. This is done by checking your blood sugar (glucose) after you have not eaten for a while (fasting). You may have this done every 1-3 years. Bone density scan. This is done to screen for osteoporosis. You may have this done starting at age 76. Mammogram.  This may be done every 1-2 years. Talk to your health care provider about how often you should have regular mammograms. Talk with your health care provider about your test results, treatment options, and if necessary, the need for more tests. Vaccines  Your health care provider may recommend certain vaccines, such as: Influenza vaccine. This is recommended every year. Tetanus, diphtheria, and acellular pertussis (Tdap,  Td) vaccine. You may need a Td booster every 10 years. Zoster vaccine. You may need this after age 35. Pneumococcal 13-valent conjugate (PCV13) vaccine. One dose is recommended after age 69. Pneumococcal polysaccharide (PPSV23) vaccine. One dose is recommended after age 93. Talk to your health care provider about which screenings and vaccines you need and how often you need them. This information is not intended to replace advice given to you by your health care provider. Make sure you discuss any questions you have with your health care provider. Document Released: 10/18/2015 Document Revised: 06/10/2016 Document Reviewed: 07/23/2015 Elsevier Interactive Patient Education  2017 [place] Prevention in the Home Falls can cause injuries. They can happen to people of all ages. There are many things you can do to make your home safe and to help prevent falls. What can I do on the outside of my home? Regularly fix the edges of walkways and driveways and fix any cracks. Remove anything that might make you trip as you walk through a door, such as a raised step or threshold. Trim any bushes or trees on the path to your home. Use bright outdoor lighting. Clear any walking paths of anything that might make someone trip, such as rocks or tools. Regularly check to see if handrails are loose or broken. Make sure that both sides of any steps have handrails. Any raised decks and porches should have guardrails on the edges. Have any leaves, snow, or ice cleared regularly. Use sand or salt on walking paths during winter. Clean up any spills in your garage right away. This includes oil or grease spills. What can I do in the bathroom? Use night lights. Install grab bars by the toilet and in the tub and shower. Do not use towel bars as grab bars. Use non-skid mats or decals in the tub or shower. If you need to sit down in the shower, use a plastic, non-slip stool. Keep the floor dry. Clean up any  water that spills on the floor as soon as it happens. Remove soap buildup in the tub or shower regularly. Attach bath mats securely with double-sided non-slip rug tape. Do not have throw rugs and other things on the floor that can make you trip. What can I do in the bedroom? Use night lights. Make sure that you have a light by your bed that is easy to reach. Do not use any sheets or blankets that are too big for your bed. They should not hang down onto the floor. Have a firm chair that has side arms. You can use this for support while you get dressed. Do not have throw rugs and other things on the floor that can make you trip. What can I do in the kitchen? Clean up any spills right away. Avoid walking on wet floors. Keep items that you use a lot in easy-to-reach places. If you need to reach something above you, use a strong step stool that has a grab bar. Keep electrical cords out of the way. Do not use floor polish or wax that makes floors slippery. If you  must use wax, use non-skid floor wax. Do not have throw rugs and other things on the floor that can make you trip. What can I do with my stairs? Do not leave any items on the stairs. Make sure that there are handrails on both sides of the stairs and use them. Fix handrails that are broken or loose. Make sure that handrails are as long as the stairways. Check any carpeting to make sure that it is firmly attached to the stairs. Fix any carpet that is loose or worn. Avoid having throw rugs at the top or bottom of the stairs. If you do have throw rugs, attach them to the floor with carpet tape. Make sure that you have a light switch at the top of the stairs and the bottom of the stairs. If you do not have them, ask someone to add them for you. What else can I do to help prevent falls? Wear shoes that: Do not have high heels. Have rubber bottoms. Are comfortable and fit you well. Are closed at the toe. Do not wear sandals. If you use a  stepladder: Make sure that it is fully opened. Do not climb a closed stepladder. Make sure that both sides of the stepladder are locked into place. Ask someone to hold it for you, if possible. Clearly mark and make sure that you can see: Any grab bars or handrails. First and last steps. Where the edge of each step is. Use tools that help you move around (mobility aids) if they are needed. These include: Canes. Walkers. Scooters. Crutches. Turn on the lights when you go into a dark area. Replace any light bulbs as soon as they burn out. Set up your furniture so you have a clear path. Avoid moving your furniture around. If any of your floors are uneven, fix them. If there are any pets around you, be aware of where they are. Review your medicines with your doctor. Some medicines can make you feel dizzy. This can increase your chance of falling. Ask your doctor what other things that you can do to help prevent falls. This information is not intended to replace advice given to you by your health care provider. Make sure you discuss any questions you have with your health care provider. Document Released: 07/18/2009 Document Revised: 02/27/2016 Document Reviewed: 10/26/2014 Elsevier Interactive Patient Education  2017 [employer].

## 2021-05-12 NOTE — Progress Notes (Signed)
Subjective:   Kara Dixon is a 85 y.o. female who presents for Medicare Annual (Subsequent) preventive examination.  Review of Systems    No ROS.  Medicare Wellness Virtual Visit.  Visual/audio telehealth visit, UTA vital signs.   See social history for additional risk factors.   Cardiac Risk Factors include: advanced age (>45mn, >>53women)     Objective:    Today's Vitals   05/12/21 1345  BP: 114/71  Pulse: 60  Resp: 15  Temp: (!) 97.1 F (36.2 C)  Weight: 134 lb 3.2 oz (60.9 kg)  Height: '5\' 1"'$  (1.549 m)   Body mass index is 25.36 kg/m.  Advanced Directives 05/12/2021 02/25/2021 02/11/2021 09/23/2018 09/23/2017 09/22/2016  Does Patient Have a Medical Advance Directive? No No No No No No  Would patient like information on creating a medical advance directive? No - Patient declined No - Patient declined Yes (MAU/Ambulatory/Procedural Areas - Information given) No - Patient declined Yes (MAU/Ambulatory/Procedural Areas - Information given) No - Patient declined    Current Medications (verified) Outpatient Encounter Medications as of 05/12/2021  Medication Sig   amLODipine (NORVASC) 5 MG tablet TAKE 1 TABLET BY MOUTH TWICE A DAY   aspirin 81 MG tablet Take 81 mg by mouth daily.   atenolol (TENORMIN) 50 MG tablet TAKE 2 TABLETS BY MOUTH EVERY DAY   fexofenadine (ALLEGRA) 180 MG tablet Take 180 mg by mouth as needed.    lactase (LACTAID) 3000 UNITS tablet Take 1 tablet by mouth as needed.   LORazepam (ATIVAN) 0.5 MG tablet TAKE 1/2 TAB BY MOUTH AT BEDTIME AS NEEDED   PREMARIN 0.3 MG tablet TAKE 1 TABLET (0.3 MG TOTAL) BY MOUTH DAILY.   progesterone (PROMETRIUM) 100 MG capsule TAKE 1 CAPSULE BY MOUTH EVERY DAY   rosuvastatin (CRESTOR) 10 MG tablet TAKE 1 TABLET (10 MG TOTAL) BY MOUTH DAILY. PLEASE CALL FOR APPT FOR FURTHER REFILLS   No facility-administered encounter medications on file as of 05/12/2021.    Allergies (verified) Avelox [moxifloxacin hcl in nacl], Mucinex  [guaifenesin er], Penicillins, Sulfa antibiotics, and Maxitrol [neomycin-polymyxin-dexameth]   History: Past Medical History:  Diagnosis Date   Cancer ([place]    skin   Hypercholesterolemia    Hypertension    Nephrolithiasis    Osteopenia    Vitamin D deficiency    Past Surgical History:  Procedure Laterality Date   CATARACT EXTRACTION W/PHACO Right 02/11/2021   Procedure: CATARACT EXTRACTION PHACO AND [place]TRAOCULAR LENS PLACEMENT [place];  Surgeon: PBirder Robson MD;  [facility]  Service: Ophthalmology;  Laterality: Right;  CDE15.19 01:16.1 minutes   CATARACT EXTRACTION W/PHACO Left 02/25/2021   Procedure: CATARACT EXTRACTION PHACO AND INTRAOCULAR LENS PLACEMENT ([facility]) LEFT;  Surgeon: PBirder Robson MD;  [facility]  Service: Ophthalmology;  Laterality: Left;  18.25 01:23.4   cyst removed under tongue  age 85  EYE SURGERY  02/2017   EYE LID; care everywhere   Family History  Problem Relation Age of Onset   Stroke Mother    Hypertension Mother    Fibromyalgia Sister    Breast cancer Neg Hx    Colon cancer Neg Hx    Social History   Socioeconomic History   Marital status: Married    Spouse name: Not on file   Number of children: 1   Years of education: Not on file   Highest education level: Not on file  Occupational History   Not on file  Tobacco Use   Smoking  status: Never   Smokeless tobacco: Never  Vaping Use   Vaping Use: Never used  Substance and Sexual Activity   Alcohol use: No    Alcohol/week: 0.0 standard drinks   Drug use: No   Sexual activity: Not Currently  Other Topics Concern   Not on file  Social History Narrative   Not on file   Social Determinants of Health   Financial Resource Strain: Low Risk    Difficulty of Paying Living Expenses: Not hard at all  Food Insecurity: No Food Insecurity   Worried About Charity fundraiser in the Last Year: Never true   [place] in the Last Year: Never  true  Transportation Needs: No Transportation Needs   Lack of Transportation (Medical): No   Lack of Transportation (Non-Medical): No  Physical Activity: Unknown   Days of Exercise per Week: 0 days   Minutes of Exercise per Session: Not on file  Stress: No Stress Concern Present   Feeling of Stress : Not at all  Social Connections: Unknown   Frequency of Communication with Friends and Family: More than three times a week   Frequency of Social Gatherings with Friends and Family: Not on file   Attends Religious Services: Not on Electrical engineer or Organizations: Not on file   Attends Archivist Meetings: Not on file   Marital Status: Married    Tobacco Counseling Counseling given: Not Answered   Clinical Intake:  Pre-visit preparation completed: Yes        Diabetes: Yes  How often do you need to have someone help you when you read instructions, pamphlets, or other written materials from your doctor or pharmacy?: 1 - Never  Nutrition Risk Assessment:  Has the patient had any N/V/D within the last 2 months?  No  Does the patient have any non-healing wounds?  No  Has the patient had any unintentional weight loss or weight gain?  No   Financial Strains and Diabetes Management:  Are you having any financial strains with the device, your supplies or your medication? No .  Does the patient want to be seen by Chronic Care Management for management of their diabetes?  No  Would the patient like to be referred to a Nutritionist or for Diabetic Management?  No       Activities of Daily Living In your present state of health, do you have any difficulty performing the following activities: 05/12/2021 02/25/2021  Hearing? N N  Vision? N N  Difficulty concentrating or making decisions? N N  Walking or climbing stairs? N N  Dressing or bathing? N N  Doing errands, shopping? N -  Preparing Food and eating ? N -  Using the Toilet? N -  In the past six  months, have you accidently leaked urine? N -  Do you have problems with loss of bowel control? N -  Managing your Medications? N -  Managing your Finances? N -  Housekeeping or managing your Housekeeping? N -  Some recent data might be hidden    Patient Care Team: Einar Pheasant, MD as PCP - General (Internal Medicine)  Indicate any recent Medical Services you may have received from other than [facility] providers in the past year (date may be approximate).     Assessment:   This is a routine wellness examination for Monasia.  Hearing/Vision screen Hearing Screening - Comments:: Patient is able to hear conversational tones without difficulty. No  issues reported.  Vision Screening - Comments:: Cataract extraction, bilateral They have seen their ophthalmologist in the last 12 months.   Dietary issues and exercise activities discussed: Current Exercise Habits: Home exercise routine, Intensity: Mild Healthy diet Good fluid intake    Goals Addressed               This Visit's Progress     Patient Stated     Increase physical activity (pt-stated)        Walk more for exercise       Depression Screen PHQ 2/9 Scores 05/12/2021 04/03/2021 08/20/2020 09/23/2018 09/23/2017 09/22/2016 01/22/2016  PHQ - 2 Score 0 0 0 0 0 0 0  PHQ- 9 Score - - - - 0 - -    Fall Risk Fall Risk  05/12/2021 04/03/2021 08/20/2020 09/23/2018 09/23/2017  Falls in the past year? 0 0 0 0 No  Number falls in past yr: - 0 - - -  Injury with Fall? - 0 - - -  Follow up Falls evaluation completed Falls evaluation completed Falls evaluation completed - -    FALL RISK PREVENTION PERTAINING TO THE HOME: Adequate lighting in your home to reduce risk of falls? Yes   ASSISTIVE DEVICES UTILIZED TO PREVENT FALLS: Life alert? No  Use of a cane, walker or w/c? No  Grab bars in the bathroom? Yes  Shower chair or bench in shower? No  Elevated toilet seat or a handicapped toilet? No   TIMED UP AND GO: Was the test  performed? Yes .  Length of time to ambulate 10 feet: 12 sec.   Gait slow and steady without use of assistive device  Cognitive Function:     6CIT Screen 05/12/2021 09/23/2018 09/23/2017 09/22/2016  What Year? 0 points 0 points 0 points 0 points  What month? 0 points 0 points 0 points 0 points  What time? 0 points 0 points 0 points 0 points  Count back from 20 0 points 0 points 0 points 0 points  Months in reverse 0 points 0 points 0 points 0 points  Repeat phrase - 0 points 0 points -  Total Score - 0 0 -    Immunizations Immunization History  Administered Date(s) Administered   Fluad Quad(high Dose 65+) 09/11/2019, 08/20/2020   Influenza Split 07/10/2013   Influenza, High Dose Seasonal PF 10/30/2016, 09/10/2017, 09/06/2018   Influenza,inj,Quad PF,6+ Mos 09/10/2014, 09/18/2015   Influenza-Unspecified 07/25/2012, 07/11/2013   Moderna Sars-Covid-2 Vaccination 04/25/2020, 05/23/2020, 11/27/2020   Pneumococcal Conjugate-13 03/09/2017   Pneumococcal Polysaccharide-23 05/18/2018    Health Maintenance  There are no preventive care reminders to display for this patient. Health Maintenance  Topic Date Due   INFLUENZA VACCINE  06/28/2021 (Originally 05/05/2021)   COVID-19 Vaccine (4 - Booster for Moderna series) 10/03/2021 (Originally 02/24/2021)   Zoster Vaccines- Shingrix (1 of 2) 10/03/2021 (Originally 04/30/1955)   TETANUS/TDAP  04/03/2022 (Originally 04/30/1955)   HEMOGLOBIN A1C  10/03/2021   FOOT EXAM  11/21/2021   URINE MICROALBUMIN  11/21/2021   MAMMOGRAM  12/17/2021   OPHTHALMOLOGY EXAM  02/11/2022   DEXA SCAN  Completed   PNA vac Low Risk Adult  Completed   HPV VACCINES  Aged Out   Lung Cancer Screening: (Low Dose CT Chest recommended if Age 87-80 years, 30 pack-year currently smoking OR have quit w/in 15years.) does not qualify.   Hepatitis C Screening: does not qualify  Vision Screening: Recommended annual ophthalmology exams for early detection of glaucoma and other  disorders of  the eye.  Dental Screening: Recommended annual dental exams for proper oral hygiene.  Community Resource Referral / Chronic Care Management: CRR required this visit?  No   CCM required this visit?  No      Plan:   Keep all routine maintenance appointments.   I have personally reviewed and noted the following in the patient's chart:   Medical and social history Use of alcohol, tobacco or illicit drugs  Current medications and supplements including opioid prescriptions. Patient is not currently taking opioid.  Functional ability and status Nutritional status Physical activity Advanced directives List of other physicians Hospitalizations, surgeries, and ER visits in previous 12 months Vitals Screenings to include cognitive, depression, and falls Referrals and appointments  In addition, I have reviewed and discussed with patient certain preventive protocols, quality metrics, and best practice recommendations. A written personalized care plan for preventive services as well as general preventive health recommendations were provided to patient.     Varney Biles, LPN   [phone]

## 2021-05-19 ENCOUNTER — Other Ambulatory Visit: Payer: Self-pay | Admitting: Internal Medicine

## 2021-06-12 ENCOUNTER — Other Ambulatory Visit: Payer: Self-pay | Admitting: Internal Medicine

## 2021-06-12 DIAGNOSIS — E785 Hyperlipidemia, unspecified: Secondary | ICD-10-CM

## 2021-06-19 ENCOUNTER — Telehealth: Payer: Self-pay | Admitting: Internal Medicine

## 2021-06-19 ENCOUNTER — Telehealth: Payer: Self-pay

## 2021-06-19 ENCOUNTER — Ambulatory Visit (INDEPENDENT_AMBULATORY_CARE_PROVIDER_SITE_OTHER): Payer: Medicare Other | Admitting: Internal Medicine

## 2021-06-19 ENCOUNTER — Other Ambulatory Visit: Payer: Self-pay

## 2021-06-19 DIAGNOSIS — E1165 Type 2 diabetes mellitus with hyperglycemia: Secondary | ICD-10-CM

## 2021-06-19 DIAGNOSIS — F439 Reaction to severe stress, unspecified: Secondary | ICD-10-CM

## 2021-06-19 DIAGNOSIS — E7800 Pure hypercholesterolemia, unspecified: Secondary | ICD-10-CM | POA: Diagnosis not present

## 2021-06-19 DIAGNOSIS — L989 Disorder of the skin and subcutaneous tissue, unspecified: Secondary | ICD-10-CM | POA: Diagnosis not present

## 2021-06-19 DIAGNOSIS — Z79899 Other long term (current) drug therapy: Secondary | ICD-10-CM

## 2021-06-19 DIAGNOSIS — N1831 Chronic kidney disease, stage 3a: Secondary | ICD-10-CM

## 2021-06-19 DIAGNOSIS — I10 Essential (primary) hypertension: Secondary | ICD-10-CM | POA: Diagnosis not present

## 2021-06-19 MED ORDER — MUPIROCIN 2 % EX OINT: 1.0000 "application " | TOPICAL_OINTMENT | Freq: Two times a day (BID) | CUTANEOUS | 0 refills | Status: DC

## 2021-06-19 NOTE — Progress Notes (Signed)
Patient ID: Kara Dixon, female   DOB: 05/08/1936, 85 y.o.   MRN: [id number]   Subjective:    Patient ID: Kara Dixon, female    DOB: 01/27/1936, 85 y.o.   MRN: [id number]  This visit occurred during the SARS-CoV-2 public health emergency.  Safety protocols were in place, including screening questions prior to the visit, additional usage of staff PPE, and extensive cleaning of exam room while observing appropriate contact time as indicated for disinfecting solutions.   Patient here for a scheduled follow up.   Chief Complaint  Patient presents with   Hypertension   Diabetes   .   HPI Here to follow up regarding her blood sugar, blood pressure and cholesterol.  She is doing relatively well.  Trying to stay active.  No chest pain or sob reported.  No abdominal pain.  Bowels moving now.  Did not need miralax.  Has adjusted her diet.  Trying to avoid dairy.  Bowels are better.  Lesion - right wrist.  Persistent for 2 years.  "Picked" at it recently.     Past Medical History:  Diagnosis Date   Cancer (HCC)    skin   Hypercholesterolemia    Hypertension    Nephrolithiasis    Osteopenia    Vitamin D deficiency    Past Surgical History:  Procedure Laterality Date   CATARACT EXTRACTION W/PHACO Right 02/11/2021   Procedure: CATARACT EXTRACTION PHACO AND INTRAOCULAR LENS PLACEMENT (IOC) RIGHT;  Surgeon: Porfilio, William, MD;  Location: [facility];  Service: Ophthalmology;  Laterality: Right;  CDE15.19 01:16.1 minutes   CATARACT EXTRACTION W/PHACO Left 02/25/2021   Procedure: CATARACT EXTRACTION PHACO AND INTRAOCULAR LENS PLACEMENT (IOC) LEFT;  Surgeon: Porfilio, William, MD;  Location: [facility];  Service: Ophthalmology;  Laterality: Left;  18.25 01:23.4   cyst removed under tongue  age 11   EYE SURGERY  02/2017   EYE LID; care everywhere   Family History  Problem Relation Age of Onset   Stroke Mother    Hypertension Mother    Fibromyalgia Sister    Breast cancer  Neg Hx    Colon cancer Neg Hx    Social History   Socioeconomic History   Marital status: Married    Spouse name: Not on file   Number of children: 1   Years of education: Not on file   Highest education level: Not on file  Occupational History   Not on file  Tobacco Use   Smoking status: Never   Smokeless tobacco: Never  Vaping Use   Vaping Use: Never used  Substance and Sexual Activity   Alcohol use: No    Alcohol/week: 0.0 standard drinks   Drug use: No   Sexual activity: Not Currently  Other Topics Concern   Not on file  Social History Narrative   Not on file   Social Determinants of Health   Financial Resource Strain: Low Risk    Difficulty of Paying Living Expenses: Not hard at all  Food Insecurity: No Food Insecurity   Worried About Running Out of Food in the Last Year: Never true   Ran Out of Food in the Last Year: Never true  Transportation Needs: No Transportation Needs   Lack of Transportation (Medical): No   Lack of Transportation (Non-Medical): No  Physical Activity: Unknown   Days of Exercise per Week: 0 days   Minutes of Exercise per Session: Not on file  Stress: No Stress Concern Present   Feeling of Stress :   Not at all  Social Connections: Unknown   Frequency of Communication with Friends and Family: More than three times a week   Frequency of Social Gatherings with Friends and Family: Not on file   Attends Religious Services: Not on file   Active Member of Clubs or Organizations: Not on file   Attends Archivist Meetings: Not on file   Marital Status: Married    Review of Systems  Constitutional:  Negative for appetite change and unexpected weight change.  HENT:  Negative for congestion and sinus pressure.   Respiratory:  Negative for cough, chest tightness and shortness of breath.   Cardiovascular:  Negative for chest pain, palpitations and leg swelling.  Gastrointestinal:  Negative for abdominal pain, diarrhea, nausea and  vomiting.  Genitourinary:  Negative for difficulty urinating and dysuria.  Musculoskeletal:  Negative for joint swelling and myalgias.  Skin:  Negative for color change and rash.  Neurological:  Negative for dizziness, light-headedness and headaches.  Psychiatric/Behavioral:  Negative for agitation and dysphoric mood.       Objective:     BP 128/74   Pulse 60   Temp 97.9 F (36.6 C)   Resp 16   Ht 5' 1" (1.549 m)   Wt 132 lb 9.6 oz (60.1 kg)   SpO2 98%   BMI 25.05 kg/m  Wt Readings from Last 3 Encounters:  06/19/21 132 lb 9.6 oz (60.1 kg)  05/12/21 134 lb 3.2 oz (60.9 kg)  04/03/21 133 lb (60.3 kg)    Physical Exam Vitals reviewed.  Constitutional:      General: She is not in acute distress.    Appearance: Normal appearance.  HENT:     Head: Normocephalic and atraumatic.     Right Ear: External ear normal.     Left Ear: External ear normal.  Eyes:     General: No scleral icterus.       Right eye: No discharge.        Left eye: No discharge.     Conjunctiva/sclera: Conjunctivae normal.  Neck:     Thyroid: No thyromegaly.  Cardiovascular:     Rate and Rhythm: Normal rate and regular rhythm.  Pulmonary:     Effort: No respiratory distress.     Breath sounds: Normal breath sounds. No wheezing.  Abdominal:     General: Bowel sounds are normal.     Palpations: Abdomen is soft.     Tenderness: There is no abdominal tenderness.  Musculoskeletal:        General: No swelling or tenderness.     Cervical back: Neck supple. No tenderness.  Lymphadenopathy:     Cervical: No cervical adenopathy.  Skin:    Findings: No erythema or rash.  Neurological:     Mental Status: She is alert.  Psychiatric:        Mood and Affect: Mood normal.        Behavior: Behavior normal.     Outpatient Encounter Medications as of 06/19/2021  Medication Sig   mupirocin ointment (BACTROBAN) 2 % Apply 1 application topically 2 (two) times daily.   aspirin 81 MG tablet Take 81 mg by mouth  daily.   atenolol (TENORMIN) 50 MG tablet TAKE 2 TABLETS BY MOUTH EVERY DAY   fexofenadine (ALLEGRA) 180 MG tablet Take 180 mg by mouth as needed.    lactase (LACTAID) 3000 UNITS tablet Take 1 tablet by mouth as needed.   LORazepam (ATIVAN) 0.5 MG tablet TAKE 1/2 TAB BY MOUTH  AT BEDTIME AS NEEDED   PREMARIN 0.3 MG tablet TAKE 1 TABLET (0.3 MG TOTAL) BY MOUTH DAILY.   progesterone (PROMETRIUM) 100 MG capsule TAKE 1 CAPSULE BY MOUTH EVERY DAY   rosuvastatin (CRESTOR) 10 MG tablet TAKE 1 TABLET (10 MG TOTAL) BY MOUTH DAILY. PLEASE CALL FOR APPT FOR FURTHER REFILLS   [DISCONTINUED] amLODipine (NORVASC) 5 MG tablet TAKE 1 TABLET BY MOUTH TWICE A DAY   No facility-administered encounter medications on file as of 06/19/2021.     Lab Results  Component Value Date   WBC 9.5 04/03/2021   HGB 13.0 04/03/2021   HCT 37.7 04/03/2021   PLT 255.0 04/03/2021   GLUCOSE 113 (H) 04/03/2021   CHOL 104 04/03/2021   TRIG 118.0 04/03/2021   HDL 43.20 04/03/2021   LDLDIRECT 139.4 05/12/2013   LDLCALC 37 04/03/2021   ALT 10 04/03/2021   AST 16 04/03/2021   NA 139 04/03/2021   K 4.3 04/03/2021   CL 106 04/03/2021   CREATININE 1.04 04/03/2021   BUN 11 04/03/2021   CO2 24 04/03/2021   TSH 2.68 04/03/2021   HGBA1C 6.4 04/03/2021   MICROALBUR 4.3 (H) 11/21/2020       Assessment & Plan:   Problem List Items Addressed This Visit     CKD (chronic kidney disease) stage 3, GFR 30-59 ml/min (HCC)    Avoid antiinflammatories.  Stay hydrated.  Follow metabolic panel.       Current use of estrogen therapy    She prefers to remain on oral estrogen.  Have tried to taper off previously.  Understands risks and possible side effects of estrogen therapy.  Follow.  Only taking a couple of times per week.        Hypercholesterolemia    On crestor.  Low cholesterol diet and exercise.  Follow lipid panel and liver function tests.   Lab Results  Component Value Date   CHOL 104 04/03/2021   HDL 43.20 04/03/2021    LDLCALC 37 04/03/2021   LDLDIRECT 139.4 05/12/2013   TRIG 118.0 04/03/2021   CHOLHDL 2 04/03/2021       Hypertension    Blood pressure doing well.  Continue atenolol and amlodipine.  Follow pressures. Follow metabolic panel.       Skin lesion    Persistent lesion on wrist.  Request referral to dermatology.       Relevant Orders   Ambulatory referral to Dermatology   Stress    Overall appears to be handling things relatively well. Does not feel needs any further intervention.  Has good support.  Uses lorazepam prn (reports rarely using).        Type 2 diabetes mellitus with hyperglycemia (HCC)    Low carb diet and exercise given elevated blood sugar. Follow met b and a1c.   Lab Results  Component Value Date   HGBA1C 6.4 04/03/2021         Einar Pheasant, MD

## 2021-06-19 NOTE — Telephone Encounter (Signed)
Fasting labs ordered

## 2021-06-19 NOTE — Telephone Encounter (Signed)
Per check out note Patient needed fasting labs in 4-6 weeks.   Scheduled. Orders needing to be placed

## 2021-06-19 NOTE — Telephone Encounter (Signed)
Labs ordered.

## 2021-06-23 ENCOUNTER — Other Ambulatory Visit: Payer: Self-pay | Admitting: Internal Medicine

## 2021-06-28 ENCOUNTER — Encounter: Payer: Self-pay | Admitting: Internal Medicine

## 2021-06-28 NOTE — Assessment & Plan Note (Signed)
Blood pressure doing well.  Continue atenolol and amlodipine.  Follow pressures. Follow metabolic panel.

## 2021-06-28 NOTE — Assessment & Plan Note (Signed)
Persistent lesion on wrist.  Request referral to dermatology.

## 2021-06-28 NOTE — Assessment & Plan Note (Signed)
Low carb diet and exercise given elevated blood sugar. Follow met b and a1c.   Lab Results  Component Value Date   HGBA1C 6.4 04/03/2021

## 2021-06-28 NOTE — Assessment & Plan Note (Signed)
She prefers to remain on oral estrogen.  Have tried to taper off previously.  Understands risks and possible side effects of estrogen therapy.  Follow.  Only taking a couple of times per week.

## 2021-06-28 NOTE — Assessment & Plan Note (Signed)
Overall appears to be handling things relatively well. Does not feel needs any further intervention.  Has good support.  Uses lorazepam prn (reports rarely using).

## 2021-06-28 NOTE — Assessment & Plan Note (Signed)
On crestor.  Low cholesterol diet and exercise.  Follow lipid panel and liver function tests.   Lab Results  Component Value Date   CHOL 104 04/03/2021   HDL 43.20 04/03/2021   LDLCALC 37 04/03/2021   LDLDIRECT 139.4 05/12/2013   TRIG 118.0 04/03/2021   CHOLHDL 2 04/03/2021

## 2021-06-28 NOTE — Assessment & Plan Note (Signed)
Avoid antiinflammatories.  Stay hydrated.  Follow metabolic panel.   

## 2021-07-15 ENCOUNTER — Other Ambulatory Visit: Payer: Self-pay | Admitting: Internal Medicine

## 2021-07-17 ENCOUNTER — Other Ambulatory Visit: Payer: Medicare Other

## 2021-07-29 ENCOUNTER — Other Ambulatory Visit: Payer: Self-pay

## 2021-07-29 ENCOUNTER — Other Ambulatory Visit (INDEPENDENT_AMBULATORY_CARE_PROVIDER_SITE_OTHER): Payer: Medicare Other

## 2021-07-29 DIAGNOSIS — I10 Essential (primary) hypertension: Secondary | ICD-10-CM | POA: Diagnosis not present

## 2021-07-29 DIAGNOSIS — E7800 Pure hypercholesterolemia, unspecified: Secondary | ICD-10-CM

## 2021-07-29 DIAGNOSIS — E1165 Type 2 diabetes mellitus with hyperglycemia: Secondary | ICD-10-CM | POA: Diagnosis not present

## 2021-07-29 LAB — BASIC METABOLIC PANEL
BUN: 10 mg/dL (ref 6–23)
CO2: 27 mEq/L (ref 19–32)
Calcium: 9.7 mg/dL (ref 8.4–10.5)
Chloride: 106 mEq/L (ref 96–112)
Creatinine, Ser: 1.1 mg/dL (ref 0.40–1.20)
GFR: 46.55 mL/min — ABNORMAL LOW (ref >60.00)
Glucose, Bld: 124 mg/dL — ABNORMAL HIGH (ref 70–99)
Potassium: 4.6 mEq/L (ref 3.5–5.1)
Sodium: 139 mEq/L (ref 135–145)

## 2021-07-29 LAB — LIPID PANEL
Cholesterol: 106 mg/dL (ref 0–200)
HDL: 46.9 mg/dL (ref >39.00)
LDL Cholesterol: 41 mg/dL (ref 0–99)
NonHDL: 59.5
Total CHOL/HDL Ratio: 2
Triglycerides: 92 mg/dL (ref 0.0–149.0)
VLDL: 18.4 mg/dL (ref 0.0–40.0)

## 2021-07-29 LAB — HEPATIC FUNCTION PANEL
ALT: 8 U/L (ref 0–35)
AST: 13 U/L (ref 0–37)
Albumin: 4.3 g/dL (ref 3.5–5.2)
Alkaline Phosphatase: 49 U/L (ref 39–117)
Bilirubin, Direct: 0.2 mg/dL (ref 0.0–0.3)
Total Bilirubin: 0.7 mg/dL (ref 0.2–1.2)
Total Protein: 7.3 g/dL (ref 6.0–8.3)

## 2021-07-29 LAB — HEMOGLOBIN A1C: Hgb A1c MFr Bld: 6.4 % (ref 4.6–6.5)

## 2021-08-24 ENCOUNTER — Other Ambulatory Visit: Payer: Self-pay | Admitting: Internal Medicine

## 2021-09-03 ENCOUNTER — Other Ambulatory Visit: Payer: Self-pay

## 2021-09-03 ENCOUNTER — Ambulatory Visit: Payer: Medicare Other | Admitting: Dermatology

## 2021-09-03 DIAGNOSIS — L578 Other skin changes due to chronic exposure to nonionizing radiation: Secondary | ICD-10-CM

## 2021-09-03 DIAGNOSIS — L570 Actinic keratosis: Secondary | ICD-10-CM

## 2021-09-03 DIAGNOSIS — L820 Inflamed seborrheic keratosis: Secondary | ICD-10-CM

## 2021-09-03 DIAGNOSIS — L821 Other seborrheic keratosis: Secondary | ICD-10-CM | POA: Diagnosis not present

## 2021-09-03 NOTE — Progress Notes (Signed)
   New Patient Visit  Subjective  Kara Dixon is a 85 y.o. female who presents for the following: check spot (R wrist, ~63yrs, hx of growing, no symptoms/R forearm, few months).The patient has spots, moles and lesions to be evaluated, some may be new or changing and the patient has concerns that these could be cancer.  New patient referral from Dr. Einar Pheasant  The following portions of the chart were reviewed this encounter and updated as appropriate:   Tobacco  Allergies  Meds  Problems  Med Hx  Surg Hx  Fam Hx     Review of Systems:  No other skin or systemic complaints except as noted in HPI or Assessment and Plan.  Objective  Well appearing patient in no apparent distress; mood and affect are within normal limits.  A focused examination was performed including right hand/arm. Relevant physical exam findings are noted in the Assessment and Plan.  R wrist x 1, Total = 1 Erythematous keratotic or waxy stuck-on papule or plaque.      R volar forearm near the wrist x 1, Nasal bridge x 1 (2) Hyperkeratotic scaly paps        Assessment & Plan   Actinic Damage - chronic, secondary to cumulative UV radiation exposure/sun exposure over time - diffuse scaly erythematous macules with underlying dyspigmentation - Recommend daily broad spectrum sunscreen SPF 30+ to sun-exposed areas, reapply every 2 hours as needed.  - Recommend staying in the shade or wearing long sleeves, sun glasses (UVA+UVB protection) and wide brim hats (4-inch brim around the entire circumference of the hat). - Call for new or changing lesions.  Seborrheic Keratoses - Stuck-on, waxy, tan-brown papules and/or plaques  - Benign-appearing - Discussed benign etiology and prognosis. - Observe - Call for any changes  Inflamed seborrheic keratosis R wrist x 1, Total = 1  Recommend mupirocin oint for wound care from LN2  Destruction of lesion - R wrist x 1, Total = 1 Complexity: simple    Destruction method: cryotherapy   Informed consent: discussed and consent obtained   Timeout:  patient name, date of birth, surgical site, and procedure verified Lesion destroyed using liquid nitrogen: Yes   Region frozen until ice ball extended beyond lesion: Yes   Outcome: patient tolerated procedure well with no complications   Post-procedure details: wound care instructions given    Hypertrophic actinic keratosis (2) R volar forearm near the wrist x 1, Nasal bridge x 1  Recommend mupirocin oint qd for wound care  Destruction of lesion - R volar forearm near the wrist x 1, Nasal bridge x 1 Complexity: simple   Destruction method: cryotherapy   Informed consent: discussed and consent obtained   Timeout:  patient name, date of birth, surgical site, and procedure verified Lesion destroyed using liquid nitrogen: Yes   Region frozen until ice ball extended beyond lesion: Yes   Outcome: patient tolerated procedure well with no complications   Post-procedure details: wound care instructions given    Return in about 2 months (around 11/03/2021) for recheck ISK, Hypertrophic AKs R volar forearm near the wrist, nasal bridge.  I, Othelia Pulling, RMA, am acting as scribe for Sarina Ser, MD . Documentation: I have reviewed the above documentation for accuracy and completeness, and I agree with the above.  Sarina Ser, MD

## 2021-09-03 NOTE — Patient Instructions (Addendum)
If You Need Anything After Your Visit  If you have any questions or concerns for your doctor, please call our main line at [phone] and press option 4 to reach your doctor's medical assistant. If no one answers, please leave a voicemail as directed and we will return your call as soon as possible. Messages left after 4 pm will be answered the following business day.   You may also send us a message via MyChart. We typically respond to MyChart messages within 1-2 business days.  For prescription refills, please ask your pharmacy to contact our office. Our fax number is [phone].  If you have an urgent issue when the clinic is closed that cannot wait until the next business day, you can page your doctor at the number below.    Please note that while we do our best to be available for urgent issues outside of office hours, we are not available 24/7.   If you have an urgent issue and are unable to reach us, you may choose to seek medical care at your doctor's office, retail clinic, urgent care center, or emergency room.  If you have a medical emergency, please immediately call 911 or go to the emergency department.  Pager Numbers  - Dr. Kowalski: [phone]  - Dr. Moye: [phone]  - Dr. Stewart: [phone]  In the event of inclement weather, please call our main line at [phone] for an update on the status of any delays or closures.  Dermatology Medication Tips: Please keep the boxes that topical medications come in in order to help keep track of the instructions about where and how to use these. Pharmacies typically print the medication instructions only on the boxes and not directly on the medication tubes.   If your medication is too expensive, please contact our office at [phone] option 4 or send us a message through MyChart.   We are unable to tell what your co-pay for medications will be in advance as this is different depending on your insurance coverage.  However, we may be able to find a substitute medication at lower cost or fill out paperwork to get insurance to cover a needed medication.   If a prior authorization is required to get your medication covered by your insurance company, please allow us 1-2 business days to complete this process.  Drug prices often vary depending on where the prescription is filled and some pharmacies may offer cheaper prices.  The website www.goodrx.com contains coupons for medications through different pharmacies. The prices here do not account for what the cost may be with help from insurance (it may be cheaper with your insurance), but the website can give you the price if you did not use any insurance.  - You can print the associated coupon and take it with your prescription to the pharmacy.  - You may also stop by our office during regular business hours and pick up a GoodRx coupon card.  - If you need your prescription sent electronically to a different pharmacy, notify our office through [place] MyChart or by phone at [phone] option 4.     Si Usted Necesita Algo Despus de Su Visita  Tambin puede enviarnos un mensaje a travs de MyChart. Por lo general respondemos a los mensajes de MyChart en el transcurso de 1 a 2 das hbiles.  Para renovar recetas, por favor pida a su farmacia que se ponga en contacto con nuestra oficina. Nuestro nmero de fax es el [phone].  Si tiene   un asunto urgente cuando la clnica est cerrada y que no puede esperar hasta el siguiente da hbil, puede llamar/localizar a su doctor(a) al nmero que aparece a continuacin.   Por favor, tenga en cuenta que aunque hacemos todo lo posible para estar disponibles para asuntos urgentes fuera del horario de [place], no estamos disponibles las 24 horas del da, los 7 das de la [place].   Si tiene un problema urgente y no puede comunicarse con nosotros, puede optar por buscar atencin mdica  en el consultorio de su  doctor(a), en una clnica privada, en un centro de atencin urgente o en una sala de emergencias.  Si tiene Engineering geologist, por favor llame inmediatamente al 911 o vaya a la sala de emergencias.  Nmeros de bper  - Dr. Nehemiah Massed: [phone]  - Dra. Moye: [phone]  - Dra. Nicole Kindred: [phone]  En caso de inclemencias del [place], por favor llame a Johnsie Kindred principal al [phone] para una actualizacin sobre el [place] de cualquier retraso o cierre.  Consejos para la medicacin en dermatologa: Por favor, guarde las cajas en las que vienen los medicamentos de uso tpico para ayudarle a seguir las instrucciones sobre dnde y cmo usarlos. Las farmacias generalmente imprimen las instrucciones del medicamento slo en las cajas y no directamente en los tubos del [place].   Si su medicamento es muy caro, por favor, pngase en contacto con Zigmund Daniel llamando al [phone] y presione la opcin 4 o envenos un mensaje a travs de Pharmacist, community.   No podemos decirle cul ser su copago por los medicamentos por adelantado ya que esto es diferente dependiendo de la cobertura de su seguro. Sin embargo, es posible que podamos encontrar un medicamento sustituto a Electrical engineer un formulario para que el seguro cubra el medicamento que se considera necesario.   Si se requiere una autorizacin previa para que su compaa de seguros [place] su medicamento, por favor permtanos de 1 a 2 das hbiles para completar este proceso.  Los precios de los medicamentos varan con frecuencia dependiendo del Environmental consultant de dnde se surte la receta y alguna farmacias pueden ofrecer precios ms baratos.  El sitio web www.goodrx.com tiene cupones para medicamentos de Airline pilot. Los precios aqu no tienen en cuenta lo que podra costar con la ayuda del seguro (puede ser ms barato con su seguro), pero el sitio web puede darle el precio si no utiliz Research scientist (physical sciences).  - Puede imprimir el cupn  correspondiente y llevarlo con su receta a la farmacia.  - Tambin puede pasar por nuestra oficina durante el horario de atencin regular y Charity fundraiser una tarjeta de cupones de GoodRx.  - Si necesita que su receta se enve electrnicamente a una farmacia diferente, informe a nuestra oficina a travs de MyChart de [place] o por telfono llamando al [phone] y presione la opcin 4.  Cryotherapy Aftercare  Wash gently with soap and water everyday.   Apply Vaseline and Band-Aid daily until healed.    Seborrheic Keratosis  What causes seborrheic keratoses? Seborrheic keratoses are harmless, common skin growths that first appear during adult life.  As time goes by, more growths appear.  Some people may develop a large number of them.  Seborrheic keratoses appear on both covered and uncovered body parts.  They are not caused by sunlight.  The tendency to develop seborrheic keratoses can be inherited.  They vary in color from skin-colored to gray, brown, or even black.  They can be either smooth or  have a rough, warty surface.   Seborrheic keratoses are superficial and look as if they were stuck on the skin.  Under the microscope this type of keratosis looks like layers upon layers of skin.  That is why at times the top layer may seem to fall off, but the rest of the growth remains and re-grows.    Treatment Seborrheic keratoses do not need to be treated, but can easily be removed in the office.  Seborrheic keratoses often cause symptoms when they rub on clothing or jewelry.  Lesions can be in the way of shaving.  If they become inflamed, they can cause itching, soreness, or burning.  Removal of a seborrheic keratosis can be accomplished by freezing, burning, or surgery. If any spot bleeds, scabs, or grows rapidly, please return to have it checked, as these can be an indication of a skin cancer.  Actinic Keratosis  What is an actinic keratosis? An actinic keratosis (plural: actinic keratoses) is  growth on the surface of the skin that usually appears as a red, hard, crusty or scaly bump.   What causes actinic keratoses? Repeated prolonged sun exposure causes skin damage, especially in fair-skinned persons. Sun-damaged skin becomes dry and wrinkled and may form rough, scaly spots called actinic keratoses. These rough spots remain on the skin even though the crust or scale on top is picked off.   Why treat actinic keratoses? Actinic keratoses are not skin cancers, but because they may sometimes turn cancerous they are called "pre-cancerous". Not all will turn to skin cancer, and it usually takes several years for this to happen. Because it is much easier to treat an actinic keratosis then it is to remove a skin cancer, actinic keratoses should be treated to prevent future skin cancer.   How are actinic keratoses treated? The most common way of treating actinic keratoses is to freeze them with liquid nitrogen. Freezing causes scabbing and shedding of the sun-damaged skin. Healing after a removal usually takes two weeks, depending on the size and location of the keratosis. Hands and legs heal more slowly than the face. The skin's final appearance is usually excellent. There are several topical medications that can be used to treat actinic keratoses. These medications generally have side effects of redness, crusting, and pain. Some are used for a few days, and some for several months before the actinic keratosis is completely gone. Photodynamic therapy is another alternative to freezing actinic keratoses. This treatment is done in a physician's office. A medication is applied to the area of skin with actinic keratoses, and it is allowed to soak in for one or more hours. A special light is then applied to the skin. Side effects include redness, burning, and peeling.  How can you prevent actinic keratoses? Protection from the sun is the best way to prevent actinic keratoses. The use of proper clothing  and sunscreens can prevent the sun damage that leads to an actinic keratosis.  Unfortunately, some sun damage is permanent. Once sun damage has progressed to the point where actinic keratoses develop, new keratoses may appear even without further sun exposure. However, even in skin that is already heavily sun damaged, good sun protection can help reduce the number of actinic keratoses that will appear.

## 2021-09-09 ENCOUNTER — Telehealth: Payer: Self-pay | Admitting: Internal Medicine

## 2021-09-09 NOTE — Telephone Encounter (Signed)
Pt called in requesting for a new script to be sent to Pt pharmacy for medication (PREMARIN 0.3 MG tablet). Pt is requesting the the script to state for 30 tablets instead of 15 tablet because Pt would like to use coupon for prescription order. Pt requesting callback to confirm that script was sent over.

## 2021-09-10 ENCOUNTER — Encounter: Payer: Self-pay | Admitting: Dermatology

## 2021-09-10 MED ORDER — ESTROGENS CONJUGATED 0.3 MG PO TABS
0.3000 mg | ORAL_TABLET | Freq: Every day | ORAL | 4 refills | Status: DC
Start: 2021-09-10 — End: 2022-02-25

## 2021-09-10 NOTE — Telephone Encounter (Signed)
Last OV 06/19/2021 okay to fill Premarin for 30 day supply?

## 2021-09-10 NOTE — Telephone Encounter (Signed)
Ok

## 2021-09-10 NOTE — Telephone Encounter (Signed)
LMTCB please advise patient premarin called in as requested 30 day supply.

## 2021-09-11 ENCOUNTER — Telehealth: Payer: Self-pay | Admitting: Internal Medicine

## 2021-09-11 MED ORDER — PROGESTERONE MICRONIZED 100 MG PO CAPS: 100.0000 mg | ORAL_CAPSULE | Freq: Every day | ORAL | 0 refills | Status: DC

## 2021-09-11 NOTE — Telephone Encounter (Signed)
Left message to return call to office patient called on 09/08/21 requesting premarin fill for 30 day supply script filled , Now asking progesterone refilled and this medication has not been filled since 2020.

## 2021-09-11 NOTE — Telephone Encounter (Signed)
Pt is requesting refill for progesterone (PROMETRIUM) 100 MG capsule Pt is on her third day without this medication because she has had trouble getting this filled due to needing a new prescription sent in. Pt uses [facility] in [place] [address]

## 2021-09-11 NOTE — Telephone Encounter (Signed)
Rx sent in for progesterone. D/w her at her upcoming appt.

## 2021-09-16 ENCOUNTER — Telehealth: Payer: Self-pay | Admitting: Pharmacist

## 2021-09-16 NOTE — Telephone Encounter (Signed)
This patient is appearing on the insurance-provided list for being at risk of failing the adherence measure for cholesterol medications this calendar year.   Medication: rosuvastatin 10 mg Last fill date: 06/12/21 - script was sent and filled for a 30 day supply with no refills.   Per last PCP note, rosuvastatin 10 mg daily was to be continued. Routing to PCP and LPN to send refill order to the pharmacy.

## 2021-09-16 NOTE — Telephone Encounter (Signed)
Ok to refill and confirm pt taking.

## 2021-09-17 ENCOUNTER — Other Ambulatory Visit: Payer: Self-pay

## 2021-09-17 DIAGNOSIS — E785 Hyperlipidemia, unspecified: Secondary | ICD-10-CM

## 2021-09-17 MED ORDER — ROSUVASTATIN CALCIUM 10 MG PO TABS: 10.0000 mg | ORAL_TABLET | Freq: Every day | ORAL | 1 refills | Status: DC

## 2021-09-17 NOTE — Telephone Encounter (Signed)
The patient is still taking the medication. She is wanting a refill on rosuvastatin (CRESTOR) 10 MG tablet

## 2021-09-17 NOTE — Telephone Encounter (Signed)
Refill sent.

## 2021-09-23 ENCOUNTER — Other Ambulatory Visit: Payer: Self-pay | Admitting: Internal Medicine

## 2021-10-04 ENCOUNTER — Other Ambulatory Visit: Payer: Self-pay | Admitting: Internal Medicine

## 2021-10-08 ENCOUNTER — Ambulatory Visit
Admission: EM | Admit: 2021-10-08 | Discharge: 2021-10-08 | Disposition: A | Discharge status: Home / Self Care | Payer: Medicare Other | Attending: Emergency Medicine | Admitting: Emergency Medicine

## 2021-10-08 ENCOUNTER — Encounter: Payer: Self-pay | Admitting: Emergency Medicine

## 2021-10-08 ENCOUNTER — Other Ambulatory Visit: Payer: Self-pay

## 2021-10-08 DIAGNOSIS — R110 Nausea: Secondary | ICD-10-CM

## 2021-10-08 DIAGNOSIS — R42 Dizziness and giddiness: Secondary | ICD-10-CM | POA: Diagnosis not present

## 2021-10-08 MED ORDER — ONDANSETRON 4 MG PO TBDP: 4.0000 mg | ORAL_TABLET | Freq: Three times a day (TID) | ORAL | 0 refills | Status: DC | PRN

## 2021-10-08 NOTE — ED Provider Notes (Signed)
Kara Dixon    CSN: [id number] Arrival date & time: 10/08/21  1628      History   Chief Complaint Chief Complaint  Patient presents with   Dizziness    HPI Kara Dixon is a 86 y.o. female.  Accompanied by her husband, patient presents with dizziness intermittently since early morning.  She states she woke up during the night to go to the bathroom and felt dizzy.  She describes the dizziness as sensation of the room spinning.  The dizziness has become less frequent this afternoon; it occurs with position changes.  She feels nauseated and has not been able to eat or drink today due to nausea.  No emesis.  She denies chest pain, shortness of breath, focal weakness, headache, fever, chills, or other symptoms.  No treatments at home.  Her medical history includes hypertension and diabetes.  The history is provided by the patient, the spouse and medical records.   Past Medical History:  Diagnosis Date   Cancer ([place])    skin   Hypercholesterolemia    Hypertension    Nephrolithiasis    Osteopenia    Vitamin D deficiency     Patient Active Problem List   Diagnosis Date Noted   Dry skin 04/08/2021   Constipation 04/08/2021   CKD (chronic kidney disease) stage 3, GFR 30-59 ml/min ([place]) 04/08/2021   Stress 08/25/2020   Hyperglycemia 08/20/2020   Tremor 09/13/2017   Pharyngitis 11/22/2015   Current use of estrogen therapy 09/24/2015   Lumbar pain 02/06/2015   Health care maintenance 01/13/2015   Skin lesion 01/21/2014   Diverticulosis 03/16/2013   Type 2 diabetes mellitus with hyperglycemia ([place]) 01/08/2013   Vitamin D deficiency 10/17/2012   Hypertension 10/16/2012   Hypercholesterolemia 10/16/2012   Osteopenia 10/16/2012    Past Surgical History:  Procedure Laterality Date   CATARACT EXTRACTION W/PHACO Right 02/11/2021   Procedure: CATARACT EXTRACTION PHACO AND INTRAOCULAR LENS PLACEMENT ([place]) RIGHT;  Surgeon: Birder Robson, MD;  Location: [facility];  Service: Ophthalmology;  Laterality: Right;  CDE15.19 01:16.1 minutes   CATARACT EXTRACTION W/PHACO Left 02/25/2021   Procedure: CATARACT EXTRACTION PHACO AND INTRAOCULAR LENS PLACEMENT ([facility]) LEFT;  Surgeon: Birder Robson, MD;  Location: [facility];  Service: Ophthalmology;  Laterality: Left;  18.25 01:23.4   cyst removed under tongue  age 81   EYE SURGERY  02/2017   EYE LID; care everywhere    OB History   No obstetric history on file.      Home Medications    Prior to Admission medications   Medication Sig Start Date End Date Taking? Authorizing Provider  ondansetron (ZOFRAN-ODT) 4 MG disintegrating tablet Take 1 tablet (4 mg total) by mouth every 8 (eight) hours as needed for nausea or vomiting. 10/08/21  Yes Sharion Balloon, NP  amLODipine (NORVASC) 5 MG tablet TAKE 1 TABLET BY MOUTH TWICE A DAY 06/23/21   Einar Pheasant, MD  aspirin 81 MG tablet Take 81 mg by mouth daily.    [provider]  atenolol (TENORMIN) 50 MG tablet TAKE 2 TABLETS BY MOUTH EVERY DAY 09/23/21   Einar Pheasant, MD  estrogens, conjugated, (PREMARIN) 0.3 MG tablet Take 1 tablet (0.3 mg total) by mouth daily. Take daily for 21 days then do not take for 7 days. 09/10/21   Einar Pheasant, MD  fexofenadine (ALLEGRA) 180 MG tablet Take 180 mg by mouth as needed.     [provider]  lactase (LACTAID) 3000 UNITS tablet  Take 1 tablet by mouth as needed.    [provider]  LORazepam (ATIVAN) 0.5 MG tablet TAKE 1/2 TAB BY MOUTH AT BEDTIME AS NEEDED 05/21/21   Einar Pheasant, MD  mupirocin ointment (BACTROBAN) 2 % Apply 1 application topically 2 (two) times daily. 06/19/21   Einar Pheasant, MD  progesterone (PROMETRIUM) 100 MG capsule TAKE 1 CAPSULE BY MOUTH EVERY DAY 07/19/19   Einar Pheasant, MD  progesterone (PROMETRIUM) 100 MG capsule TAKE 1 CAPSULE BY MOUTH EVERY DAY 10/07/21   Einar Pheasant, MD  rosuvastatin (CRESTOR) 10 MG tablet Take 1 tablet (10 mg total) by mouth  daily. 09/17/21   Einar Pheasant, MD    Family History Family History  Problem Relation Age of Onset   Stroke Mother    Hypertension Mother    Fibromyalgia Sister    Breast cancer Neg Hx    Colon cancer Neg Hx     Social History Social History   Tobacco Use   Smoking status: Never   Smokeless tobacco: Never  Vaping Use   Vaping Use: Never used  Substance Use Topics   Alcohol use: No    Alcohol/week: 0.0 standard drinks   Drug use: No     Allergies   Avelox [moxifloxacin hcl in nacl], Mucinex [guaifenesin er], Penicillins, Sulfa antibiotics, and Maxitrol [neomycin-polymyxin-dexameth]   Review of Systems Review of Systems  Constitutional:  Negative for chills and fever.  Respiratory:  Negative for cough and shortness of breath.   Cardiovascular:  Negative for chest pain and palpitations.  Gastrointestinal:  Positive for nausea. Negative for abdominal pain, diarrhea and vomiting.  Skin:  Negative for color change and rash.  Neurological:  Positive for dizziness. Negative for seizures, syncope, facial asymmetry, speech difficulty, weakness, numbness and headaches.  All other systems reviewed and are negative.   Physical Exam Triage Vital Signs ED Triage Vitals  Enc Vitals Group     BP      Pulse      Resp      Temp      Temp src      SpO2      Weight      Height      Head Circumference      Peak Flow      Pain Score      Pain Loc      Pain Edu?      Excl. in [place]?    No data found.  Updated Vital Signs BP 122/71 (BP Location: Left Arm)    Pulse 68    Temp 98 F (36.7 C) (Oral)    Resp 18    SpO2 96%   Visual Acuity Right Eye Distance:   Left Eye Distance:   Bilateral Distance:    Right Eye Near:   Left Eye Near:    Bilateral Near:     Physical Exam Vitals and nursing note reviewed.  Constitutional:      General: She is not in acute distress.    Appearance: She is well-developed. She is ill-appearing.  HENT:     Right Ear: Tympanic membrane  normal.     Left Ear: Tympanic membrane normal.     Mouth/Throat:     Mouth: Mucous membranes are dry.     Pharynx: Oropharynx is clear.  Cardiovascular:     Rate and Rhythm: Normal rate and regular rhythm.     Heart sounds: Normal heart sounds.  Pulmonary:     Effort: Pulmonary effort is  normal. No respiratory distress.     Breath sounds: Normal breath sounds.  Abdominal:     Palpations: Abdomen is soft.     Tenderness: There is no abdominal tenderness.  Musculoskeletal:     Cervical back: Neck supple.  Skin:    General: Skin is warm and dry.  Neurological:     General: No focal deficit present.     Mental Status: She is alert and oriented to person, place, and time.     Sensory: No sensory deficit.     Motor: No weakness.     Gait: Gait abnormal.     Comments: Patient holding onto husband's arm to walk.   Psychiatric:        Mood and Affect: Mood normal.        Behavior: Behavior normal.     UC Treatments / Results  Labs (all labs ordered are listed, but only abnormal results are displayed) Labs Reviewed - No data to display  EKG   Radiology No results found.  Procedures Procedures (including critical care time)  Medications Ordered in UC Medications - No data to display  Initial Impression / Assessment and Plan / UC Course  I have reviewed the triage vital signs and the nursing notes.  Pertinent labs & imaging results that were available during my care of the patient were reviewed by me and considered in my medical decision making (see chart for details).   Dizziness, Nausea without vomiting.  Patient is ill-appearing and becomes dizzy with position changes (sitting to standing) here in the office.  EKG shows sinus rhythm, rate 61, no ST elevation, no previous to compare.  I discussed with patient and her husband at length the limitations of evaluation of her symptoms in an urgent care setting.  Discussed that she needs to be seen in the ED.  Patient and her  husband decline transfer to the ED at this time.  Per patient request, treating nausea with Zofran.  Instructed her to hydrate with clear liquids such as water.  Instructed her to follow-up with her PCP tomorrow.  Strict ED precautions discussed.  Patient agrees to plan of care.  Final Clinical Impressions(s) / UC Diagnoses   Final diagnoses:  Dizziness  Nausea without vomiting     Discharge Instructions      Go to the emergency department for evaluation of your dizziness.    Take the antinausea medication as directed.  Keep yourself hydrated with clear liquids, such as water.    Follow up with your primary care provider tomorrow.            ED Prescriptions     Medication Sig Dispense Auth. Provider   ondansetron (ZOFRAN-ODT) 4 MG disintegrating tablet Take 1 tablet (4 mg total) by mouth every 8 (eight) hours as needed for nausea or vomiting. 20 tablet Sharion Balloon, NP      PDMP not reviewed this encounter.   Sharion Balloon, NP 10/08/21 1740

## 2021-10-08 NOTE — ED Triage Notes (Signed)
Pt c/o nausea and dizziness that started this morning. She is unable to walk with out holding on to something.

## 2021-10-08 NOTE — Discharge Instructions (Addendum)
Go to the emergency department for evaluation of your dizziness.    Take the antinausea medication as directed.  Keep yourself hydrated with clear liquids, such as water.    Follow up with your primary care provider tomorrow.

## 2021-10-14 ENCOUNTER — Encounter: Payer: Self-pay | Admitting: Internal Medicine

## 2021-10-15 ENCOUNTER — Ambulatory Visit (INDEPENDENT_AMBULATORY_CARE_PROVIDER_SITE_OTHER): Payer: Medicare Other | Admitting: Family

## 2021-10-15 ENCOUNTER — Other Ambulatory Visit: Payer: Self-pay

## 2021-10-15 ENCOUNTER — Encounter: Payer: Self-pay | Admitting: Family

## 2021-10-15 VITALS — BP 112/68 | HR 62 | Temp 98.1°F | Ht 61.0 in | Wt 126.0 lb

## 2021-10-15 DIAGNOSIS — R42 Dizziness and giddiness: Secondary | ICD-10-CM | POA: Insufficient documentation

## 2021-10-15 LAB — CBC WITH DIFFERENTIAL/PLATELET
Basophils Absolute: 0.1 10*3/uL (ref 0.0–0.1)
Basophils Relative: 0.9 % (ref 0.0–3.0)
Eosinophils Absolute: 0.1 10*3/uL (ref 0.0–0.7)
Eosinophils Relative: 0.5 % (ref 0.0–5.0)
HCT: 39.2 % (ref 36.0–46.0)
Hemoglobin: 12.8 g/dL (ref 12.0–15.0)
Lymphocytes Relative: 22.5 % (ref 12.0–46.0)
Lymphs Abs: 2.5 10*3/uL (ref 0.7–4.0)
MCHC: 32.7 g/dL (ref 30.0–36.0)
MCV: 93.8 fl (ref 78.0–100.0)
Monocytes Absolute: 1.3 10*3/uL — ABNORMAL HIGH (ref 0.1–1.0)
Monocytes Relative: 12.1 % — ABNORMAL HIGH (ref 3.0–12.0)
Neutro Abs: 7.1 10*3/uL (ref 1.4–7.7)
Neutrophils Relative %: 64 % (ref 43.0–77.0)
Platelets: 272 10*3/uL (ref 150.0–400.0)
RBC: 4.18 Mil/uL (ref 3.87–5.11)
RDW: 12.7 % (ref 11.5–15.5)
WBC: 11.1 10*3/uL — ABNORMAL HIGH (ref 4.0–10.5)

## 2021-10-15 LAB — COMPREHENSIVE METABOLIC PANEL
ALT: 8 U/L (ref 0–35)
AST: 13 U/L (ref 0–37)
Albumin: 4.2 g/dL (ref 3.5–5.2)
Alkaline Phosphatase: 49 U/L (ref 39–117)
BUN: 12 mg/dL (ref 6–23)
CO2: 27 mEq/L (ref 19–32)
Calcium: 9.3 mg/dL (ref 8.4–10.5)
Chloride: 103 mEq/L (ref 96–112)
Creatinine, Ser: 0.94 mg/dL (ref 0.40–1.20)
GFR: 56.13 mL/min — ABNORMAL LOW (ref >60.00)
Glucose, Bld: 118 mg/dL — ABNORMAL HIGH (ref 70–99)
Potassium: 3.9 mEq/L (ref 3.5–5.1)
Sodium: 137 mEq/L (ref 135–145)
Total Bilirubin: 1 mg/dL (ref 0.2–1.2)
Total Protein: 7.5 g/dL (ref 6.0–8.3)

## 2021-10-15 MED ORDER — ATENOLOL 50 MG PO TABS: 50.0000 mg | ORAL_TABLET | Freq: Every day | ORAL | 1 refills | Status: DC

## 2021-10-15 MED ORDER — MECLIZINE HCL 12.5 MG PO TABS: 12.5000 mg | ORAL_TABLET | Freq: Three times a day (TID) | ORAL | 0 refills | Status: DC | PRN

## 2021-10-15 NOTE — Assessment & Plan Note (Addendum)
Improved.   Blood pressure demonstrates orthostatic hypotension however symptoms most consistent with BPPV. I reviewed EKG from urgent care dates 10/08/20 which shows NSR without acute ischemia. No prior EKG to compare too.Encouraged staying well hydrated.  Advised to decrease atenolol from 100mg  to 50mg  QD and to continue amlodipine 5mg  bid as previously prescribed. I was initially reluctant to start meclizine as concern for sedation/risk of falls however patient specifically asked for mediation as was adamant that she didn't feel dizzy however vertigo symptom. Counseled her on adverse effects of sedation of meclizine and to use sparingly. Advised consideration for neuroimaging as symptom onset abrupt to rule out CVA. Patient declines and in setting of reassuring neurologic exam, I advised we can continue to monitor symptoms closely. Consider ENT referral if symptoms do not improve. She has follow up with Dr Nicki Reaper 10/23/21.

## 2021-10-15 NOTE — Progress Notes (Signed)
Subjective:    Patient ID: Kara Dixon, female    DOB: Aug 10, 1936, 86 y.o.   MRN: [id number]  CC: Kara Dixon is a 86 y.o. female who presents today for an acute visit.    HPI: HPI Acute visit for room spining, improved.  started 11 days. Started one early morning at 2:30 AM when going to bathroom and when she put feet on floor and sat up, she felt she was on a 'ride.' Resolved after a few seconds.  It has been 'all the time'. She will now feel it now when  getting OOB in the morning and resolves within a few minutes.  Occasional tinnitus in left ear.  Describes as 'spinning' especially when lays back or leans back.  Reports 'bad sinus infection' 2 weeks ago with cough and runny nose, since resolved.  She doesn't feel dizzy  No associated nausea, syncope,, fever, ear pain, sore throat, hearing loss, pulsatile tinnitus, abdominal pain, confusion leg swelling, HA, vision changes , cp.   Denies h/o sinus disease , stroke  She was seen at urgent care 10/08/21 and advised to be seen in emergency room.  She was provided with Zofran which has not been helpful for her.   Hypertension-compliant with amlodipine 5 mg BID, atenolol 100 mg qd   No prior cardiac work up.   History of CKD, skin cancer Never smoker HISTORY:  Past Medical History:  Diagnosis Date   Cancer ([place])    skin   Hypercholesterolemia    Hypertension    Nephrolithiasis    Osteopenia    Vitamin D deficiency    Past Surgical History:  Procedure Laterality Date   CATARACT EXTRACTION W/PHACO Right 02/11/2021   Procedure: CATARACT EXTRACTION PHACO AND INTRAOCULAR LENS PLACEMENT ([place]) RIGHT;  Surgeon: Birder Robson, MD;  Location: [facility];  Service: Ophthalmology;  Laterality: Right;  CDE15.19 01:16.1 minutes   CATARACT EXTRACTION W/PHACO Left 02/25/2021   Procedure: CATARACT EXTRACTION PHACO AND INTRAOCULAR LENS PLACEMENT ([facility]) LEFT;  Surgeon: Birder Robson, MD;  Location: [facility];   Service: Ophthalmology;  Laterality: Left;  18.25 01:23.4   cyst removed under tongue  age 71   EYE SURGERY  02/2017   EYE LID; care everywhere   Family History  Problem Relation Age of Onset   Stroke Mother    Hypertension Mother    Fibromyalgia Sister    Breast cancer Neg Hx    Colon cancer Neg Hx     Allergies: Avelox [moxifloxacin hcl in nacl], Mucinex [guaifenesin er], Penicillins, Sulfa antibiotics, and Maxitrol [neomycin-polymyxin-dexameth] Current Outpatient Medications on File Prior to Visit  Medication Sig Dispense Refill   amLODipine (NORVASC) 5 MG tablet TAKE 1 TABLET BY MOUTH TWICE A DAY 180 tablet 1   aspirin 81 MG tablet Take 81 mg by mouth daily.     estrogens, conjugated, (PREMARIN) 0.3 MG tablet Take 1 tablet (0.3 mg total) by mouth daily. Take daily for 21 days then do not take for 7 days. 30 tablet 4   fexofenadine (ALLEGRA) 180 MG tablet Take 180 mg by mouth as needed.      lactase (LACTAID) 3000 UNITS tablet Take 1 tablet by mouth as needed.     LORazepam (ATIVAN) 0.5 MG tablet TAKE 1/2 TAB BY MOUTH AT BEDTIME AS NEEDED 15 tablet 0   mupirocin ointment (BACTROBAN) 2 % Apply 1 application topically 2 (two) times daily. 22 g 0   ondansetron (ZOFRAN-ODT) 4 MG disintegrating tablet Take 1 tablet (4 mg  total) by mouth every 8 (eight) hours as needed for nausea or vomiting. 20 tablet 0   progesterone (PROMETRIUM) 100 MG capsule TAKE 1 CAPSULE BY MOUTH EVERY DAY 90 capsule 1   progesterone (PROMETRIUM) 100 MG capsule TAKE 1 CAPSULE BY MOUTH EVERY DAY 30 capsule 0   rosuvastatin (CRESTOR) 10 MG tablet Take 1 tablet (10 mg total) by mouth daily. 90 tablet 1   No current facility-administered medications on file prior to visit.    Social History   Tobacco Use   Smoking status: Never   Smokeless tobacco: Never  Vaping Use   Vaping Use: Never used  Substance Use Topics   Alcohol use: No    Alcohol/week: 0.0 standard drinks   Drug use: No    Review of Systems   Constitutional:  Negative for chills and fever.  HENT:  Negative for congestion.   Eyes:  Negative for visual disturbance.  Respiratory:  Negative for cough.   Cardiovascular:  Negative for chest pain and palpitations.  Gastrointestinal:  Negative for abdominal pain, nausea and vomiting.  Neurological:  Negative for dizziness, syncope and headaches.     Objective:    BP 112/68 (BP Location: Left Arm, Patient Position: Sitting, Cuff Size: Normal)    Pulse 62    Temp 98.1 F (36.7 C) (Oral)    Ht 5\' 1"  (1.549 m)    Wt 126 lb (57.2 kg)    SpO2 98%    BMI 23.81 kg/m    Physical Exam Vitals reviewed.  Constitutional:      Appearance: She is well-developed.  HENT:     Head: Normocephalic and atraumatic.     Right Ear: Hearing, tympanic membrane, ear canal and external ear normal. No swelling or tenderness. No middle ear effusion. Tympanic membrane is not erythematous or bulging.     Left Ear: Hearing, tympanic membrane, ear canal and external ear normal. No swelling or tenderness.  No middle ear effusion. Tympanic membrane is not erythematous or bulging.     Nose: No rhinorrhea.     Right Sinus: No maxillary sinus tenderness or frontal sinus tenderness.     Left Sinus: No maxillary sinus tenderness or frontal sinus tenderness.     Mouth/Throat:     Pharynx: Uvula midline. No posterior oropharyngeal erythema or uvula swelling.  Eyes:     General: Lids are normal. Lids are everted, no foreign bodies appreciated.     Conjunctiva/sclera: Conjunctivae normal.     Pupils: Pupils are equal, round, and reactive to light.  Cardiovascular:     Rate and Rhythm: Normal rate and regular rhythm.     Pulses: Normal pulses.     Heart sounds: Normal heart sounds.  Pulmonary:     Effort: Pulmonary effort is normal.     Breath sounds: Normal breath sounds. No wheezing, rhonchi or rales.  Lymphadenopathy:     Head:     Right side of head: No submental, submandibular, tonsillar, preauricular,  posterior auricular or occipital adenopathy.     Left side of head: No submental, submandibular, tonsillar, preauricular, posterior auricular or occipital adenopathy.     Cervical: No cervical adenopathy.     Right cervical: No superficial, deep or posterior cervical adenopathy.    Left cervical: No superficial, deep or posterior cervical adenopathy.  Skin:    General: Skin is warm and dry.  Neurological:     Mental Status: She is alert.     Cranial Nerves: No cranial nerve deficit.  Sensory: No sensory deficit.     Deep Tendon Reflexes:     Reflex Scores:      Bicep reflexes are 2+ on the right side and 2+ on the left side.      Patellar reflexes are 2+ on the right side and 2+ on the left side.    Comments: Grip equal and strong bilateral upper extremities. Gait strong and steady. Able to perform finger-to-nose without difficulty.  Patient felt vertigo when leaning head backwards while sitting in chair. Opted not to put patient on table for [facility] pike maneuver as symptomatic.    Psychiatric:        Speech: Speech normal.        Behavior: Behavior normal.        Thought Content: Thought content normal.       Assessment & Plan:   Problem List Items Addressed This Visit       Other   Vertigo - Primary    Improved.   Blood pressure demonstrates orthostatic hypotension however symptoms most consistent with BPPV. I reviewed EKG from urgent care dates 10/08/20 which shows NSR without acute ischemia. No prior EKG to compare too.Encouraged staying well hydrated.  Advised to decrease atenolol from 100mg  to 50mg  QD and to continue amlodipine 5mg  bid as previously prescribed. I was initially reluctant to start meclizine as concern for sedation/risk of falls however patient specifically asked for mediation as was adamant that she didn't feel dizzy however vertigo symptom. Counseled her on adverse effects of sedation of meclizine and to use sparingly. Advised consideration for neuroimaging as  symptom onset abrupt to rule out CVA. Patient declines and in setting of reassuring neurologic exam, I advised we can continue to monitor symptoms closely. Consider ENT referral if symptoms do not improve. She has follow up with Dr Nicki Reaper 10/23/21.       Relevant Medications   atenolol (TENORMIN) 50 MG tablet   meclizine (ANTIVERT) 12.5 MG tablet   Other Relevant Orders   CBC with Differential/Platelet   Comprehensive metabolic panel      I have changed Dynisha Bossard's atenolol. I am also having her start on meclizine. Additionally, I am having her maintain her fexofenadine, aspirin, lactase, progesterone, LORazepam, mupirocin ointment, amLODipine, estrogens (conjugated), rosuvastatin, progesterone, and ondansetron.   Meds ordered this encounter  Medications   atenolol (TENORMIN) 50 MG tablet    Sig: Take 1 tablet (50 mg total) by mouth daily.    Dispense:  90 tablet    Refill:  1    Order Specific Question:   Supervising Provider    Answer:   Crecencio Mc [[id number]]   meclizine (ANTIVERT) 12.5 MG tablet    Sig: Take 1 tablet (12.5 mg total) by mouth 3 (three) times daily as needed for dizziness.    Dispense:  30 tablet    Refill:  0    Order Specific Question:   Supervising Provider    Answer:   Crecencio Mc [[id number]]    Return precautions given.   Risks, benefits, and alternatives of the medications and treatment plan prescribed today were discussed, and patient expressed understanding.   Education regarding symptom management and diagnosis given to patient on AVS.  Continue to follow with Einar Pheasant, MD for routine health maintenance.   [name] and I agreed with plan.   [name], FNP  I have spent 26 minutes with a patient including precharting, exam, reviewing medical records, and discussion plan of care.

## 2021-10-15 NOTE — Patient Instructions (Addendum)
As we discussed , I suspect that your blood pressure being on the low end is in part yo symptoms.  Please continue to stay very well hydrated.  We call this orthostatic hypotension.    As discussed, I would like for you to stay on amlodipine 5 mg twice a day as prescribed.  We will decrease atenolol from 100 mg a day to 50 mg a day.  I have sent a new prescription of this.  We will certainly need to watch your blood pressure and ensure does not elevate  At this point, I also suspect Benign paroxysmal positional vertigo (BPPV) and have included information from [facility] below.   You may start meclizine ( antivert)  to be used as needed to help with vertigo symptoms.  This medication is very sedating so please be very careful when you take this medication  If dizziness/vertigo persists, a referral to ENT for further evaluation and medication may be appropriate.   If there is no improvement in your symptoms, or if there is any worsening of symptoms, or if you have any additional concerns, please return to this clinic for re-evaluation; or, if we are closed, consider going to the Emergency Room for evaluation.   What is BPPV?  BPPV  is one of the most common causes of vertigo -- the sudden sensation that you're spinning or that the inside of your head is spinning. Benign paroxysmal positional vertigo causes brief episodes of mild to intense dizziness. Benign paroxysmal positional vertigo is usually triggered by specific changes in the position of your head. This might occur when you tip your head up or down, when you lie down, or when you turn over or sit up in bed. Although benign paroxysmal positional vertigo can be a bothersome problem, it's rarely serious except when it increases the chance of falls.  If you experience dizziness associated with benign paroxysmal positional vertigo (BPPV), consider these tips: Be aware of the possibility of losing your balance, which can lead to falling and  serious injury.  Sit down immediately when you feel dizzy.  Use good lighting if you get up at night.  Walk with a cane for stability if you're at risk of falling.  Work closely with your doctor to manage your symptoms effectively. BPPV may recur even after successful therapy. Fortunately, although there's no cure, the condition can be managed with physical therapy and home treatments.   Dizziness [Uncertain Cause] Dizziness is a common symptom sometimes described as "lightheadedness" or feeling like you are going to faint. If it lasts for only a few seconds and is related to changes in position (such as getting up after lying or sitting for a long time), it is usually not a sign of anything serious. Dizziness that lasts for minutes to hours, or comes on for no apparent reason, may be a sign of a more serious problem (such as dehydration, a medicine reaction, disease of the heart or brain). Today's exam did not show an exact cause for your dizzy spell . Sometimes additional tests are required before a cause can be found. Therefore, it is important to follow up with your doctor if your symptoms continue. Home Care: 1) If a dizzy spell occurs and lasts more than a few seconds, lie down until it passes. If you are lying down, then you cannot hurt yourself by falling if you do faint. 2) Do not drive or operate dangerous equipment until the dizzy spells have stopped for at least 48  hours. 3) If dizzy spells occur with sudden standing, this may be a sign of mild dehydration. Drink extra fluids over the next few days. 4) If you recently started a new medicine or if you had the dose of a current medicine increased (especially blood pressure medicine), talk with the prescribing doctor about your symptoms. Dose adjustments may be needed. Follow Up with your doctor for further evaluation within the next seven days, if your symptoms continue. Get Prompt Medical Attention if any of the following occur: --  Worsening of your symptoms -- Fainting, headache or seizure -- Repeated vomiting -- Feeling like you or the room is spinning -- Chest, arm, neck, back or jaw pain -- Palpitations (the sense that your heart is fluttering or beating fast or hard) -- Shortness of breath -- Blood in vomit or stool (black or red color) -- Weakness of an arm or leg or one side of the face -- Difficulty with speech or vision  2000-2015 [employer] [address], [place] [address]. All rights reserved. This information is not intended as a substitute for professional medical care. Always follow your healthcare professional's instructions.

## 2021-10-21 ENCOUNTER — Ambulatory Visit: Payer: Medicare Other | Admitting: Internal Medicine

## 2021-10-23 ENCOUNTER — Encounter: Payer: Self-pay | Admitting: Internal Medicine

## 2021-10-23 ENCOUNTER — Other Ambulatory Visit: Payer: Self-pay

## 2021-10-23 ENCOUNTER — Telehealth: Payer: Self-pay

## 2021-10-23 ENCOUNTER — Ambulatory Visit (INDEPENDENT_AMBULATORY_CARE_PROVIDER_SITE_OTHER): Payer: Medicare Other | Admitting: Internal Medicine

## 2021-10-23 VITALS — BP 130/70 | HR 69 | Temp 97.9°F | Resp 16 | Ht 61.0 in | Wt 125.8 lb

## 2021-10-23 DIAGNOSIS — I10 Essential (primary) hypertension: Secondary | ICD-10-CM | POA: Diagnosis not present

## 2021-10-23 DIAGNOSIS — N1831 Chronic kidney disease, stage 3a: Secondary | ICD-10-CM | POA: Diagnosis not present

## 2021-10-23 DIAGNOSIS — R739 Hyperglycemia, unspecified: Secondary | ICD-10-CM | POA: Diagnosis not present

## 2021-10-23 DIAGNOSIS — E7800 Pure hypercholesterolemia, unspecified: Secondary | ICD-10-CM

## 2021-10-23 DIAGNOSIS — Z23 Encounter for immunization: Secondary | ICD-10-CM | POA: Diagnosis not present

## 2021-10-23 DIAGNOSIS — R42 Dizziness and giddiness: Secondary | ICD-10-CM

## 2021-10-23 DIAGNOSIS — Z79899 Other long term (current) drug therapy: Secondary | ICD-10-CM

## 2021-10-23 DIAGNOSIS — E1165 Type 2 diabetes mellitus with hyperglycemia: Secondary | ICD-10-CM

## 2021-10-23 DIAGNOSIS — D72829 Elevated white blood cell count, unspecified: Secondary | ICD-10-CM | POA: Insufficient documentation

## 2021-10-23 NOTE — Progress Notes (Signed)
Patient ID: Kara Dixon, female   DOB: 12/14/1935, 86 y.o.   MRN: [id number]   Subjective:    Patient ID: Kara Dixon, female    DOB: 10/28/1935, 86 y.o.   MRN: [id number]  This visit occurred during the SARS-CoV-2 public health emergency.  Safety protocols were in place, including screening questions prior to the visit, additional usage of staff PPE, and extensive cleaning of exam room while observing appropriate contact time as indicated for disinfecting solutions.   Patient here for a scheduled follow up.  HPI Follow up scheduled - to follow up blood pressure and cholesterol.  Recently evaluated for dizziness.  Was evaluated at Urgent Care 10/08/21 - after waking up to go to bathroom and felt like room was spinning.  Position changes worsened.  Associated nausea.  In urgent care, EKG - no acute changes.  Prescription given for zofran.  Reevaluated 10/15/21 - Kara Dixon) - diagnosed vertigo - improved.  Was instructed at that time to decrease atenolol and was given rx for antivert.  Comes in today and reports persistent dizziness.  Has overall improved, but still with increased dizziness.  Prior to dizziness, had cold symptoms.  Denies any increased sinus congestion or nasal congestion currently.  Occasional ringing in her hears.  No significant change in her hearing.  No headache.  She is eating.  Discussed importance of staying hydrated.  Appears she decreased her amlodipine and continues on a total of 138m atenolol.  No chest pain.  No increased heart rate or palpitations.  Position changes aggravating symptoms.     Past Medical History:  Diagnosis Date   Cancer ([facility]    skin   Hypercholesterolemia    Hypertension    Nephrolithiasis    Osteopenia    Vitamin D deficiency    Past Surgical History:  Procedure Laterality Date   CATARACT EXTRACTION W/PHACO Right 02/11/2021   Procedure: CATARACT EXTRACTION PHACO AND INTRAOCULAR LENS PLACEMENT ([place] RIGHT;  Surgeon: PBirder Robson  MD;  [facility]  Service: Ophthalmology;  Laterality: Right;  CDE15.19 01:16.1 minutes   CATARACT EXTRACTION W/PHACO Left 02/25/2021   Procedure: CATARACT EXTRACTION PHACO AND INTRAOCULAR LENS PLACEMENT ([facility]) LEFT;  Surgeon: PBirder Robson MD;  [facility]  Service: Ophthalmology;  Laterality: Left;  18.25 01:23.4   cyst removed under tongue  age 86  EYE SURGERY  02/2017   EYE LID; care everywhere   Family History  Problem Relation Age of Onset   Stroke Mother    Hypertension Mother    Fibromyalgia Sister    Breast cancer Neg Hx    Colon cancer Neg Hx    Social History   Socioeconomic History   Marital status: Married    Spouse name: Not on file   Number of children: 1   Years of education: Not on file   Highest education level: Not on file  Occupational History   Not on file  Tobacco Use   Smoking status: Never   Smokeless tobacco: Never  Vaping Use   Vaping Use: Never used  Substance and Sexual Activity   Alcohol use: No    Alcohol/week: 0.0 standard drinks   Drug use: No   Sexual activity: Not Currently  Other Topics Concern   Not on file  Social History Narrative   Not on file   Social Determinants of Health   Financial Resource Strain: Low Risk    Difficulty of Paying Living Expenses: Not hard at all  Food Insecurity:  No Food Insecurity   Worried About Charity fundraiser in the Last Year: Never true   Ran Out of Food in the Last Year: Never true  Transportation Needs: No Transportation Needs   Lack of Transportation (Medical): No   Lack of Transportation (Non-Medical): No  Physical Activity: Unknown   Days of Exercise per Week: 0 days   Minutes of Exercise per Session: Not on file  Stress: No Stress Concern Present   Feeling of Stress : Not at all  Social Connections: Unknown   Frequency of Communication with Friends and Family: More than three times a week   Frequency of Social Gatherings with Friends and  Family: Not on file   Attends Religious Services: Not on file   Active Member of Clubs or Organizations: Not on file   Attends Archivist Meetings: Not on file   Marital Status: Married     Review of Systems  Constitutional:  Negative for fever and unexpected weight change.  HENT:  Negative for congestion and sinus pressure.   Respiratory:  Negative for cough, chest tightness and shortness of breath.   Cardiovascular:  Negative for chest pain, palpitations and leg swelling.  Gastrointestinal:  Positive for nausea. Negative for abdominal pain, diarrhea and vomiting.  Genitourinary:  Negative for difficulty urinating and dysuria.  Musculoskeletal:  Negative for joint swelling and myalgias.  Skin:  Negative for color change and rash.  Neurological:  Positive for dizziness. Negative for headaches.  Psychiatric/Behavioral:  Negative for agitation and dysphoric mood.       Objective:     BP 130/70    Pulse 69    Temp 97.9 F (36.6 C)    Resp 16    Ht 5' 1"  (1.549 m)    Wt 125 lb 12.8 oz (57.1 kg)    SpO2 98%    BMI 23.77 kg/m  Wt Readings from Last 3 Encounters:  10/23/21 125 lb 12.8 oz (57.1 kg)  10/15/21 126 lb (57.2 kg)  06/19/21 132 lb 9.6 oz (60.1 kg)    Physical Exam Vitals reviewed.  Constitutional:      General: She is not in acute distress.    Appearance: Normal appearance.  HENT:     Head: Normocephalic and atraumatic.     Right Ear: External ear normal.     Left Ear: External ear normal.  Eyes:     General: No scleral icterus.       Right eye: No discharge.        Left eye: No discharge.     Conjunctiva/sclera: Conjunctivae normal.  Neck:     Thyroid: No thyromegaly.  Cardiovascular:     Rate and Rhythm: Regular rhythm.  Pulmonary:     Effort: No respiratory distress.     Breath sounds: Normal breath sounds. No wheezing.  Abdominal:     General: Bowel sounds are normal.     Palpations: Abdomen is soft.     Tenderness: There is no abdominal  tenderness.  Musculoskeletal:        General: No swelling or tenderness.     Cervical back: Neck supple. No tenderness.  Lymphadenopathy:     Cervical: No cervical adenopathy.  Skin:    Findings: No erythema or rash.  Neurological:     Mental Status: She is alert.     Comments: Increased dizziness with position changes and movements - reproducible on exam.    Psychiatric:        Mood and  Affect: Mood normal.        Behavior: Behavior normal.     Outpatient Encounter Medications as of 10/23/2021  Medication Sig   amLODipine (NORVASC) 5 MG tablet TAKE 1 TABLET BY MOUTH TWICE A DAY   aspirin 81 MG tablet Take 81 mg by mouth daily.   atenolol (TENORMIN) 50 MG tablet Take 1 tablet (50 mg total) by mouth daily.   estrogens, conjugated, (PREMARIN) 0.3 MG tablet Take 1 tablet (0.3 mg total) by mouth daily. Take daily for 21 days then do not take for 7 days.   fexofenadine (ALLEGRA) 180 MG tablet Take 180 mg by mouth as needed.    lactase (LACTAID) 3000 UNITS tablet Take 1 tablet by mouth as needed.   LORazepam (ATIVAN) 0.5 MG tablet TAKE 1/2 TAB BY MOUTH AT BEDTIME AS NEEDED   mupirocin ointment (BACTROBAN) 2 % Apply 1 application topically 2 (two) times daily.   progesterone (PROMETRIUM) 100 MG capsule TAKE 1 CAPSULE BY MOUTH EVERY DAY   rosuvastatin (CRESTOR) 10 MG tablet Take 1 tablet (10 mg total) by mouth daily.   [DISCONTINUED] progesterone (PROMETRIUM) 100 MG capsule TAKE 1 CAPSULE BY MOUTH EVERY DAY   meclizine (ANTIVERT) 12.5 MG tablet Take 1 tablet (12.5 mg total) by mouth 3 (three) times daily as needed for dizziness. (Patient not taking: Reported on 10/23/2021)   [DISCONTINUED] ondansetron (ZOFRAN-ODT) 4 MG disintegrating tablet Take 1 tablet (4 mg total) by mouth every 8 (eight) hours as needed for nausea or vomiting. (Patient not taking: Reported on 10/23/2021)   No facility-administered encounter medications on file as of 10/23/2021.     Lab Results  Component Value Date    WBC 11.1 (H) 10/15/2021   HGB 12.8 10/15/2021   HCT 39.2 10/15/2021   PLT 272.0 10/15/2021   GLUCOSE 118 (H) 10/15/2021   CHOL 106 07/29/2021   TRIG 92.0 07/29/2021   HDL 46.90 07/29/2021   LDLDIRECT 139.4 05/12/2013   LDLCALC 41 07/29/2021   ALT 8 10/15/2021   AST 13 10/15/2021   NA 137 10/15/2021   K 3.9 10/15/2021   CL 103 10/15/2021   CREATININE 0.94 10/15/2021   BUN 12 10/15/2021   CO2 27 10/15/2021   TSH 2.68 04/03/2021   HGBA1C 6.4 07/29/2021   MICROALBUR 4.3 (H) 11/21/2020       Assessment & Plan:   Problem List Items Addressed This Visit     CKD (chronic kidney disease) stage 3, GFR 30-59 ml/min (HCC)    Avoid antiinflammatories.  Stay hydrated.  Follow metabolic panel.       Current use of estrogen therapy    She prefers to remain on oral estrogen.  Have tried to taper off previously.  Understands risks and possible side effects of estrogen therapy.  Follow.  Only taking a couple of times per week.        Hypercholesterolemia    On crestor.  Low cholesterol diet and exercise.  Follow lipid panel and liver function tests.   Lab Results  Component Value Date   CHOL 106 07/29/2021   HDL 46.90 07/29/2021   LDLCALC 41 07/29/2021   LDLDIRECT 139.4 05/12/2013   TRIG 92.0 07/29/2021   CHOLHDL 2 07/29/2021       Relevant Orders   Lipid panel   Hyperglycemia    low      Hypertension    Blood pressure as outlined.  Medication adjustment recommended.  Will clarify when she returns home current medication regimen.  Follow.  Leukocytosis    Will need f/u cbc.  Wanted to hold on labs today.       Type 2 diabetes mellitus with hyperglycemia (HCC)    Low carb diet and exercise given elevated blood sugar. Follow met b and a1c.   Lab Results  Component Value Date   HGBA1C 6.4 07/29/2021       Relevant Orders   Hemoglobin A1c   Vertigo - Primary    Persistent.  In reviewing started end of December.  Previous "cold" symptoms.  EKG - UC - NSR with no  acute ischemic changes.  Labs unrevealing for etiology.  Instructed to stay hydrated.  It appears she has decreased amlodipine to 34m q day (instead of bid).  Appears to still be taking atenolol 573m(two per day).  Blood pressure slight drop with standing.  Has not taken meclizine or zofran.  Dizziness reproducible on exam.  Increased dizziness with position changes and movements.  Discussed meclizine.  Discussed possible side effects.  ENT for further evaluation and treatment.  Discussed possible CVA and further neuroimaging (including MRI).  She wants to hold on head scan.  Urgent ENT referral placed.  No driving.       Relevant Orders   Ambulatory referral to ENT   Other Visit Diagnoses     Need for immunization against influenza       Relevant Orders   Flu Vaccine QUAD High Dose(Fluad) (Completed)        ChEinar PheasantMD

## 2021-10-23 NOTE — Telephone Encounter (Signed)
I called & was unable to reach patient. I called husband since her VM was full & was able to reach him. Patient has been napping when I called but her husband went to get her medications & said that every day she was taking amlodipine 5 mg & atenolol 50 mg. I asked how patient was & if she seemed okay to him. He thought that she seemed okay & was doing "pretty good".

## 2021-10-23 NOTE — Telephone Encounter (Signed)
Per note, she is on amlodipine 5mg .  Need to know if she is taking one per day or bid.  Also, she is on atenolol 50mg  - need to know if taking 2 per day or one per day.

## 2021-10-24 ENCOUNTER — Encounter: Payer: Self-pay | Admitting: Internal Medicine

## 2021-10-24 DIAGNOSIS — Z961 Presence of intraocular lens: Secondary | ICD-10-CM | POA: Diagnosis not present

## 2021-10-24 NOTE — Telephone Encounter (Signed)
Called both numbers. Unable to LM

## 2021-10-24 NOTE — Assessment & Plan Note (Signed)
low

## 2021-10-24 NOTE — Assessment & Plan Note (Signed)
Blood pressure as outlined.  Medication adjustment recommended.  Will clarify when she returns home current medication regimen.  Follow.

## 2021-10-24 NOTE — Assessment & Plan Note (Signed)
On crestor.  Low cholesterol diet and exercise.  Follow lipid panel and liver function tests.   Lab Results  Component Value Date   CHOL 106 07/29/2021   HDL 46.90 07/29/2021   LDLCALC 41 07/29/2021   LDLDIRECT 139.4 05/12/2013   TRIG 92.0 07/29/2021   CHOLHDL 2 07/29/2021

## 2021-10-24 NOTE — Assessment & Plan Note (Signed)
Avoid antiinflammatories.  Stay hydrated.  Follow metabolic panel.   

## 2021-10-24 NOTE — Assessment & Plan Note (Signed)
Will need f/u cbc.  Wanted to hold on labs today.

## 2021-10-24 NOTE — Assessment & Plan Note (Signed)
She prefers to remain on oral estrogen.  Have tried to taper off previously.  Understands risks and possible side effects of estrogen therapy.  Follow.  Only taking a couple of times per week.

## 2021-10-24 NOTE — Assessment & Plan Note (Signed)
Persistent.  In reviewing started end of December.  Previous "cold" symptoms.  EKG - UC - NSR with no acute ischemic changes.  Labs unrevealing for etiology.  Instructed to stay hydrated.  It appears she has decreased amlodipine to 5mg  q day (instead of bid).  Appears to still be taking atenolol 50mg  (two per day).  Blood pressure slight drop with standing.  Has not taken meclizine or zofran.  Dizziness reproducible on exam.  Increased dizziness with position changes and movements.  Discussed meclizine.  Discussed possible side effects.  ENT for further evaluation and treatment.  Discussed possible CVA and further neuroimaging (including MRI).  She wants to hold on head scan.  Urgent ENT referral placed.  No driving.

## 2021-10-24 NOTE — Assessment & Plan Note (Signed)
Low carb diet and exercise given elevated blood sugar. Follow met b and a1c.   Lab Results  Component Value Date   HGBA1C 6.4 07/29/2021

## 2021-10-28 ENCOUNTER — Other Ambulatory Visit: Payer: Self-pay | Admitting: Internal Medicine

## 2021-10-28 DIAGNOSIS — R42 Dizziness and giddiness: Secondary | ICD-10-CM

## 2021-10-28 NOTE — Telephone Encounter (Signed)
LMTCB

## 2021-10-29 NOTE — Telephone Encounter (Signed)
LMTCB

## 2021-10-30 ENCOUNTER — Other Ambulatory Visit: Payer: Self-pay | Admitting: Internal Medicine

## 2021-10-31 NOTE — Telephone Encounter (Signed)
Rx refilled prometrium #30 with one refill.

## 2021-11-03 ENCOUNTER — Other Ambulatory Visit: Payer: Self-pay | Admitting: Internal Medicine

## 2021-11-05 ENCOUNTER — Ambulatory Visit: Payer: Medicare Other | Admitting: Dermatology

## 2021-11-12 NOTE — Telephone Encounter (Signed)
Left message. Letter mailed.

## 2021-11-14 ENCOUNTER — Telehealth: Payer: Self-pay | Admitting: Internal Medicine

## 2021-11-14 NOTE — Telephone Encounter (Signed)
LMTCB

## 2021-11-14 NOTE — Telephone Encounter (Signed)
Pt called in stating she took a home covid test it says pink on first line and light pink on second line. Pt wants to know if this says she has covid or not because it could be false. Pt want to be called

## 2021-11-15 ENCOUNTER — Ambulatory Visit
Admission: EM | Admit: 2021-11-15 | Discharge: 2021-11-15 | Disposition: A | Discharge status: Home / Self Care | Payer: Medicare Other | Attending: Emergency Medicine | Admitting: Emergency Medicine

## 2021-11-15 ENCOUNTER — Other Ambulatory Visit: Payer: Self-pay

## 2021-11-15 ENCOUNTER — Encounter: Payer: Self-pay | Admitting: Emergency Medicine

## 2021-11-15 DIAGNOSIS — U071 COVID-19: Secondary | ICD-10-CM

## 2021-11-15 NOTE — ED Triage Notes (Signed)
Pt here with positive COVID test with sx of cough, congestion and sneezing x 2 days.

## 2021-11-15 NOTE — ED Provider Notes (Signed)
[name]    CSN: [id number] Arrival date & time: 11/15/21  1114      History   Chief Complaint Chief Complaint  Patient presents with   Cough   Sneezing   Nasal Congestion    HPI Kara Dixon is a 86 y.o. female.  Accompanied by her husband, patient presents with runny nose, congestion, sneezing, cough x2 days. She tested positive for COVID at home yesterday.  No fever, chills, rash, ear pain, sore throat, chest pain, shortness of breath, vomiting, diarrhea, or other symptoms.  Treatment at home with Coricidin.  Her symptoms have improved today.  Her medical history includes hypertension and diabetes.  The history is provided by the patient and medical records.   Past Medical History:  Diagnosis Date   Cancer ([place])    skin   Hypercholesterolemia    Hypertension    Nephrolithiasis    Osteopenia    Vitamin D deficiency     Patient Active Problem List   Diagnosis Date Noted   Leukocytosis 10/23/2021   Vertigo 10/15/2021   Dry skin 04/08/2021   Constipation 04/08/2021   CKD (chronic kidney disease) stage 3, GFR 30-59 ml/min (HCC) 04/08/2021   Stress 08/25/2020   Hyperglycemia 08/20/2020   Tremor 09/13/2017   Pharyngitis 11/22/2015   Current use of estrogen therapy 09/24/2015   Lumbar pain 02/06/2015   Health care maintenance 01/13/2015   Skin lesion 01/21/2014   Diverticulosis 03/16/2013   Type 2 diabetes mellitus with hyperglycemia ([place]) 01/08/2013   Vitamin D deficiency 10/17/2012   Hypertension 10/16/2012   Hypercholesterolemia 10/16/2012   Osteopenia 10/16/2012    Past Surgical History:  Procedure Laterality Date   CATARACT EXTRACTION W/PHACO Right 02/11/2021   Procedure: CATARACT EXTRACTION PHACO AND INTRAOCULAR LENS PLACEMENT ([place]) RIGHT;  Surgeon: Birder Robson, MD;  Location: [facility];  Service: Ophthalmology;  Laterality: Right;  CDE15.19 01:16.1 minutes   CATARACT EXTRACTION W/PHACO Left 02/25/2021   Procedure: CATARACT  EXTRACTION PHACO AND INTRAOCULAR LENS PLACEMENT ([facility]) LEFT;  Surgeon: Birder Robson, MD;  Location: [facility];  Service: Ophthalmology;  Laterality: Left;  18.25 01:23.4   cyst removed under tongue  age 53   EYE SURGERY  02/2017   EYE LID; care everywhere    OB History   No obstetric history on file.      Home Medications    Prior to Admission medications   Medication Sig Start Date End Date Taking? Authorizing Provider  amLODipine (NORVASC) 5 MG tablet TAKE 1 TABLET BY MOUTH TWICE A DAY 06/23/21   Einar Pheasant, MD  aspirin 81 MG tablet Take 81 mg by mouth daily.    [provider]  atenolol (TENORMIN) 50 MG tablet TAKE 2 TABLETS BY MOUTH EVERY DAY 10/28/21   Einar Pheasant, MD  estrogens, conjugated, (PREMARIN) 0.3 MG tablet Take 1 tablet (0.3 mg total) by mouth daily. Take daily for 21 days then do not take for 7 days. 09/10/21   Einar Pheasant, MD  fexofenadine (ALLEGRA) 180 MG tablet Take 180 mg by mouth as needed.     [provider]  lactase (LACTAID) 3000 UNITS tablet Take 1 tablet by mouth as needed.    [provider]  LORazepam (ATIVAN) 0.5 MG tablet TAKE 1/2 TAB BY MOUTH AT BEDTIME AS NEEDED 05/21/21   Einar Pheasant, MD  meclizine (ANTIVERT) 12.5 MG tablet Take 1 tablet (12.5 mg total) by mouth 3 (three) times daily as needed for dizziness. Patient not taking: Reported on  10/23/2021 10/15/21   Burnard Hawthorne, FNP  mupirocin ointment (BACTROBAN) 2 % Apply 1 application topically 2 (two) times daily. 06/19/21   Einar Pheasant, MD  progesterone (PROMETRIUM) 100 MG capsule TAKE 1 CAPSULE BY MOUTH EVERY DAY 10/31/21   Einar Pheasant, MD  rosuvastatin (CRESTOR) 10 MG tablet Take 1 tablet (10 mg total) by mouth daily. 09/17/21   Einar Pheasant, MD    Family History Family History  Problem Relation Age of Onset   Stroke Mother    Hypertension Mother    Fibromyalgia Sister    Breast cancer Neg Hx    Colon cancer Neg Hx      Social History Social History   Tobacco Use   Smoking status: Never   Smokeless tobacco: Never  Vaping Use   Vaping Use: Never used  Substance Use Topics   Alcohol use: No    Alcohol/week: 0.0 standard drinks   Drug use: No     Allergies   Avelox [moxifloxacin hcl in nacl], Mucinex [guaifenesin er], Penicillins, Sulfa antibiotics, and Maxitrol [neomycin-polymyxin-dexameth]   Review of Systems Review of Systems  Constitutional:  Negative for chills and fever.  HENT:  Positive for congestion, rhinorrhea and sneezing. Negative for ear pain and sore throat.   Respiratory:  Positive for cough. Negative for shortness of breath.   Cardiovascular:  Negative for chest pain and palpitations.  Gastrointestinal:  Negative for diarrhea and vomiting.  Skin:  Negative for color change and rash.  All other systems reviewed and are negative.   Physical Exam Triage Vital Signs ED Triage Vitals  Enc Vitals Group     BP 11/15/21 1202 (!) 145/75     Pulse Rate 11/15/21 1202 69     Resp 11/15/21 1202 18     Temp 11/15/21 1202 98.4 F (36.9 C)     Temp src --      SpO2 11/15/21 1202 95 %     Weight --      Height --      Head Circumference --      Peak Flow --      Pain Score 11/15/21 1231 0     Pain Loc --      Pain Edu? --      Excl. in [place]? --    No data found.  Updated Vital Signs BP (!) 145/75    Pulse 69    Temp 98.4 F (36.9 C)    Resp 18    SpO2 95%   Visual Acuity Right Eye Distance:   Left Eye Distance:   Bilateral Distance:    Right Eye Near:   Left Eye Near:    Bilateral Near:     Physical Exam Vitals and nursing note reviewed.  Constitutional:      General: She is not in acute distress.    Appearance: Normal appearance. She is well-developed. She is not ill-appearing.  HENT:     Right Ear: Tympanic membrane normal.     Left Ear: Tympanic membrane normal.     Nose: Rhinorrhea present.     Mouth/Throat:     Mouth: Mucous membranes are moist.      Pharynx: Oropharynx is clear.  Cardiovascular:     Rate and Rhythm: Normal rate and regular rhythm.     Heart sounds: Normal heart sounds.  Pulmonary:     Effort: Pulmonary effort is normal. No respiratory distress.     Breath sounds: Normal breath sounds.  Musculoskeletal:  Cervical back: Neck supple.  Skin:    General: Skin is warm and dry.  Neurological:     Mental Status: She is alert.  Psychiatric:        Mood and Affect: Mood normal.        Behavior: Behavior normal.     UC Treatments / Results  Labs (all labs ordered are listed, but only abnormal results are displayed) Labs Reviewed  NOVEL CORONAVIRUS, NAA    EKG   Radiology No results found.  Procedures Procedures (including critical care time)  Medications Ordered in UC Medications - No data to display  Initial Impression / Assessment and Plan / UC Course  I have reviewed the triage vital signs and the nursing notes.  Pertinent labs & imaging results that were available during my care of the patient were reviewed by me and considered in my medical decision making (see chart for details).   COVID-19.  Patient tested positive for COVID at home yesterday; she requests PCR test today.  Discussed treatment options.  She declines COVID antiviral treatment at this time.  I encouraged her to talk to her PCP on Monday to discuss this.  I also encouraged her to call me over the weekend if she changes her mind about treatment.  Discussed CDC guidelines for quarantine.  Discussed ED precautions. She agrees to plan of care.    Final Clinical Impressions(s) / UC Diagnoses   Final diagnoses:  [id number]     Discharge Instructions      Since your COVID test at home was positive, please call your doctor on Monday to talk about whether you should take the COVID medication.  If you change your mind about taking it before then, please let me know and I will call it to your pharmacy.    The PCR COVID test done here is  pending.    You should self-quarantine according to the CDC guidelines.  See attached.    Most people do not need to be re-tested at the end of the quarantine period.    Go to the emergency department if you have high fever, shortness of breath, severe diarrhea, or other concerning symptoms.        ED Prescriptions   None    PDMP not reviewed this encounter.   Sharion Balloon, NP 11/15/21 1304

## 2021-11-15 NOTE — Discharge Instructions (Addendum)
Since your COVID test at home was positive, please call your doctor on Monday to talk about whether you should take the COVID medication.  If you change your mind about taking it before then, please let me know and I will call it to your pharmacy.    The PCR COVID test done here is pending.    You should self-quarantine according to the CDC guidelines.  See attached.    Most people do not need to be re-tested at the end of the quarantine period.    Go to the emergency department if you have high fever, shortness of breath, severe diarrhea, or other concerning symptoms.

## 2021-11-17 LAB — NOVEL CORONAVIRUS, NAA: SARS-CoV-2, NAA: DETECTED — AB

## 2021-11-18 NOTE — Telephone Encounter (Signed)
LMTCB

## 2021-11-19 NOTE — Telephone Encounter (Signed)
LMTCB

## 2021-11-19 NOTE — Telephone Encounter (Signed)
Letter mailed

## 2021-11-24 ENCOUNTER — Telehealth: Payer: Self-pay

## 2021-11-24 ENCOUNTER — Ambulatory Visit (INDEPENDENT_AMBULATORY_CARE_PROVIDER_SITE_OTHER): Payer: Medicare Other | Admitting: Internal Medicine

## 2021-11-24 DIAGNOSIS — F439 Reaction to severe stress, unspecified: Secondary | ICD-10-CM

## 2021-11-24 DIAGNOSIS — I10 Essential (primary) hypertension: Secondary | ICD-10-CM | POA: Diagnosis not present

## 2021-11-24 DIAGNOSIS — E7800 Pure hypercholesterolemia, unspecified: Secondary | ICD-10-CM | POA: Diagnosis not present

## 2021-11-24 DIAGNOSIS — E1165 Type 2 diabetes mellitus with hyperglycemia: Secondary | ICD-10-CM

## 2021-11-24 DIAGNOSIS — D72829 Elevated white blood cell count, unspecified: Secondary | ICD-10-CM | POA: Diagnosis not present

## 2021-11-24 DIAGNOSIS — R42 Dizziness and giddiness: Secondary | ICD-10-CM | POA: Diagnosis not present

## 2021-11-24 DIAGNOSIS — N1831 Chronic kidney disease, stage 3a: Secondary | ICD-10-CM | POA: Diagnosis not present

## 2021-11-24 NOTE — Telephone Encounter (Signed)
ERROR

## 2021-11-24 NOTE — Telephone Encounter (Signed)
Per Dr Nicki Reaper - called patient to clarify how she is taking Atenolol and Amlodipine. I asked pt to please read the directions off the bottles and confirm that that is how she is taking the medication.  As per patient :  She takes Atenolol 2 pills by mouth once daily  And she takes Amlodipine 1 pill once daily.

## 2021-11-24 NOTE — Progress Notes (Signed)
Patient ID: Kara Dixon, female   DOB: [birth year], 86 y.o.   MRN: [id number]   Virtual Visit via telephone Note  This visit type was conducted due to national recommendations for restrictions regarding the COVID-19 pandemic (e.g. social distancing).  This format is felt to be most appropriate for this patient at this time.  All issues noted in this document were discussed and addressed.  No physical exam was performed (except for noted visual exam findings with Video Visits).   I connected with Kara Dixon by telephone and verified that I am speaking with the correct person using two identifiers. Location patient: home Location provider: work Persons participating in the telephone visit: patient, provider  The limitations, risks, security and privacy concerns of performing an evaluation and management service by telephone and the availability of in person appointments have been discussed.  It has also been discussed with the patient that there may be a patient responsible charge related to this service. The patient expressed understanding and agreed to proceed.   Reason for visit: follow up appt.   HPI: Was recently evaluated urgent care 11/15/21. Presented with cough, congestion and runny nose.  Tested positive for covid 11/14/21.  Declined oral antiviral medication.  States she is feeling better.  No sore throat now.  Minimal nasal congestion and drainage.  Minimal cough.  No chest pain or sob.  Some decreased appetite, but this is improving.  No nausea, vomiting or diarrhea.  Still having issues with dizziness.  Notices - worse when first wakes up and when she lies down.  Describes as room spinning.  Does not notice as much during the day. Persistent - -as outlined. Had referred to ENT.     ROS: See pertinent positives and negatives per HPI.  Past Medical History:  Diagnosis Date   Cancer ([place])    skin   Hypercholesterolemia    Hypertension    Nephrolithiasis    Osteopenia     Vitamin D deficiency     Past Surgical History:  Procedure Laterality Date   CATARACT EXTRACTION W/PHACO Right 02/11/2021   Procedure: CATARACT EXTRACTION PHACO AND INTRAOCULAR LENS PLACEMENT ([place]) RIGHT;  Surgeon: Birder Robson, MD;  Location: [facility];  Service: Ophthalmology;  Laterality: Right;  CDE15.19 01:16.1 minutes   CATARACT EXTRACTION W/PHACO Left 02/25/2021   Procedure: CATARACT EXTRACTION PHACO AND INTRAOCULAR LENS PLACEMENT ([facility]) LEFT;  Surgeon: Birder Robson, MD;  Location: [facility];  Service: Ophthalmology;  Laterality: Left;  18.25 01:23.4   cyst removed under tongue  age 31   EYE SURGERY  02/2017   EYE LID; care everywhere    Family History  Problem Relation Age of Onset   Stroke Mother    Hypertension Mother    Fibromyalgia Sister    Breast cancer Neg Hx    Colon cancer Neg Hx     SOCIAL HX: reviewed.    Current Outpatient Medications:    amLODipine (NORVASC) 5 MG tablet, TAKE 1 TABLET BY MOUTH TWICE A DAY, Disp: 180 tablet, Rfl: 1   aspirin 81 MG tablet, Take 81 mg by mouth daily., Disp: , Rfl:    atenolol (TENORMIN) 50 MG tablet, TAKE 2 TABLETS BY MOUTH EVERY DAY, Disp: 60 tablet, Rfl: 1   estrogens, conjugated, (PREMARIN) 0.3 MG tablet, Take 1 tablet (0.3 mg total) by mouth daily. Take daily for 21 days then do not take for 7 days., Disp: 30 tablet, Rfl: 4   fexofenadine (ALLEGRA) 180 MG tablet, Take 180 mg  by mouth as needed. , Disp: , Rfl:    lactase (LACTAID) 3000 UNITS tablet, Take 1 tablet by mouth as needed., Disp: , Rfl:    LORazepam (ATIVAN) 0.5 MG tablet, TAKE 1/2 TAB BY MOUTH AT BEDTIME AS NEEDED, Disp: 15 tablet, Rfl: 0   meclizine (ANTIVERT) 12.5 MG tablet, Take 1 tablet (12.5 mg total) by mouth 3 (three) times daily as needed for dizziness. (Patient not taking: Reported on 10/23/2021), Disp: 30 tablet, Rfl: 0   mupirocin ointment (BACTROBAN) 2 %, Apply 1 application topically 2 (two) times daily., Disp: 22 g, Rfl: 0    progesterone (PROMETRIUM) 100 MG capsule, TAKE 1 CAPSULE BY MOUTH EVERY DAY, Disp: 30 capsule, Rfl: 1   rosuvastatin (CRESTOR) 10 MG tablet, Take 1 tablet (10 mg total) by mouth daily., Disp: 90 tablet, Rfl: 1  EXAM:  GENERAL: alert. Sounds to be in no acute distress.  Answering questions appropriately.   PSYCH/NEURO: pleasant and cooperative, no obvious depression or anxiety, speech and thought processing grossly intact  ASSESSMENT AND PLAN:  Discussed the following assessment and plan:  Problem List Items Addressed This Visit     CKD (chronic kidney disease) stage 3, GFR 30-59 ml/min (HCC)    Avoid antiinflammatories.  Stay hydrated.  Follow metabolic panel.       Hypercholesterolemia    On crestor.  Low cholesterol diet and exercise.  Follow lipid panel and liver function tests.   Lab Results  Component Value Date   CHOL 106 07/29/2021   HDL 46.90 07/29/2021   LDLCALC 41 07/29/2021   LDLDIRECT 139.4 05/12/2013   TRIG 92.0 07/29/2021   CHOLHDL 2 07/29/2021       Relevant Orders   Hepatic function panel   Lipid panel   TSH   Hypertension    Reports blood pressure at home 194R systolic reading.  Confirm taking amlodipine $RemoveBeforeDEI'5mg'hcBFgKylKROaAYaw$  q day and attenolol $RemoveBefor'50mg'GwWMOgYpEntA$  - 2 per day.  Follow pressures and follow pulse rate.        Leukocytosis    Recheck cbc with next labs to confirm wnl.       Relevant Orders   CBC with Differential/Platelet   Stress    Overall appears to be handling things relatively well. Does not feel needs any further intervention.  Has good support.  Uses lorazepam prn (reports rarely using).        Type 2 diabetes mellitus with hyperglycemia (HCC)    Low carb diet and exercise given elevated blood sugar. Follow met b and a1c.   Lab Results  Component Value Date   HGBA1C 6.4 07/29/2021       Relevant Orders   Hemoglobin D4Y   Basic metabolic panel   Microalbumin / creatinine urine ratio   Vertigo    Persistent.  In reviewing started end of December.   Previous "cold" symptoms.  EKG - UC - NSR with no acute ischemic changes.  Labs unrevealing for etiology.  Instructed to stay hydrated.  It appears she has decreased amlodipine to $RemoveBefor'5mg'VkAtKiddTjYE$  q day (instead of bid).  Appears to still be taking atenolol $RemoveBeforeD'50mg'gbJpLEqqHILNoA$  (two per day).  Blood pressure previously slight drop with standing.  Has not taken meclizine or zofran.  Dizziness previously reproducible on exam.  Increased dizziness with position changes and movements.   Has referred to ENT for further evaluation and treatment.  Have discussed possible CVA and further neuroimaging (including MRI).  She has wanted to hold on head scan.  F/u with ENT appt.  Return in about 6 weeks (around 01/05/2022) for follow up appt (60mn) .   I discussed the assessment and treatment plan with the patient. The patient was provided an opportunity to ask questions and all were answered. The patient agreed with the plan and demonstrated an understanding of the instructions.   The patient was advised to call back or seek an in-person evaluation if the symptoms worsen or if the condition fails to improve as anticipated.  I provided 18 minutes of non-face-to-face time during this encounter.   CEinar Pheasant MD

## 2021-11-25 ENCOUNTER — Telehealth: Payer: Self-pay | Admitting: Internal Medicine

## 2021-11-25 ENCOUNTER — Encounter: Payer: Self-pay | Admitting: Internal Medicine

## 2021-11-25 NOTE — Assessment & Plan Note (Signed)
On crestor.  Low cholesterol diet and exercise.  Follow lipid panel and liver function tests.   Lab Results  Component Value Date   CHOL 106 07/29/2021   HDL 46.90 07/29/2021   LDLCALC 41 07/29/2021   LDLDIRECT 139.4 05/12/2013   TRIG 92.0 07/29/2021   CHOLHDL 2 07/29/2021

## 2021-11-25 NOTE — Assessment & Plan Note (Signed)
Reports blood pressure at home 397X systolic reading.  Confirm taking amlodipine 5mg  q day and attenolol 50mg  - 2 per day.  Follow pressures and follow pulse rate.

## 2021-11-25 NOTE — Telephone Encounter (Signed)
Lm to call office to schedule a follow up and labs 2 days before follow up appt.

## 2021-11-25 NOTE — Assessment & Plan Note (Signed)
Low carb diet and exercise given elevated blood sugar. Follow met b and a1c.   °Lab Results  °Component Value Date  ° HGBA1C 6.4 07/29/2021  ° °

## 2021-11-25 NOTE — Telephone Encounter (Signed)
Please schedule a 6 week f/u and fasting labs 1-2 days before appt.  Thanks    Dr Nicki Reaper

## 2021-11-25 NOTE — Assessment & Plan Note (Signed)
Avoid antiinflammatories.  Stay hydrated.  Follow metabolic panel.   

## 2021-11-25 NOTE — Assessment & Plan Note (Signed)
Persistent.  In reviewing started end of December.  Previous "cold" symptoms.  EKG - UC - NSR with no acute ischemic changes.  Labs unrevealing for etiology.  Instructed to stay hydrated.  It appears she has decreased amlodipine to 5mg  q day (instead of bid).  Appears to still be taking atenolol 50mg  (two per day).  Blood pressure previously slight drop with standing.  Has not taken meclizine or zofran.  Dizziness previously reproducible on exam.  Increased dizziness with position changes and movements.   Has referred to ENT for further evaluation and treatment.  Have discussed possible CVA and further neuroimaging (including MRI).  She has wanted to hold on head scan.  F/u with ENT appt.

## 2021-11-25 NOTE — Assessment & Plan Note (Signed)
Recheck cbc with next labs to confirm wnl.

## 2021-11-25 NOTE — Assessment & Plan Note (Signed)
Overall appears to be handling things relatively well. Does not feel needs any further intervention.  Has good support.  Uses lorazepam prn (reports rarely using).

## 2021-12-01 ENCOUNTER — Other Ambulatory Visit: Payer: Self-pay | Admitting: Internal Medicine

## 2021-12-01 ENCOUNTER — Telehealth: Payer: Self-pay | Admitting: Internal Medicine

## 2021-12-01 DIAGNOSIS — R42 Dizziness and giddiness: Secondary | ICD-10-CM

## 2021-12-01 MED ORDER — LORAZEPAM 0.5 MG PO TABS: ORAL_TABLET | ORAL | 0 refills | Status: DC

## 2021-12-01 NOTE — Telephone Encounter (Signed)
Lorazepam #15 with no refills.

## 2021-12-01 NOTE — Addendum Note (Signed)
Addended by: Alisa Graff on: 12/01/2021 09:11 PM   Modules accepted: Orders

## 2021-12-01 NOTE — Telephone Encounter (Signed)
[facility] in [place] called and is requesting a refill on patient's LORazepam (ATIVAN) 0.5 MG tablet.

## 2021-12-01 NOTE — Telephone Encounter (Signed)
Rx ok'd for lorazepam #15 with no refills.

## 2021-12-10 ENCOUNTER — Telehealth: Payer: Self-pay | Admitting: Internal Medicine

## 2021-12-10 NOTE — Telephone Encounter (Signed)
-----   Message from Ashley Jacobs sent at 12/10/2021  4:25 PM EST ----- ?Regarding: RE: ent referral ?I called to [place] ent pt was called on 10/24/21 vm was full. I called pt and left pt vm today to call [facility] ent to sch with the number of [phone]. ? ? ?----- Message ----- ?From: Einar Pheasant, MD ?Sent: 11/25/2021   5:16 AM EST ?To: Constance Haw YoungBlood ?Subject: ent referral                                  ? ?I had placed an urgent referral for ENT in January.  Pt reports never heard about appt.  Still with persistent dizziness.  Can you help with this?   ? ?Thank you  ?Dr Nicki Reaper ? ? ?

## 2021-12-11 DIAGNOSIS — H02055 Trichiasis without entropian left lower eyelid: Secondary | ICD-10-CM | POA: Diagnosis not present

## 2021-12-26 ENCOUNTER — Telehealth: Payer: Self-pay

## 2021-12-26 NOTE — Telephone Encounter (Signed)
LM for pre visit screening ?

## 2021-12-27 ENCOUNTER — Other Ambulatory Visit: Payer: Self-pay | Admitting: Internal Medicine

## 2021-12-27 DIAGNOSIS — R42 Dizziness and giddiness: Secondary | ICD-10-CM

## 2021-12-29 ENCOUNTER — Ambulatory Visit (INDEPENDENT_AMBULATORY_CARE_PROVIDER_SITE_OTHER): Payer: Medicare Other | Admitting: Internal Medicine

## 2021-12-29 ENCOUNTER — Ambulatory Visit (INDEPENDENT_AMBULATORY_CARE_PROVIDER_SITE_OTHER): Payer: Medicare Other

## 2021-12-29 ENCOUNTER — Other Ambulatory Visit: Payer: Self-pay

## 2021-12-29 VITALS — BP 148/74 | HR 62 | Temp 98.0°F | Resp 16 | Ht 62.0 in | Wt 115.5 lb

## 2021-12-29 DIAGNOSIS — N1831 Chronic kidney disease, stage 3a: Secondary | ICD-10-CM | POA: Diagnosis not present

## 2021-12-29 DIAGNOSIS — E7800 Pure hypercholesterolemia, unspecified: Secondary | ICD-10-CM

## 2021-12-29 DIAGNOSIS — R059 Cough, unspecified: Secondary | ICD-10-CM

## 2021-12-29 DIAGNOSIS — D72829 Elevated white blood cell count, unspecified: Secondary | ICD-10-CM | POA: Diagnosis not present

## 2021-12-29 DIAGNOSIS — R634 Abnormal weight loss: Secondary | ICD-10-CM

## 2021-12-29 DIAGNOSIS — E041 Nontoxic single thyroid nodule: Secondary | ICD-10-CM | POA: Diagnosis not present

## 2021-12-29 DIAGNOSIS — R002 Palpitations: Secondary | ICD-10-CM | POA: Diagnosis not present

## 2021-12-29 DIAGNOSIS — I10 Essential (primary) hypertension: Secondary | ICD-10-CM | POA: Diagnosis not present

## 2021-12-29 DIAGNOSIS — E1165 Type 2 diabetes mellitus with hyperglycemia: Secondary | ICD-10-CM

## 2021-12-29 DIAGNOSIS — Z1231 Encounter for screening mammogram for malignant neoplasm of breast: Secondary | ICD-10-CM

## 2021-12-29 DIAGNOSIS — R42 Dizziness and giddiness: Secondary | ICD-10-CM | POA: Diagnosis not present

## 2021-12-29 LAB — BASIC METABOLIC PANEL
BUN: 12 mg/dL (ref 6–23)
CO2: 28 mEq/L (ref 19–32)
Calcium: 9.7 mg/dL (ref 8.4–10.5)
Chloride: 102 mEq/L (ref 96–112)
Creatinine, Ser: 0.99 mg/dL (ref 0.40–1.20)
GFR: 52.67 mL/min — ABNORMAL LOW (ref >60.00)
Glucose, Bld: 96 mg/dL (ref 70–99)
Potassium: 4.3 mEq/L (ref 3.5–5.1)
Sodium: 138 mEq/L (ref 135–145)

## 2021-12-29 LAB — CBC WITH DIFFERENTIAL/PLATELET
Basophils Absolute: 0.1 10*3/uL (ref 0.0–0.1)
Basophils Relative: 0.7 % (ref 0.0–3.0)
Eosinophils Absolute: 0.2 10*3/uL (ref 0.0–0.7)
Eosinophils Relative: 3.2 % (ref 0.0–5.0)
HCT: 38.6 % (ref 36.0–46.0)
Hemoglobin: 13.5 g/dL (ref 12.0–15.0)
Lymphocytes Relative: 22.4 % (ref 12.0–46.0)
Lymphs Abs: 1.6 10*3/uL (ref 0.7–4.0)
MCHC: 35 g/dL (ref 30.0–36.0)
MCV: 97.7 fl (ref 78.0–100.0)
Monocytes Absolute: 0.6 10*3/uL (ref 0.1–1.0)
Monocytes Relative: 8.2 % (ref 3.0–12.0)
Neutro Abs: 4.7 10*3/uL (ref 1.4–7.7)
Neutrophils Relative %: 65.5 % (ref 43.0–77.0)
Platelets: 224 10*3/uL (ref 150.0–400.0)
RBC: 3.95 Mil/uL (ref 3.87–5.11)
RDW: 12.6 % (ref 11.5–15.5)
WBC: 7.2 10*3/uL (ref 4.0–10.5)

## 2021-12-29 LAB — LIPID PANEL
Cholesterol: 104 mg/dL (ref 0–200)
HDL: 45.4 mg/dL (ref >39.00)
LDL Cholesterol: 37 mg/dL (ref 0–99)
NonHDL: 58.8
Total CHOL/HDL Ratio: 2
Triglycerides: 107 mg/dL (ref 0.0–149.0)
VLDL: 21.4 mg/dL (ref 0.0–40.0)

## 2021-12-29 LAB — HEMOGLOBIN A1C
Hgb A1c MFr Bld: 5.9 % (ref 4.6–6.5)
Hgb A1c MFr Bld: 5.9 % (ref 4.6–6.5)

## 2021-12-29 LAB — HEPATIC FUNCTION PANEL
ALT: 8 U/L (ref 0–35)
AST: 17 U/L (ref 0–37)
Albumin: 4.5 g/dL (ref 3.5–5.2)
Alkaline Phosphatase: 47 U/L (ref 39–117)
Bilirubin, Direct: 0.2 mg/dL (ref 0.0–0.3)
Total Bilirubin: 0.8 mg/dL (ref 0.2–1.2)
Total Protein: 7.4 g/dL (ref 6.0–8.3)

## 2021-12-29 LAB — MICROALBUMIN / CREATININE URINE RATIO
Creatinine,U: 173.1 mg/dL
Microalb Creat Ratio: 2 mg/g (ref 0.0–30.0)
Microalb, Ur: 3.5 mg/dL — ABNORMAL HIGH (ref 0.0–1.9)

## 2021-12-29 MED ORDER — AMLODIPINE BESYLATE 5 MG PO TABS
5.0000 mg | ORAL_TABLET | Freq: Every day | ORAL | 1 refills | Status: DC
Start: 2021-12-29 — End: 2022-01-26

## 2021-12-29 MED ORDER — ATENOLOL 50 MG PO TABS: ORAL_TABLET | ORAL | 1 refills | Status: DC

## 2021-12-29 NOTE — Progress Notes (Signed)
Patient ID: Kara Dixon, female   DOB: 07-07-1936, 86 y.o.   MRN: [id number] ? ? ?Subjective:  ? ? Patient ID: Kara Dixon, female    DOB: 01-29-1936, 86 y.o.   MRN: [id number] ? ?This visit occurred during the SARS-CoV-2 public health emergency.  Safety protocols were in place, including screening questions prior to the visit, additional usage of staff PPE, and extensive cleaning of exam room while observing appropriate contact time as indicated for disinfecting solutions.  ? ?Patient here for a scheduled follow up.  ? ?Chief Complaint  ?Patient presents with  ? Dizziness  ? .  ? ?HPI ?Recently evaluated for dizziness.  Was evaluated at Urgent Care 10/08/21 - after waking up to go to bathroom and felt like room was spinning. Position changes worsened.  Associated nausea.  In urgent care, EKG - no acute changes. Prescription given for zofran.  Reevaluated 10/15/21 - [name]) - diagnosed vertigo - improved.  Was instructed at that time to decrease atenolol and was given rx for antivert.  She did not take antivert and it appears she decreased amlodipine instead of atenolol.  Given dizziness more positional at that time and had described the room spinning, was referred to ENT for further evaluation.  Has not seen ENT to date.  She comes in today with persistent intermittent episodes of dizziness or light headedness.  Also reports decreased appetite.  States the thought of food can make her sick. She is eating some.  Denies nausea or vomiting.  No abdominal pain.  States when she eats sugar - increases mucus in her mouth.  Having bowel movements, but describes as small.  Denies any significant constipation.  Diagnosed with covid 11/14/21.  States she is feeling better from that infection, but does report some persistent intermittent cough.  No chest pain. Breathing overall stable.  Legs feel weak.  Has lost weight.   ? ?Past Medical History:  ?Diagnosis Date  ? Cancer [facility])   ? skin  ? Hypercholesterolemia   ?  Hypertension   ? Nephrolithiasis   ? Osteopenia   ? Vitamin D deficiency   ? ?Past Surgical History:  ?Procedure Laterality Date  ? CATARACT EXTRACTION W/PHACO Right 02/11/2021  ? Procedure: CATARACT EXTRACTION PHACO AND INTRAOCULAR LENS PLACEMENT ([place]) RIGHT;  Surgeon: Birder Robson, MD;  Location: [place];  Service: Ophthalmology;  Laterality: Right;  CDE15.19 ?01:16.1 minutes  ? CATARACT EXTRACTION W/PHACO Left 02/25/2021  ? Procedure: CATARACT EXTRACTION PHACO AND INTRAOCULAR LENS PLACEMENT ([place]) LEFT;  Surgeon: Birder Robson, MD;  Location: [place];  Service: Ophthalmology;  Laterality: Left;  18.25 ?01:23.4  ? cyst removed under tongue  age 86  ? EYE SURGERY  02/2017  ? EYE LID; care everywhere  ? ?Family History  ?Problem Relation Age of Onset  ? Stroke Mother   ? Hypertension Mother   ? Fibromyalgia Sister   ? Breast cancer Neg Hx   ? Colon cancer Neg Hx   ? ?Social History  ? ?Socioeconomic History  ? Marital status: Married  ?  Spouse name: Not on file  ? Number of children: 1  ? Years of education: Not on file  ? Highest education level: Not on file  ?Occupational History  ? Not on file  ?Tobacco Use  ? Smoking status: Never  ? Smokeless tobacco: Never  ?Vaping Use  ? Vaping Use: Never used  ?Substance and Sexual Activity  ? Alcohol use: No  ?  Alcohol/week: 0.0 standard drinks  ?  Drug use: No  ? Sexual activity: Not Currently  ?Other Topics Concern  ? Not on file  ?Social History Narrative  ? Not on file  ? ?Social Determinants of Health  ? ?Financial Resource Strain: Low Risk   ? Difficulty of Paying Living Expenses: Not hard at all  ?Food Insecurity: No Food Insecurity  ? Worried About Charity fundraiser in the Last Year: Never true  ? Ran Out of Food in the Last Year: Never true  ?Transportation Needs: No Transportation Needs  ? Lack of Transportation (Medical): No  ? Lack of Transportation (Non-Medical): No  ?Physical Activity: Unknown  ? Days of Exercise per Week: 0  days  ? Minutes of Exercise per Session: Not on file  ?Stress: No Stress Concern Present  ? Feeling of Stress : Not at all  ?Social Connections: Unknown  ? Frequency of Communication with Friends and Family: More than three times a week  ? Frequency of Social Gatherings with Friends and Family: Not on file  ? Attends Religious Services: Not on file  ? Active Member of Clubs or Organizations: Not on file  ? Attends Archivist Meetings: Not on file  ? Marital Status: Married  ? ? ? ?Review of Systems  ?Constitutional:  Positive for fatigue. Negative for fever.  ?     Decreased appetite.  Decreased weight.   ?HENT:  Negative for congestion and sinus pressure.   ?Respiratory:  Negative for chest tightness and shortness of breath.   ?     Some cough as outlined.   ?Cardiovascular:  Negative for chest pain, palpitations and leg swelling.  ?Gastrointestinal:  Negative for abdominal pain, diarrhea, nausea and vomiting.  ?Genitourinary:  Negative for difficulty urinating and dysuria.  ?Musculoskeletal:  Negative for joint swelling and myalgias.  ?Skin:  Negative for color change and rash.  ?Neurological:  Positive for dizziness and light-headedness. Negative for headaches.  ?Psychiatric/Behavioral:  Negative for agitation and dysphoric mood.   ? ?   ?Objective:  ?  ? ?BP (!) 148/74   Pulse 62   Temp 98 ?F (36.7 ?C) (Oral)   Resp 16   Ht '5\' 2"'$  (1.575 m)   Wt 115 lb 8 oz (52.4 kg)   SpO2 98%   BMI 21.13 kg/m?  ?Wt Readings from Last 3 Encounters:  ?12/29/21 115 lb 8 oz (52.4 kg)  ?10/23/21 125 lb 12.8 oz (57.1 kg)  ?10/15/21 126 lb (57.2 kg)  ? ?Orthostatic on exam:  lying 150/78. Standing 114/64.  ? ?Physical Exam ?Vitals reviewed.  ?Constitutional:   ?   General: She is not in acute distress. ?   Appearance: Normal appearance.  ?HENT:  ?   Head: Normocephalic and atraumatic.  ?   Right Ear: External ear normal.  ?   Left Ear: External ear normal.  ?Eyes:  ?   General: No scleral icterus.    ?   Right eye: No  discharge.     ?   Left eye: No discharge.  ?   Conjunctiva/sclera: Conjunctivae normal.  ?Neck:  ?   Thyroid: No thyromegaly.  ?Cardiovascular:  ?   Rate and Rhythm: Normal rate and regular rhythm.  ?Pulmonary:  ?   Effort: No respiratory distress.  ?   Breath sounds: Normal breath sounds. No wheezing.  ?Abdominal:  ?   General: Bowel sounds are normal.  ?   Palpations: Abdomen is soft.  ?   Tenderness: There is no abdominal tenderness.  ?  Musculoskeletal:     ?   General: No swelling or tenderness.  ?   Cervical back: Neck supple. No tenderness.  ?Lymphadenopathy:  ?   Cervical: No cervical adenopathy.  ?Skin: ?   Findings: No erythema or rash.  ?Neurological:  ?   Mental Status: She is alert.  ?Psychiatric:     ?   Mood and Affect: Mood normal.     ?   Behavior: Behavior normal.  ? ? ? ?Outpatient Encounter Medications as of 12/29/2021  ?Medication Sig  ? aspirin 81 MG tablet Take 81 mg by mouth daily.  ? estrogens, conjugated, (PREMARIN) 0.3 MG tablet Take 1 tablet (0.3 mg total) by mouth daily. Take daily for 21 days then do not take for 7 days.  ? fexofenadine (ALLEGRA) 180 MG tablet Take 180 mg by mouth as needed.   ? lactase (LACTAID) 3000 UNITS tablet Take 1 tablet by mouth as needed.  ? LORazepam (ATIVAN) 0.5 MG tablet TAKE 1/2 TAB BY MOUTH AT BEDTIME AS NEEDED  ? progesterone (PROMETRIUM) 100 MG capsule TAKE 1 CAPSULE BY MOUTH EVERY DAY  ? rosuvastatin (CRESTOR) 10 MG tablet Take 1 tablet (10 mg total) by mouth daily.  ? [DISCONTINUED] amLODipine (NORVASC) 5 MG tablet TAKE 1 TABLET BY MOUTH TWICE A DAY  ? [DISCONTINUED] atenolol (TENORMIN) 50 MG tablet TAKE 2 TABLETS BY MOUTH EVERY DAY  ? amLODipine (NORVASC) 5 MG tablet Take 1 tablet (5 mg total) by mouth daily.  ? atenolol (TENORMIN) 50 MG tablet One tablet q day  ? [DISCONTINUED] meclizine (ANTIVERT) 12.5 MG tablet Take 1 tablet (12.5 mg total) by mouth 3 (three) times daily as needed for dizziness. (Patient not taking: Reported on 10/23/2021)  ?  [DISCONTINUED] mupirocin ointment (BACTROBAN) 2 % Apply 1 application topically 2 (two) times daily. (Patient not taking: Reported on 12/29/2021)  ? ?No facility-administered encounter medications on file as of 12/29/2021.

## 2021-12-29 NOTE — Patient Instructions (Signed)
Decrease atenolol to one tablet per day ? ?Continue amlodipine - one per day.  ?

## 2021-12-30 ENCOUNTER — Encounter: Payer: Self-pay | Admitting: Internal Medicine

## 2021-12-30 DIAGNOSIS — R42 Dizziness and giddiness: Secondary | ICD-10-CM | POA: Insufficient documentation

## 2021-12-30 LAB — TSH: TSH: 3.52 u[IU]/mL (ref 0.35–5.50)

## 2021-12-30 NOTE — Assessment & Plan Note (Signed)
Had covid in February.  Some persistent intermittent cough. Weight loss.  Check cxr.   ?

## 2021-12-30 NOTE — Assessment & Plan Note (Signed)
On crestor. Follow lipid panel and liver function tests.   ?Lab Results  ?Component Value Date  ? CHOL 106 07/29/2021  ? HDL 46.90 07/29/2021  ? [place] 41 07/29/2021  ? LDLDIRECT 139.4 05/12/2013  ? TRIG 92.0 07/29/2021  ? CHOLHDL 2 07/29/2021  ? ?

## 2021-12-30 NOTE — Assessment & Plan Note (Signed)
Reports decreased appetite.  Has lost weight.  Follow met b and a1c.  ?

## 2021-12-30 NOTE — Assessment & Plan Note (Signed)
Recently evaluated for dizziness.  Was evaluated at Urgent Care 10/08/21 - after waking up to go to bathroom and felt like room was spinning. Position changes worsened.  Associated nausea.  In urgent care, EKG - no acute changes. Prescription given for zofran.  Reevaluated 10/15/21 - Kara Dixon) - diagnosed vertigo - improved.  Was instructed at that time to decrease atenolol and was given rx for antivert.  She did not take antivert and it appears she decreased amlodipine instead of atenolol.  Given dizziness more positional at that time and had described the room spinning, was referred to ENT for further evaluation. Has not seen ENT to date.  Comes in today with persistent light headedness/dizziness.  Orthostatic on exam. Pulse rate 50.   Will decrease atenolol as outlined to '50mg'$  q day.  Follow pressures and pulse rate.  Check routine labs.  Discussed MRI.  Had previously declined.  Agreeable.   ?

## 2021-12-30 NOTE — Assessment & Plan Note (Signed)
Orthostatic on exam.  Pulse in the 50s.  Currently confirms taking amlodipine '5mg'$  q day and '50mg'$  atenolol - 2 per day.  Will decrease atenolol to '50mg'$  q day.  Follow pressure and pulse.  Check metabolic panel.  ?

## 2021-12-30 NOTE — Assessment & Plan Note (Signed)
Described intermittent palpitations.  No chest pain.  EKG - SR with ventricular rate 51.  Given orthostatic and given low pulse rate, will decrease atenolol to '50mg'$  q day.  Follow pressures and pulse.   ?

## 2021-12-30 NOTE — Assessment & Plan Note (Addendum)
Has had significant weight loss.  Decreased appetite.  Had covid 11/2021.  Could be contributing some to change in appetite. Denies nausea or vomiting.  Will check routine labs.  Check cxr given persistent intermittent cough.  Discussed further w/up including CT scan.  Encourage increased po intake.  Follow.  ?

## 2021-12-30 NOTE — Assessment & Plan Note (Signed)
Recheck cbc.  

## 2021-12-30 NOTE — Assessment & Plan Note (Signed)
Avoid antiinflammatories.  Stay hydrated.  Follow metabolic panel.   

## 2021-12-31 ENCOUNTER — Telehealth: Payer: Self-pay

## 2021-12-31 NOTE — Telephone Encounter (Signed)
-----   Message from Einar Pheasant, MD sent at 12/31/2021  2:38 AM EDT ----- ?Notify kidney function is stable.  Continue to stay hydrated.  Cholesterol levels look good.  Overall sugar control improved.  White blood cell count, hgb and liver function tests are wnl.  See cxr result note.  I had discussed with her regarding further w/up for her dizziness and the weight loss.  If still agreeable, I would like to check MRI brain and CT chest/abd/pelvis for persistent weight loss.  If questions, let me know.  If agreeable, I will order.   ?

## 2021-12-31 NOTE — Telephone Encounter (Signed)
LMTCB

## 2022-01-06 ENCOUNTER — Telehealth: Payer: Self-pay

## 2022-01-06 NOTE — Telephone Encounter (Signed)
-----   Message from Einar Pheasant, MD sent at 12/31/2021  2:38 AM EDT ----- ?Notify kidney function is stable.  Continue to stay hydrated.  Cholesterol levels look good.  Overall sugar control improved.  White blood cell count, hgb and liver function tests are wnl.  See cxr result note.  I had discussed with her regarding further w/up for her dizziness and the weight loss.  If still agreeable, I would like to check MRI brain and CT chest/abd/pelvis for persistent weight loss.  If questions, let me know.  If agreeable, I will order.   ?

## 2022-01-06 NOTE — Telephone Encounter (Signed)
Attempted to call pt re: results - cell phone off, lm ?No ans, no vm on home # ?

## 2022-01-23 ENCOUNTER — Other Ambulatory Visit: Payer: Self-pay | Admitting: Family

## 2022-01-23 ENCOUNTER — Other Ambulatory Visit: Payer: Self-pay | Admitting: Internal Medicine

## 2022-01-23 DIAGNOSIS — R42 Dizziness and giddiness: Secondary | ICD-10-CM

## 2022-01-31 ENCOUNTER — Other Ambulatory Visit: Payer: Self-pay

## 2022-01-31 ENCOUNTER — Ambulatory Visit
Admission: EM | Admit: 2022-01-31 | Discharge: 2022-01-31 | Disposition: A | Discharge status: Home / Self Care | Payer: Medicare Other | Attending: Physician Assistant | Admitting: Physician Assistant

## 2022-01-31 DIAGNOSIS — F410 Panic disorder [episodic paroxysmal anxiety] without agoraphobia: Secondary | ICD-10-CM | POA: Diagnosis not present

## 2022-01-31 DIAGNOSIS — B029 Zoster without complications: Secondary | ICD-10-CM | POA: Diagnosis not present

## 2022-01-31 MED ORDER — VALACYCLOVIR HCL 1 G PO TABS: 1000.0000 mg | ORAL_TABLET | Freq: Three times a day (TID) | ORAL | 0 refills | Status: AC

## 2022-01-31 NOTE — ED Provider Notes (Signed)
?[place] ? ? ? ?CSN: [id number] ?Arrival date & time: 01/31/22  1114 ? ? ?  ? ?History   ?Chief Complaint ?Chief Complaint  ?Patient presents with  ? Rash  ? Panic Attack  ? Shortness of Breath  ? ? ?HPI ?Kara Dixon is a 86 y.o. female presenting with her daughter for vesicular rash of the left upper back that she noticed today.  They have concern for shingles.  Patient has never had shingles.  She denies any associated pain or pruritus.  Does not report any insect bites or stings that she can recall.  No change in detergents or contact with any allergens. ? ?Patient and daughter report that she has been extremely anxious and had more panic attacks than normal over the past month since having COVID-19.  She has been prescribed lorazepam for anxiety.  States she had a panic attack last night and earlier today.  States she felt like her heart was racing and she was shaking and felt on edge.  She took 0.25 mg lorazepam and symptoms nearly resolved.  Now she is denying feeling anxious.  Denies any shortness of breath, palpitations, dizziness or weakness.  Patient says she takes lorazepam about 5 times every 2 weeks.  Reports she normally tries to use it for sleep.  Has difficulty sleeping at night.  States she was kept up last night until 1 or 2 in the morning feeling anxious.  She says she paces around her kitchen a lot when she gets anxious.  Reports she does have an appointment coming up with her PCP in 2 days but became concerned when she saw the rash.  Daughter believes her increased stress may have caused the rash. ? ?HPI ? ?Past Medical History:  ?Diagnosis Date  ? Cancer [facility])   ? skin  ? Hypercholesterolemia   ? Hypertension   ? Nephrolithiasis   ? Osteopenia   ? Vitamin D deficiency   ? ? ?Patient Active Problem List  ? Diagnosis Date Noted  ? Dizziness 12/30/2021  ? Palpitations 12/29/2021  ? Weight loss 12/29/2021  ? Cough 12/29/2021  ? Leukocytosis 10/23/2021  ? Vertigo 10/15/2021  ? Dry  skin 04/08/2021  ? Constipation 04/08/2021  ? CKD (chronic kidney disease) stage 3, GFR 30-59 ml/min (HCC) 04/08/2021  ? Stress 08/25/2020  ? Hyperglycemia 08/20/2020  ? Tremor 09/13/2017  ? Pharyngitis 11/22/2015  ? Current use of estrogen therapy 09/24/2015  ? Lumbar pain 02/06/2015  ? Health care maintenance 01/13/2015  ? Skin lesion 01/21/2014  ? Diverticulosis 03/16/2013  ? Type 2 diabetes mellitus with hyperglycemia ([place]) 01/08/2013  ? Vitamin D deficiency 10/17/2012  ? Hypertension 10/16/2012  ? Hypercholesterolemia 10/16/2012  ? Osteopenia 10/16/2012  ? ? ?Past Surgical History:  ?Procedure Laterality Date  ? CATARACT EXTRACTION W/PHACO Right 02/11/2021  ? Procedure: CATARACT EXTRACTION PHACO AND INTRAOCULAR LENS PLACEMENT ([place]) RIGHT;  Surgeon: Birder Robson, MD;  Location: [place];  Service: Ophthalmology;  Laterality: Right;  CDE15.19 ?01:16.1 minutes  ? CATARACT EXTRACTION W/PHACO Left 02/25/2021  ? Procedure: CATARACT EXTRACTION PHACO AND INTRAOCULAR LENS PLACEMENT ([place]) LEFT;  Surgeon: Birder Robson, MD;  Location: [place];  Service: Ophthalmology;  Laterality: Left;  18.25 ?01:23.4  ? cyst removed under tongue  age 67  ? EYE SURGERY  02/2017  ? EYE LID; care everywhere  ? ? ?OB History   ?No obstetric history on file. ?  ? ? ? ?Home Medications   ? ?Prior to Admission medications   ?  Medication Sig Start Date End Date Taking? Authorizing Provider  ?valACYclovir (VALTREX) 1000 MG tablet Take 1 tablet (1,000 mg total) by mouth 3 (three) times daily for 7 days. 01/31/22 02/07/22 Yes Laurene Footman B, PA-C  ?amLODipine (NORVASC) 5 MG tablet TAKE 1 TABLET (5 MG TOTAL) BY MOUTH DAILY. 01/26/22   Einar Pheasant, MD  ?aspirin 81 MG tablet Take 81 mg by mouth daily.    [provider]  ?atenolol (TENORMIN) 50 MG tablet One tablet q day 12/29/21   Einar Pheasant, MD  ?estrogens, conjugated, (PREMARIN) 0.3 MG tablet Take 1 tablet (0.3 mg total) by mouth daily. Take daily for 21  days then do not take for 7 days. 09/10/21   Einar Pheasant, MD  ?fexofenadine (ALLEGRA) 180 MG tablet Take 180 mg by mouth as needed.     [provider]  ?lactase (LACTAID) 3000 UNITS tablet Take 1 tablet by mouth as needed.    [provider]  ?LORazepam (ATIVAN) 0.5 MG tablet TAKE 1/2 TAB BY MOUTH AT BEDTIME AS NEEDED 12/01/21   Einar Pheasant, MD  ?progesterone (PROMETRIUM) 100 MG capsule TAKE 1 CAPSULE BY MOUTH EVERY DAY 10/31/21   Einar Pheasant, MD  ?rosuvastatin (CRESTOR) 10 MG tablet Take 1 tablet (10 mg total) by mouth daily. 09/17/21   Einar Pheasant, MD  ? ? ?Family History ?Family History  ?Problem Relation Age of Onset  ? Stroke Mother   ? Hypertension Mother   ? Fibromyalgia Sister   ? Breast cancer Neg Hx   ? Colon cancer Neg Hx   ? ? ?Social History ?Social History  ? ?Tobacco Use  ? Smoking status: Never  ? Smokeless tobacco: Never  ?Vaping Use  ? Vaping Use: Never used  ?Substance Use Topics  ? Alcohol use: No  ?  Alcohol/week: 0.0 standard drinks  ? Drug use: No  ? ? ? ?Allergies   ?Avelox [moxifloxacin hcl in nacl], Mucinex [guaifenesin er], Penicillins, Sulfa antibiotics, and Maxitrol [neomycin-polymyxin-dexameth] ? ? ?Review of Systems ?Review of Systems  ?Constitutional:  Positive for appetite change (reduced appetite since having COVID since it affected  her taste). Negative for fatigue and fever.  ?HENT:  Negative for congestion.   ?Respiratory:  Negative for cough and shortness of breath.   ?Cardiovascular:  Negative for chest pain.  ?Gastrointestinal:  Negative for abdominal pain, nausea and vomiting.  ?Musculoskeletal:  Negative for myalgias.  ?Skin:  Positive for rash.  ?Neurological:  Positive for dizziness (intermittently). Negative for weakness.  ?Psychiatric/Behavioral:  Positive for sleep disturbance. The patient is nervous/anxious.   ? ? ?Physical Exam ?Triage Vital Signs ?ED Triage Vitals  ?Enc Vitals Group  ?   BP   ?   Pulse   ?   Resp   ?   Temp   ?   Temp  src   ?   SpO2   ?   Weight   ?   Height   ?   Head Circumference   ?   Peak Flow   ?   Pain Score   ?   Pain Loc   ?   Pain Edu?   ?   Excl. in [place]?   ? ?No data found. ? ?Updated Vital Signs ?BP 125/77 (BP Location: Right Arm)   Pulse 65   Temp 98 ?F (36.7 ?C) (Oral)   Resp 18   SpO2 95%  ?   ? ?Physical Exam ?Vitals and nursing note reviewed.  ?Constitutional:   ?  General: She is not in acute distress. ?   Appearance: Normal appearance. She is not ill-appearing or toxic-appearing.  ?HENT:  ?   Head: Normocephalic and atraumatic.  ?   Nose: Nose normal.  ?   Mouth/Throat:  ?   Mouth: Mucous membranes are moist.  ?   Pharynx: Oropharynx is clear.  ?Eyes:  ?   General: No scleral icterus.    ?   Right eye: No discharge.     ?   Left eye: No discharge.  ?   Conjunctiva/sclera: Conjunctivae normal.  ?Cardiovascular:  ?   Rate and Rhythm: Normal rate and regular rhythm.  ?   Heart sounds: Normal heart sounds.  ?Pulmonary:  ?   Effort: Pulmonary effort is normal. No respiratory distress.  ?   Breath sounds: Normal breath sounds.  ?Musculoskeletal:  ?   Cervical back: Neck supple.  ?Skin: ?   General: Skin is dry.  ?   Findings: Rash (clustered vesicles of left upper back/left trapezius. Non tender) present.  ?Neurological:  ?   General: No focal deficit present.  ?   Mental Status: She is alert. Mental status is at baseline.  ?   Motor: No weakness.  ?   Gait: Gait normal.  ?Psychiatric:     ?   Mood and Affect: Mood normal.     ?   Behavior: Behavior normal.     ?   Thought Content: Thought content normal.  ? ? ? ?UC Treatments / Results  ?Labs ?(all labs ordered are listed, but only abnormal results are displayed) ?Labs Reviewed - No data to display ? ?EKG ? ? ?Radiology ?No results found. ? ?Procedures ?Procedures (including critical care time) ? ?Medications Ordered in UC ?Medications - No data to display ? ?Initial Impression / Assessment and Plan / UC Course  ?I have reviewed the triage vital signs and the  nursing notes. ? ?Pertinent labs & imaging results that were available during my care of the patient were reviewed by me and considered in my medical decision making (see chart for details). ? ?86 year old fem

## 2022-01-31 NOTE — ED Triage Notes (Signed)
Pt present rash on left side of the shoulder, there are watery type blister. Pt also been having panic attacks.  ?

## 2022-01-31 NOTE — Discharge Instructions (Signed)
-  The rash does appear to be consistent with shingles.  I have sent antiviral medicine to pharmacy.  Start it as soon as possible.  The recent increased stress in recent illness in your life probably led to the shingles rash. ?- You are contagious to anyone who has not had chickenpox.  Keep your distance from pregnant women, babies and small children. ?-In regards to your recent panic attacks, you can take half a lorazepam every 6-8 hours up to 3 times a day as needed for anxiety attack.  Keep your follow-up appointment with your doctor in the next couple of days.  Speak with your doctor about starting a daily medication to help prevent anxiety attacks. ?- Try to focus on things that calm you down and redirect her mind when you start to feel really anxious. ?- Go to emergency department if you have any symptoms out of the ordinary from your classic panic attacks including significant or not improving chest pain or breathing difficulty. ?

## 2022-02-02 ENCOUNTER — Encounter: Payer: Self-pay | Admitting: Emergency Medicine

## 2022-02-02 ENCOUNTER — Ambulatory Visit
Admission: EM | Admit: 2022-02-02 | Discharge: 2022-02-02 | Disposition: A | Discharge status: Home / Self Care | Payer: Medicare Other | Attending: Internal Medicine | Admitting: Internal Medicine

## 2022-02-02 DIAGNOSIS — I951 Orthostatic hypotension: Secondary | ICD-10-CM | POA: Diagnosis not present

## 2022-02-02 LAB — CBC WITH DIFFERENTIAL/PLATELET
Abs Immature Granulocytes: 0.01 10*3/uL (ref 0.00–0.07)
Basophils Absolute: 0 10*3/uL (ref 0.0–0.1)
Basophils Relative: 1 %
Eosinophils Absolute: 0 10*3/uL (ref 0.0–0.5)
Eosinophils Relative: 1 %
HCT: 41.2 % (ref 36.0–46.0)
Hemoglobin: 14.3 g/dL (ref 12.0–15.0)
Immature Granulocytes: 0 %
Lymphocytes Relative: 21 %
Lymphs Abs: 1.3 10*3/uL (ref 0.7–4.0)
MCH: 31.7 pg (ref 26.0–34.0)
MCHC: 34.7 g/dL (ref 30.0–36.0)
MCV: 91.4 fL (ref 80.0–100.0)
Monocytes Absolute: 0.8 10*3/uL (ref 0.1–1.0)
Monocytes Relative: 12 %
Neutro Abs: 4.1 10*3/uL (ref 1.7–7.7)
Neutrophils Relative %: 65 %
Platelets: 216 10*3/uL (ref 150–400)
RBC: 4.51 MIL/uL (ref 3.87–5.11)
RDW: 12.8 % (ref 11.5–15.5)
WBC: 6.3 10*3/uL (ref 4.0–10.5)
nRBC: 0 % (ref 0.0–0.2)

## 2022-02-02 LAB — COMPREHENSIVE METABOLIC PANEL
ALT: 11 U/L (ref 0–44)
AST: 20 U/L (ref 15–41)
Albumin: 3.9 g/dL (ref 3.5–5.0)
Alkaline Phosphatase: 43 U/L (ref 38–126)
Anion gap: 7 (ref 5–15)
BUN: 13 mg/dL (ref 8–23)
CO2: 22 mmol/L (ref 22–32)
Calcium: 8.8 mg/dL — ABNORMAL LOW (ref 8.9–10.3)
Chloride: 108 mmol/L (ref 98–111)
Creatinine, Ser: 0.84 mg/dL (ref 0.44–1.00)
GFR, Estimated: >60 mL/min (ref >60)
Glucose, Bld: 129 mg/dL — ABNORMAL HIGH (ref 70–99)
Potassium: 4.3 mmol/L (ref 3.5–5.1)
Sodium: 137 mmol/L (ref 135–145)
Total Bilirubin: 0.4 mg/dL (ref 0.3–1.2)
Total Protein: 7.3 g/dL (ref 6.5–8.1)

## 2022-02-02 MED ORDER — SODIUM CHLORIDE 0.9 % IV BOLUS
1000.0000 mL | Freq: Once | INTRAVENOUS | Status: AC
  Administered 2022-02-02: 1000 mL via INTRAVENOUS

## 2022-02-02 NOTE — Discharge Instructions (Addendum)
Please use lorazepam as needed at bedtime only ?Maintain adequate hydration ?Do not get up from a sitting/laying position to a standing position very quickly.  You will benefit from doing it in a staged fashion. ?If your symptoms persist please follow-up with primary care physician. ?

## 2022-02-02 NOTE — ED Triage Notes (Signed)
Pt reports weakness in the middle of the night. States she was in the bathroom trying to get up and legs gave out and this morning when trying to get out of bed her legs gave out again. Denies any obvious injuries and pain. Also reports she was seen here on Saturday and dx with shingles and was instructed to take the prescribed Ativan for increased anxiety attacks. Since then has been feeling weak and more tired and also c/o feeling dizzy.  ?

## 2022-02-02 NOTE — ED Provider Notes (Addendum)
?[place] ? ? ? ?CSN: [id number] ?Arrival date & time: 02/02/22  0930 ? ? ?  ? ?History   ?Chief Complaint ?Chief Complaint  ?Patient presents with  ? Fatigue  ? Fall  ? ? ?HPI ?Kara Dixon is a 86 y.o. female is brought to the urgent care accompanied by her daughter on account of 2 episodes of fall over the past couple of days.  Patient was recently seen in the urgent care and is being treated for shingles.  She also has anxiety and has been taking lorazepam 0.25 mg 2-3 times a day.  She had episodes of falls when she got up from a sitting position and on another occasion from a lying position.  She felt dizzy and lightheaded leading to a fall.  She did not hit her head.  No headaches.  No bruising of the scalp.  No nausea or vomiting at this time.  She has had some nausea episodes once at home.  No fever, chills, cough or sputum production.  Patient recently recovered from COVID-19 infection and since then her oral intake has decreased because of change in taste.  No diarrhea.  No sick contacts.  ? ?HPI ? ?Past Medical History:  ?Diagnosis Date  ? Cancer [facility])   ? skin  ? Hypercholesterolemia   ? Hypertension   ? Nephrolithiasis   ? Osteopenia   ? Vitamin D deficiency   ? ? ?Patient Active Problem List  ? Diagnosis Date Noted  ? Dizziness 12/30/2021  ? Palpitations 12/29/2021  ? Weight loss 12/29/2021  ? Cough 12/29/2021  ? Leukocytosis 10/23/2021  ? Vertigo 10/15/2021  ? Dry skin 04/08/2021  ? Constipation 04/08/2021  ? CKD (chronic kidney disease) stage 3, GFR 30-59 ml/min (HCC) 04/08/2021  ? Stress 08/25/2020  ? Hyperglycemia 08/20/2020  ? Tremor 09/13/2017  ? Pharyngitis 11/22/2015  ? Current use of estrogen therapy 09/24/2015  ? Lumbar pain 02/06/2015  ? Health care maintenance 01/13/2015  ? Skin lesion 01/21/2014  ? Diverticulosis 03/16/2013  ? Type 2 diabetes mellitus with hyperglycemia ([place]) 01/08/2013  ? Vitamin D deficiency 10/17/2012  ? Hypertension 10/16/2012  ? Hypercholesterolemia  10/16/2012  ? Osteopenia 10/16/2012  ? ? ?Past Surgical History:  ?Procedure Laterality Date  ? CATARACT EXTRACTION W/PHACO Right 02/11/2021  ? Procedure: CATARACT EXTRACTION PHACO AND INTRAOCULAR LENS PLACEMENT ([place]) RIGHT;  Surgeon: Birder Robson, MD;  Location: [place];  Service: Ophthalmology;  Laterality: Right;  CDE15.19 ?01:16.1 minutes  ? CATARACT EXTRACTION W/PHACO Left 02/25/2021  ? Procedure: CATARACT EXTRACTION PHACO AND INTRAOCULAR LENS PLACEMENT ([place]) LEFT;  Surgeon: Birder Robson, MD;  Location: [place];  Service: Ophthalmology;  Laterality: Left;  18.25 ?01:23.4  ? cyst removed under tongue  age 63  ? EYE SURGERY  02/2017  ? EYE LID; care everywhere  ? ? ?OB History   ?No obstetric history on file. ?  ? ? ? ?Home Medications   ? ?Prior to Admission medications   ?Medication Sig Start Date End Date Taking? Authorizing Provider  ?amLODipine (NORVASC) 5 MG tablet TAKE 1 TABLET (5 MG TOTAL) BY MOUTH DAILY. 01/26/22   Einar Pheasant, MD  ?aspirin 81 MG tablet Take 81 mg by mouth daily.    [provider]  ?atenolol (TENORMIN) 50 MG tablet One tablet q day 12/29/21   Einar Pheasant, MD  ?estrogens, conjugated, (PREMARIN) 0.3 MG tablet Take 1 tablet (0.3 mg total) by mouth daily. Take daily for 21 days then do not take for  7 days. 09/10/21   Einar Pheasant, MD  ?fexofenadine (ALLEGRA) 180 MG tablet Take 180 mg by mouth as needed.     [provider]  ?lactase (LACTAID) 3000 UNITS tablet Take 1 tablet by mouth as needed.    [provider]  ?LORazepam (ATIVAN) 0.5 MG tablet TAKE 1/2 TAB BY MOUTH AT BEDTIME AS NEEDED 12/01/21   Einar Pheasant, MD  ?progesterone (PROMETRIUM) 100 MG capsule TAKE 1 CAPSULE BY MOUTH EVERY DAY 10/31/21   Einar Pheasant, MD  ?rosuvastatin (CRESTOR) 10 MG tablet Take 1 tablet (10 mg total) by mouth daily. 09/17/21   Einar Pheasant, MD  ?valACYclovir (VALTREX) 1000 MG tablet Take 1 tablet (1,000 mg total) by mouth 3 (three)  times daily for 7 days. 01/31/22 02/07/22  Danton Clap, PA-C  ? ? ?Family History ?Family History  ?Problem Relation Age of Onset  ? Stroke Mother   ? Hypertension Mother   ? Fibromyalgia Sister   ? Breast cancer Neg Hx   ? Colon cancer Neg Hx   ? ? ?Social History ?Social History  ? ?Tobacco Use  ? Smoking status: Never  ? Smokeless tobacco: Never  ?Vaping Use  ? Vaping Use: Never used  ?Substance Use Topics  ? Alcohol use: No  ?  Alcohol/week: 0.0 standard drinks  ? Drug use: No  ? ? ? ?Allergies   ?Avelox [moxifloxacin hcl in nacl], Mucinex [guaifenesin er], Penicillins, Sulfa antibiotics, and Maxitrol [neomycin-polymyxin-dexameth] ? ? ?Review of Systems ?Review of Systems ?As per HPI ? ?Physical Exam ?Triage Vital Signs ?ED Triage Vitals  ?Enc Vitals Group  ?   BP 02/02/22 0947 136/64  ?   Pulse Rate 02/02/22 0947 69  ?   Resp 02/02/22 0947 19  ?   Temp 02/02/22 0947 98.1 ?F (36.7 ?C)  ?   Temp Source 02/02/22 0947 Oral  ?   SpO2 02/02/22 0947 98 %  ?   Weight 02/02/22 0943 115 lb 8.3 oz (52.4 kg)  ?   Height 02/02/22 0943 '5\' 2"'$  (1.575 m)  ?   Head Circumference --   ?   Peak Flow --   ?   Pain Score 02/02/22 0943 0  ?   Pain Loc --   ?   Pain Edu? --   ?   Excl. in [place]? --   ? ?Orthostatic VS for the past 24 hrs: ? BP- Lying Pulse- Lying BP- Sitting Pulse- Sitting BP- Standing at 0 minutes Pulse- Standing at 0 minutes  ?02/02/22 1013 137/67 71 124/64 72 96/63 76  ? ? ?Updated Vital Signs ?BP 136/64 (BP Location: Left Arm)   Pulse 69   Temp 98.1 ?F (36.7 ?C) (Oral)   Resp 19   Ht '5\' 2"'$  (1.575 m)   Wt 52.4 kg   SpO2 98%   BMI 21.13 kg/m?  ? ?Visual Acuity ?Right Eye Distance:   ?Left Eye Distance:   ?Bilateral Distance:   ? ?Right Eye Near:   ?Left Eye Near:    ?Bilateral Near:    ? ?Physical Exam ?Vitals and nursing note reviewed.  ?Constitutional:   ?   General: She is not in acute distress. ?   Appearance: Normal appearance. She is not ill-appearing.  ?   Comments: Elderly female  ?HENT:  ?   Right Ear:  Tympanic membrane normal.  ?   Left Ear: Tympanic membrane normal.  ?Cardiovascular:  ?   Rate and Rhythm: Normal rate and regular rhythm.  ?  Pulses: Normal pulses.  ?   Heart sounds: Normal heart sounds.  ?Pulmonary:  ?   Effort: Pulmonary effort is normal.  ?   Breath sounds: Normal breath sounds.  ?Abdominal:  ?   General: Bowel sounds are normal.  ?   Palpations: Abdomen is soft.  ?Musculoskeletal:     ?   General: Normal range of motion.  ?Skin: ?   Findings: Rash present.  ?Neurological:  ?   Mental Status: She is alert.  ? ? ? ?UC Treatments / Results  ?Labs ?(all labs ordered are listed, but only abnormal results are displayed) ?Labs Reviewed  ?COMPREHENSIVE METABOLIC PANEL  ?CBC WITH DIFFERENTIAL/PLATELET  ? ? ?EKG ? ? ?Radiology ?No results found. ? ?Procedures ?Procedures (including critical care time) ? ?Medications Ordered in UC ?Medications  ?sodium chloride 0.9 % bolus 1,000 mL (has no administration in time range)  ? ? ?Initial Impression / Assessment and Plan / UC Course  ?I have reviewed the triage vital signs and the nursing notes. ? ?Pertinent labs & imaging results that were available during my care of the patient were reviewed by me and considered in my medical decision making (see chart for details). ? ?  ? ?1.  Orthostatic hypotension: ?Orthostatic blood pressures were significant for a 40 mm drop in systolic blood pressure ?CBC, BMP ?Normal saline 1 L bolus-patient feels better after 1 L bolus. ?Orthostatic hypotension precautions given ?We will call patient with recommendations if labs are abnormal ?Return to urgent care if you have any other concerns. ?Final Clinical Impressions(s) / UC Diagnoses  ? ?Final diagnoses:  ?Orthostatic hypotension  ? ? ? ?Discharge Instructions   ? ?  ?Please use lorazepam as needed at bedtime only ?Maintain adequate hydration ?Do not get up from a sitting/laying position to a standing position very quickly.  You will benefit from doing it in a staged  fashion. ?If your symptoms persist please follow-up with primary care physician. ? ? ?ED Prescriptions   ?None ?  ? ?PDMP not reviewed this encounter. ?  ?Chase Picket, MD ?02/02/22 1044 ? ?  ?Chase Picket, MD ?

## 2022-02-04 ENCOUNTER — Ambulatory Visit: Payer: Medicare Other | Admitting: Internal Medicine

## 2022-02-06 ENCOUNTER — Ambulatory Visit (INDEPENDENT_AMBULATORY_CARE_PROVIDER_SITE_OTHER): Payer: Medicare Other | Admitting: Internal Medicine

## 2022-02-06 ENCOUNTER — Encounter: Payer: Self-pay | Admitting: Internal Medicine

## 2022-02-06 DIAGNOSIS — R42 Dizziness and giddiness: Secondary | ICD-10-CM

## 2022-02-06 DIAGNOSIS — F439 Reaction to severe stress, unspecified: Secondary | ICD-10-CM

## 2022-02-06 DIAGNOSIS — N1831 Chronic kidney disease, stage 3a: Secondary | ICD-10-CM

## 2022-02-06 DIAGNOSIS — E1165 Type 2 diabetes mellitus with hyperglycemia: Secondary | ICD-10-CM | POA: Diagnosis not present

## 2022-02-06 DIAGNOSIS — B029 Zoster without complications: Secondary | ICD-10-CM

## 2022-02-06 DIAGNOSIS — I10 Essential (primary) hypertension: Secondary | ICD-10-CM | POA: Diagnosis not present

## 2022-02-06 DIAGNOSIS — Z79899 Other long term (current) drug therapy: Secondary | ICD-10-CM | POA: Diagnosis not present

## 2022-02-06 DIAGNOSIS — E7800 Pure hypercholesterolemia, unspecified: Secondary | ICD-10-CM | POA: Diagnosis not present

## 2022-02-06 DIAGNOSIS — R251 Tremor, unspecified: Secondary | ICD-10-CM

## 2022-02-06 DIAGNOSIS — R634 Abnormal weight loss: Secondary | ICD-10-CM

## 2022-02-06 NOTE — Progress Notes (Addendum)
Patient ID: Kara Dixon, female   DOB: Jan 17, 1936, 86 y.o.   MRN: [id number] ? ? ?Subjective:  ? ? Patient ID: Kara Dixon, female    DOB: 26-May-1936, 86 y.o.   MRN: [id number] ? ?Patient here for a scheduled follow up.  ? ?Chief Complaint  ?Patient presents with  ? Follow-up  ?  1 mo follow up - pt currently being treated for shingles  ? .  ? ?HPI ?She is accompanied by her daughter Loma Sousa).  History obtained from both of them.  Recently evaluated for dizziness.  Was evaluated at Urgent Care 10/08/21 - after waking up to go to bathroom and felt like room was spinning. Position changes worsened.  Associated nausea. In urgent care, EKG - no acute changes. Prescription given for zofran.  Reevaluated 10/15/21 - [name]) - diagnosed vertigo - improved.  Was instructed at that time to decrease atenolol and was given rx for antivert.  She did not take antivert and it appears she decreased amlodipine instead of atenolol.  Given dizziness more positional at that time and had described the room spinning, was referred to ENT for further evaluation.  Has not seen ENT to date. Diagnosed with covid 11/14/21.  Has continued to not feel well.  Decreased po intake.  Trouble swallowing.  Feels like mucus in her throat.  Sweets - worse.  Some occasional regurgitation of food.  No fever.  No chest pain.  Breathing overall stable.  No abdominal pain.  Bowels moving.  Persistent dizziness.  Worsening hand tremors.  Increased anxiety recently.  Weight is down.  Recently evaluated and diagnosed with shingles.  Rash - left upper shoulder/left lateral neck and up to angle of jaw.  Placed on valtrex. Completes tomorrow.  Was also reevaluated at [facility] after two falls.  Was found to be orthostatic.  Given IVFs.  No further falls.  No syncope or near syncope.  Did not hit her head.  No residual pain from fall.   ? ? ?Past Medical History:  ?Diagnosis Date  ? Cancer [facility])   ? skin  ? Hypercholesterolemia   ? Hypertension   ? Nephrolithiasis    ? Osteopenia   ? Vitamin D deficiency   ? ?Past Surgical History:  ?Procedure Laterality Date  ? CATARACT EXTRACTION W/PHACO Right 02/11/2021  ? Procedure: CATARACT EXTRACTION PHACO AND INTRAOCULAR LENS PLACEMENT ([place]) RIGHT;  Surgeon: Birder Robson, MD;  Location: [place];  Service: Ophthalmology;  Laterality: Right;  CDE15.19 ?01:16.1 minutes  ? CATARACT EXTRACTION W/PHACO Left 02/25/2021  ? Procedure: CATARACT EXTRACTION PHACO AND INTRAOCULAR LENS PLACEMENT ([place]) LEFT;  Surgeon: Birder Robson, MD;  Location: [place];  Service: Ophthalmology;  Laterality: Left;  18.25 ?01:23.4  ? cyst removed under tongue  age 55  ? EYE SURGERY  02/2017  ? EYE LID; care everywhere  ? ?Family History  ?Problem Relation Age of Onset  ? Stroke Mother   ? Hypertension Mother   ? Fibromyalgia Sister   ? Breast cancer Neg Hx   ? Colon cancer Neg Hx   ? ?Social History  ? ?Socioeconomic History  ? Marital status: Married  ?  Spouse name: Not on file  ? Number of children: 1  ? Years of education: Not on file  ? Highest education level: Not on file  ?Occupational History  ? Not on file  ?Tobacco Use  ? Smoking status: Never  ? Smokeless tobacco: Never  ?Vaping Use  ? Vaping Use: Never used  ?Substance  and Sexual Activity  ? Alcohol use: No  ?  Alcohol/week: 0.0 standard drinks  ? Drug use: No  ? Sexual activity: Not Currently  ?Other Topics Concern  ? Not on file  ?Social History Narrative  ? Not on file  ? ?Social Determinants of Health  ? ?Financial Resource Strain: Low Risk   ? Difficulty of Paying Living Expenses: Not hard at all  ?Food Insecurity: No Food Insecurity  ? Worried About Charity fundraiser in the Last Year: Never true  ? Ran Out of Food in the Last Year: Never true  ?Transportation Needs: No Transportation Needs  ? Lack of Transportation (Medical): No  ? Lack of Transportation (Non-Medical): No  ?Physical Activity: Unknown  ? Days of Exercise per Week: 0 days  ? Minutes of Exercise per  Session: Not on file  ?Stress: No Stress Concern Present  ? Feeling of Stress : Not at all  ?Social Connections: Unknown  ? Frequency of Communication with Friends and Family: More than three times a week  ? Frequency of Social Gatherings with Friends and Family: Not on file  ? Attends Religious Services: Not on file  ? Active Member of Clubs or Organizations: Not on file  ? Attends Archivist Meetings: Not on file  ? Marital Status: Married  ? ? ? ?Review of Systems  ?Constitutional:   ?     Decreased po intake.  Is drinking ensure now.  Decreased weight.   ?HENT:  Negative for congestion and sinus pressure.   ?Respiratory:  Negative for cough, chest tightness and shortness of breath.   ?Cardiovascular:  Negative for chest pain, palpitations and leg swelling.  ?Gastrointestinal:  Negative for abdominal pain, diarrhea, nausea and vomiting.  ?     Trouble swallowing as outlined.  Some regurgitation.   ?Genitourinary:  Negative for difficulty urinating and dysuria.  ?Musculoskeletal:  Negative for joint swelling and myalgias.  ?Skin:   ?     Erythematous rash - as outlined.    ?Neurological:  Positive for dizziness and light-headedness. Negative for headaches.  ?Psychiatric/Behavioral:    ?     Increased stress and anxiety as outlined.   ? ?   ?Objective:  ?  ? ?BP 136/80 (BP Location: Left Arm, Patient Position: Sitting, Cuff Size: Small)   Pulse 68   Temp (!) 97.4 ?F (36.3 ?C) (Temporal)   Resp 14   Ht '5\' 2"'$  (1.575 m)   Wt 110 lb (49.9 kg)   SpO2 96%   BMI 20.12 kg/m?  ?Wt Readings from Last 3 Encounters:  ?02/06/22 110 lb (49.9 kg)  ?02/02/22 115 lb 8.3 oz (52.4 kg)  ?12/29/21 115 lb 8 oz (52.4 kg)  ? ? ?Physical Exam ?Vitals reviewed.  ?Constitutional:   ?   General: She is not in acute distress. ?   Appearance: Normal appearance.  ?HENT:  ?   Head: Normocephalic and atraumatic.  ?   Right Ear: External ear normal.  ?   Left Ear: External ear normal.  ?Eyes:  ?   General: No scleral icterus.    ?    Right eye: No discharge.     ?   Left eye: No discharge.  ?   Conjunctiva/sclera: Conjunctivae normal.  ?Neck:  ?   Thyroid: No thyromegaly.  ?Cardiovascular:  ?   Rate and Rhythm: Normal rate and regular rhythm.  ?Pulmonary:  ?   Effort: No respiratory distress.  ?   Breath sounds: Normal  breath sounds. No wheezing.  ?Abdominal:  ?   General: Bowel sounds are normal.  ?   Palpations: Abdomen is soft.  ?   Tenderness: There is no abdominal tenderness.  ?Musculoskeletal:     ?   General: No swelling or tenderness.  ?   Cervical back: Neck supple. No tenderness.  ?Lymphadenopathy:  ?   Cervical: No cervical adenopathy.  ?Skin: ?   Comments: Erythematous based rash - lesions - appears to be c/w shingles.  Rash - left upper shoulder/left lateral neck and up to angle of jaw.    ?Neurological:  ?   Mental Status: She is alert.  ?Psychiatric:     ?   Mood and Affect: Mood normal.     ?   Behavior: Behavior normal.  ? ? ? ?Outpatient Encounter Medications as of 02/06/2022  ?Medication Sig  ? amLODipine (NORVASC) 5 MG tablet TAKE 1 TABLET (5 MG TOTAL) BY MOUTH DAILY.  ? aspirin 81 MG tablet Take 81 mg by mouth daily.  ? atenolol (TENORMIN) 50 MG tablet One tablet q day  ? estrogens, conjugated, (PREMARIN) 0.3 MG tablet Take 1 tablet (0.3 mg total) by mouth daily. Take daily for 21 days then do not take for 7 days.  ? fexofenadine (ALLEGRA) 180 MG tablet Take 180 mg by mouth as needed.   ? lactase (LACTAID) 3000 UNITS tablet Take 1 tablet by mouth as needed.  ? LORazepam (ATIVAN) 0.5 MG tablet TAKE 1/2 TAB BY MOUTH AT BEDTIME AS NEEDED  ? progesterone (PROMETRIUM) 100 MG capsule TAKE 1 CAPSULE BY MOUTH EVERY DAY  ? rosuvastatin (CRESTOR) 10 MG tablet Take 1 tablet (10 mg total) by mouth daily.  ? sertraline (ZOLOFT) 50 MG tablet Take 1/2 tablet daily x 10 days and then increase to one po q day  ? [EXPIRED] valACYclovir (VALTREX) 1000 MG tablet Take 1 tablet (1,000 mg total) by mouth 3 (three) times daily for 7 days.  ? ?No  facility-administered encounter medications on file as of 02/06/2022.  ?  ? ?Lab Results  ?Component Value Date  ? WBC 6.3 02/02/2022  ? HGB 14.3 02/02/2022  ? HCT 41.2 02/02/2022  ? PLT 216 02/02/2022  ? GLUCOSE 1

## 2022-02-07 ENCOUNTER — Encounter: Payer: Self-pay | Admitting: Internal Medicine

## 2022-02-07 ENCOUNTER — Telehealth: Payer: Self-pay | Admitting: Internal Medicine

## 2022-02-07 DIAGNOSIS — B029 Zoster without complications: Secondary | ICD-10-CM | POA: Insufficient documentation

## 2022-02-07 NOTE — Telephone Encounter (Signed)
I had placed an ENT referral in January.  She reports still has not been scheduled an appt.  She is having persistent dizziness and is now with shingles and - near her ear.  Would like ENT to evaluate. Can you help with getting her an appt.  Thanks   ?

## 2022-02-07 NOTE — Assessment & Plan Note (Signed)
Reports decreased appetite.  Has lost weight.  Follow met b and a1c.  ?

## 2022-02-07 NOTE — Assessment & Plan Note (Signed)
On crestor. Follow lipid panel and liver function tests.   ?Lab Results  ?Component Value Date  ? CHOL 104 12/29/2021  ? HDL 45.40 12/29/2021  ? LDLCALC 37 12/29/2021  ? LDLDIRECT 139.4 05/12/2013  ? TRIG 107.0 12/29/2021  ? CHOLHDL 2 12/29/2021  ? ?

## 2022-02-07 NOTE — Assessment & Plan Note (Signed)
Persistent weight loss as outlined.  Discussed.  Discussed further w/up and evaluation.  Will pursue neurology w/up first.  She is drinking ensure.  Follow.   ?

## 2022-02-07 NOTE — Assessment & Plan Note (Signed)
Diagnosed with shingles as outlined.  Completing valtrex.  Given location of shingles, f/u with ENT as planned.  Also overdue eye exam.  Will have ophthalmology evaluate.   ?

## 2022-02-07 NOTE — Assessment & Plan Note (Signed)
Increased stress and anxiety as outlined.  Discussed treatment.  Try to get her off lorazepam.  Start zoloft as directed.  Follow.  ?

## 2022-02-07 NOTE — Assessment & Plan Note (Signed)
Discussed today - desire to get her off estrogen.  Have tried to taper off previously.  Understands risks and possible side effects of estrogen therapy.  Has decreased frequency taking.  Follow.  ?

## 2022-02-07 NOTE — Assessment & Plan Note (Signed)
Need to clarify what medication she is taking.  Her daughter is going to check and let us know.  Currently should be on atenolol q day and amlodipine '5mg'$  q day.  May need to decrease further.  Follow pressures.   ?

## 2022-02-07 NOTE — Assessment & Plan Note (Signed)
Avoid antiinflammatories.  Stay hydrated.  Follow metabolic panel.   

## 2022-02-07 NOTE — Assessment & Plan Note (Addendum)
Recently evaluated for dizziness.  Was evaluated at Urgent Care 10/08/21 - after waking up to go to bathroom and felt like room was spinning. Position changes worsened.  Associated nausea.  In urgent care, EKG - no acute changes. Prescription given for zofran.  Reevaluated 10/15/21 - Kara Dixon) - diagnosed vertigo - improved. Was instructed at that time to decrease atenolol and was given rx for antivert.  She did not take antivert and it appears she decreased amlodipine instead of atenolol.  Given dizziness more positional at that time and had described the room spinning, was referred to ENT for further evaluation. Has not seen ENT to date.  Comes in today with persistent light headedness/dizziness.  Daughter will clarify medication currently taking.   Discussed MRI.  Had previously declined.  Agreeable.  F/u ENT evaluation and discussed neurology referral.  Treat anxiety. Decrease lorazepam.  Not taking regularly.  Given dizziness and unsteadiness - will have PT evaluate and treat.  ?

## 2022-02-07 NOTE — Assessment & Plan Note (Addendum)
Described worsening tremor.  Discussed further neurology w/up and evaluation.  She is in agreement. Given tremor, will have OT evaluate and treat.   ?

## 2022-02-08 ENCOUNTER — Other Ambulatory Visit: Payer: Self-pay | Admitting: Internal Medicine

## 2022-02-09 MED ORDER — SERTRALINE HCL 50 MG PO TABS
ORAL_TABLET | ORAL | 1 refills | Status: DC
Start: 2022-02-09 — End: 2022-03-04

## 2022-02-09 NOTE — Addendum Note (Signed)
Addended by: Alisa Graff on: 02/09/2022 12:29 PM ? ? Modules accepted: Orders ? ?

## 2022-02-10 NOTE — Telephone Encounter (Signed)
Please confirm if pt still needs refill.  We had discuss trying to get her off the medication.  Please confirm with daughter Loma Sousa.  ?

## 2022-02-11 NOTE — Telephone Encounter (Signed)
S/w Loma Sousa - pt is not using Lorazepam any longer. ?Rx denied. ?

## 2022-02-14 ENCOUNTER — Other Ambulatory Visit: Payer: Self-pay

## 2022-02-14 ENCOUNTER — Ambulatory Visit
Admission: EM | Admit: 2022-02-14 | Discharge: 2022-02-14 | Disposition: A | Discharge status: Home / Self Care | Payer: Medicare Other | Attending: Internal Medicine | Admitting: Internal Medicine

## 2022-02-14 DIAGNOSIS — F411 Generalized anxiety disorder: Secondary | ICD-10-CM

## 2022-02-14 MED ORDER — LORAZEPAM 0.5 MG PO TABS: ORAL_TABLET | ORAL | 0 refills | Status: DC

## 2022-02-14 MED ORDER — ONDANSETRON 4 MG PO TBDP: 4.0000 mg | ORAL_TABLET | Freq: Three times a day (TID) | ORAL | 0 refills | Status: AC | PRN

## 2022-02-14 MED ORDER — LORAZEPAM 0.5 MG PO TABS
ORAL_TABLET | ORAL | 0 refills | Status: DC
Start: 2022-02-14 — End: 2022-03-11

## 2022-02-14 NOTE — Discharge Instructions (Addendum)
Breath in a paper bag when the symptoms of anxiety and panic come up on you. Do this for 3-5 minutes ?Take deep slow breaths from your nose and out from your nose. Do this for 5 minutes.  ?Call your primary care Doctor Monday, and see if they can see you sooner ?Take the Xanax during the day if you need it, as you used to before.  ?

## 2022-02-14 NOTE — ED Provider Notes (Addendum)
?[place] ? ? ? ?CSN: [id number] ?Arrival date & time: 02/14/22  0808 ? ? ?  ? ?History   ?Chief Complaint ?No chief complaint on file. ? ? ?HPI ?Kara Dixon is a 86 y.o. female who is here with her neighbor and who presents due to having  anxiety and shaking that happens out of the blue and denies having any seriuos worries, but is concerne of not eating or drinking much due to feeling nausea to just see food. Denies abdominal pain or constipation. Has Been on Zoloft 25 mg x 7 days and does not have a FU for 6 weeks. She is out of her Xanax which she used to take prn, and was changed to qhs.  ?She has normal labs in March, including her thyroid.  ? ? ? ?Past Medical History:  ?Diagnosis Date  ? Cancer [facility])   ? skin  ? Hypercholesterolemia   ? Hypertension   ? Nephrolithiasis   ? Osteopenia   ? Vitamin D deficiency   ? ? ?Patient Active Problem List  ? Diagnosis Date Noted  ? Shingles 02/07/2022  ? Dizziness 12/30/2021  ? Palpitations 12/29/2021  ? Weight loss 12/29/2021  ? Cough 12/29/2021  ? Leukocytosis 10/23/2021  ? Vertigo 10/15/2021  ? Dry skin 04/08/2021  ? Constipation 04/08/2021  ? CKD (chronic kidney disease) stage 3, GFR 30-59 ml/min (HCC) 04/08/2021  ? Stress 08/25/2020  ? Hyperglycemia 08/20/2020  ? Tremor 09/13/2017  ? Pharyngitis 11/22/2015  ? Current use of estrogen therapy 09/24/2015  ? Lumbar pain 02/06/2015  ? Health care maintenance 01/13/2015  ? Skin lesion 01/21/2014  ? Diverticulosis 03/16/2013  ? Type 2 diabetes mellitus with hyperglycemia ([place]) 01/08/2013  ? Vitamin D deficiency 10/17/2012  ? Hypertension 10/16/2012  ? Hypercholesterolemia 10/16/2012  ? Osteopenia 10/16/2012  ? ? ?Past Surgical History:  ?Procedure Laterality Date  ? CATARACT EXTRACTION W/PHACO Right 02/11/2021  ? Procedure: CATARACT EXTRACTION PHACO AND INTRAOCULAR LENS PLACEMENT ([place]) RIGHT;  Surgeon: Birder Robson, MD;  Location: [place];  Service: Ophthalmology;  Laterality: Right;   CDE15.19 ?01:16.1 minutes  ? CATARACT EXTRACTION W/PHACO Left 02/25/2021  ? Procedure: CATARACT EXTRACTION PHACO AND INTRAOCULAR LENS PLACEMENT ([place]) LEFT;  Surgeon: Birder Robson, MD;  Location: [place];  Service: Ophthalmology;  Laterality: Left;  18.25 ?01:23.4  ? cyst removed under tongue  age 45  ? EYE SURGERY  02/2017  ? EYE LID; care everywhere  ? ? ?OB History   ?No obstetric history on file. ?  ? ? ? ?Home Medications   ? ?Prior to Admission medications   ?Medication Sig Start Date End Date Taking? Authorizing Provider  ?amLODipine (NORVASC) 5 MG tablet TAKE 1 TABLET (5 MG TOTAL) BY MOUTH DAILY. 01/26/22  Yes Einar Pheasant, MD  ?aspirin 81 MG tablet Take 81 mg by mouth daily.   Yes [provider]  ?atenolol (TENORMIN) 50 MG tablet One tablet q day 12/29/21  Yes Einar Pheasant, MD  ?estrogens, conjugated, (PREMARIN) 0.3 MG tablet Take 1 tablet (0.3 mg total) by mouth daily. Take daily for 21 days then do not take for 7 days. 09/10/21  Yes Einar Pheasant, MD  ?fexofenadine (ALLEGRA) 180 MG tablet Take 180 mg by mouth as needed.    Yes [provider]  ?lactase (LACTAID) 3000 UNITS tablet Take 1 tablet by mouth as needed.   Yes [provider]  ?ondansetron (ZOFRAN-ODT) 4 MG disintegrating tablet Take 1 tablet (4 mg total) by mouth every 8 (  eight) hours as needed for nausea or vomiting. 02/14/22  Yes Rodriguez-Southworth, Sunday Spillers, PA-C  ?progesterone (PROMETRIUM) 100 MG capsule TAKE 1 CAPSULE BY MOUTH EVERY DAY 10/31/21  Yes Einar Pheasant, MD  ?rosuvastatin (CRESTOR) 10 MG tablet Take 1 tablet (10 mg total) by mouth daily. 09/17/21  Yes Einar Pheasant, MD  ?sertraline (ZOLOFT) 50 MG tablet Take 1/2 tablet daily x 10 days and then increase to one po q day 02/09/22  Yes Einar Pheasant, MD  ?LORazepam (ATIVAN) 0.5 MG tablet TAKE 1/2 TAB BY MOUTH AT BEDTIME AS NEEDED,but may use 1/2  up to tid prn for anxiety and panic 02/14/22   Rodriguez-Southworth, Sunday Spillers, PA-C   ? ? ?Family History ?Family History  ?Problem Relation Age of Onset  ? Stroke Mother   ? Hypertension Mother   ? Fibromyalgia Sister   ? Breast cancer Neg Hx   ? Colon cancer Neg Hx   ? ? ?Social History ?Social History  ? ?Tobacco Use  ? Smoking status: Never  ? Smokeless tobacco: Never  ?Vaping Use  ? Vaping Use: Never used  ?Substance Use Topics  ? Alcohol use: No  ?  Alcohol/week: 0.0 standard drinks  ? Drug use: No  ? ? ? ?Allergies   ?Avelox [moxifloxacin hcl in nacl], Mucinex [guaifenesin er], Penicillins, Sulfa antibiotics, and Maxitrol [neomycin-polymyxin-dexameth] ? ? ?Review of Systems ?Review of Systems  ?Constitutional:   ?     + wt loss. Was around 130 lb and been loosing wt since 09/2021 ?Had covid 10/2021 and still cant taste or smell as before  ?Gastrointestinal:  Negative for abdominal pain.  ? ? ?Physical Exam ?Triage Vital Signs ?ED Triage Vitals  ?Enc Vitals Group  ?   BP 02/14/22 0827 122/69  ?   Pulse Rate 02/14/22 0827 60  ?   Resp 02/14/22 0827 20  ?   Temp 02/14/22 0827 97.7 ?F (36.5 ?C)  ?   Temp Source 02/14/22 0827 Oral  ?   SpO2 02/14/22 0827 96 %  ?   Weight 02/14/22 0822 110 lb (49.9 kg)  ?   Height 02/14/22 0822 5' (1.524 m)  ?   Head Circumference --   ?   Peak Flow --   ?   Pain Score 02/14/22 0822 4  ?   Pain Loc --   ?   Pain Edu? --   ?   Excl. in [place]? --   ? ?No data found. ? ?Updated Vital Signs ?BP 122/69 (BP Location: Right Arm)   Pulse 60   Temp 97.7 ?F (36.5 ?C) (Oral)   Resp 20   Ht 5' (1.524 m)   Wt 107 lb 9.6 oz (48.8 kg)   SpO2 96%   BMI 21.01 kg/m?  ? ?Visual Acuity ?Right Eye Distance:   ?Left Eye Distance:   ?Bilateral Distance:   ? ?Right Eye Near:   ?Left Eye Near:    ?Bilateral Near:    ? ? ?Physical Exam ?Vitals signs and nursing note reviewed.  ?Constitutional:   ?   General: She is not in acute distress. ?   Appearance: Normal appearance. She is not ill-appearing, toxic-appearing or diaphoretic.  ?HENT:  ?   Head: Normocephalic.  ?   Right Ear: Tympanic  membrane, ear canal and external ear normal.  ?   Left Ear: Tympanic membrane, ear canal and external ear normal.  ?   Nose: Nose normal.  ?   Mouth/Throat:  ?   Mouth: Mucous membranes  are moist.  ?Eyes:  ?   General: No scleral icterus.    ?   Right eye: No discharge.     ?   Left eye: No discharge.  ?   Conjunctiva/sclera: Conjunctivae normal.  ?Neck:  ?   Musculoskeletal: Neck supple. No neck rigidity.  ?Cardiovascular:  ?   Rate and Rhythm: Normal rate and regular rhythm.  ?   Heart sounds: No murmur.  ?Pulmonary:  ?   Effort: Pulmonary effort is normal.  ?   Breath sounds: Normal breath sounds.  ?Abdominal:  ?   General: Bowel sounds are normal. There is no distension.  ?   Palpations: Abdomen is soft. There is no mass.  ?   Tenderness: There is no abdominal tenderness. There is no guarding or rebound.  ?   Hernia: No hernia is present.  ?Musculoskeletal: Normal range of motion.  ?Lymphadenopathy:  ?   Cervical: No cervical adenopathy.  ?Skin: ?   General: Skin is warm and dry.  ?   Coloration: Skin is not jaundiced.  ?   Findings: No rash.  ?Neurological: no tremor present ?   Mental Status: She is alert and oriented to person, place, and time.  ?   Gait: Gait normal.  ?Psychiatric:     ?   Mood and Affect: Mood normal.     ?   Behavior: Behavior normal.     ?   Thought Content: Thought content normal.     ?   Judgment: Judgment normal.  ? ? ?UC Treatments / Results  ?Labs ?(all labs ordered are listed, but only abnormal results are displayed) ?Labs Reviewed - No data to display ? ?EKG ? ? ?Radiology ?No results found. ? ?Procedures ?Procedures (including critical care time) ? ?Medications Ordered in UC ?Medications - No data to display ? ?Initial Impression / Assessment and Plan / UC Course  ?I have reviewed the triage vital signs and the nursing notes. ? ?I reviewed the labs she had in March, and are in normal range. ?I prescribed her Zofran as noted to see if this will help her nausea and see if she can  eat better.  ?I gave her a few Xanax until she can talk with her PCP. I explained, that will take 2-3 weeks for the Zoloft to kick in to help the anxiety, so I cant bump her dose right now since she has only been on this one

## 2022-02-14 NOTE — ED Triage Notes (Signed)
Patient states she has shingles -- She is being treated for these. She is having a lot of anxiety.  ? ?She was given Sertraline '50mg'$  for her anxiety.  ? ?This AM she was having a panic attack.  ?

## 2022-02-16 ENCOUNTER — Telehealth: Payer: Self-pay | Admitting: Internal Medicine

## 2022-02-16 NOTE — Telephone Encounter (Signed)
Delynn Flavin ?to P [facility] Clinical Pool (supporting Einar Pheasant, MD)   ? 9:38 PM ?Dr Nicki Reaper, ?I am currently out of town , but Kara Dixon was seen at [facility] urgent care Saturday (5/13). She was having panic attacks & couldn?t eat.  The Dr told her to take lorazepam at night until she could be seen by you. They also gave her zofran to take at meals. She has lost another few pounds.  ?At this point I am concerned about dehydration and a possible UTI since her mental & physical state is continuing to rapidly decline. (She told the urgent care dr that she hadn?t received treatment for shingles even though she had been seen 3 times & taken the valtrex prescription.) ?I will be out of town until next Sunday but her neighbor can bring her to an appt this week if you feel she needs to be seen.  You can also call me anytime [phone]. Thank you so much, Kara Dixon  ?

## 2022-02-16 NOTE — Telephone Encounter (Signed)
Called multiple times left voicemail call continues to go directly to voicemail. ?

## 2022-02-16 NOTE — Telephone Encounter (Signed)
Einar Pheasant, MD ?to Me    ?77 PM ?Note ?I reviewed urgent care note and they were concerned regarding anxiety.  Gave her lorazepam to use until zoloft takes affect.  If acute change or problems, needs to be evaluated.  I am ok if sees someone else if this allows her to be seen sooner.  This information is regarding Ms Jarah Pember - Courtney's mother - needs to go in her chart for documentation purposes    ?  ? ?

## 2022-02-17 ENCOUNTER — Telehealth: Payer: Self-pay | Admitting: Internal Medicine

## 2022-02-17 NOTE — Telephone Encounter (Signed)
Tried to reach patient left  voicemail to please call office tried cell and home phone. ?

## 2022-02-17 NOTE — Telephone Encounter (Signed)
Lft pt vm to call ofc . thanks 

## 2022-02-17 NOTE — Telephone Encounter (Signed)
Concerns weaned off lorazepam  from visit started the new medication Zoloft patient is not sleeping and having panic attacks , ED proscribed lorazepam until patient could see provider. ED also gave nausea medication Zofran patient complaint of nausea concerned patient has lost another pound and , family wondering if she may have UTI reason for increases in confusion. Patient coming in on Thursday will need assistance in collecting urine neighbor will be bringing patient and ask could patient sign DPR then will add to appt note. ?

## 2022-02-17 NOTE — Telephone Encounter (Signed)
Can we at least call the daughter to inform of appt time and get information.  We will not be giving any personal information.   ?

## 2022-02-19 ENCOUNTER — Telehealth: Payer: Self-pay

## 2022-02-19 ENCOUNTER — Encounter: Payer: Self-pay | Admitting: Internal Medicine

## 2022-02-19 ENCOUNTER — Telehealth: Payer: Self-pay | Admitting: *Deleted

## 2022-02-19 ENCOUNTER — Ambulatory Visit (INDEPENDENT_AMBULATORY_CARE_PROVIDER_SITE_OTHER): Payer: Medicare Other | Admitting: Internal Medicine

## 2022-02-19 VITALS — BP 109/64 | HR 60 | Temp 98.2°F | Resp 12 | Ht 60.0 in | Wt 108.2 lb

## 2022-02-19 DIAGNOSIS — R42 Dizziness and giddiness: Secondary | ICD-10-CM

## 2022-02-19 DIAGNOSIS — R413 Other amnesia: Secondary | ICD-10-CM | POA: Diagnosis not present

## 2022-02-19 DIAGNOSIS — E7800 Pure hypercholesterolemia, unspecified: Secondary | ICD-10-CM | POA: Diagnosis not present

## 2022-02-19 DIAGNOSIS — R251 Tremor, unspecified: Secondary | ICD-10-CM | POA: Diagnosis not present

## 2022-02-19 DIAGNOSIS — I10 Essential (primary) hypertension: Secondary | ICD-10-CM | POA: Diagnosis not present

## 2022-02-19 DIAGNOSIS — N1831 Chronic kidney disease, stage 3a: Secondary | ICD-10-CM

## 2022-02-19 DIAGNOSIS — E1165 Type 2 diabetes mellitus with hyperglycemia: Secondary | ICD-10-CM | POA: Diagnosis not present

## 2022-02-19 DIAGNOSIS — R634 Abnormal weight loss: Secondary | ICD-10-CM

## 2022-02-19 LAB — POCT URINALYSIS DIPSTICK
Bilirubin, UA: NEGATIVE
Blood, UA: NEGATIVE
Glucose, UA: NEGATIVE
Leukocytes, UA: NEGATIVE
Nitrite, UA: NEGATIVE
Protein, UA: NEGATIVE
Spec Grav, UA: 1.025 (ref 1.010–1.025)
Urobilinogen, UA: 1 E.U./dL
pH, UA: 5 (ref 5.0–8.0)

## 2022-02-19 NOTE — Progress Notes (Signed)
Patient ID: Kara Dixon, female   DOB: [birth year], 86 y.o.   MRN: [id number]   Subjective:    Patient ID: Kara Dixon, female    DOB: 07/11/1936, 86 y.o.   MRN: [id number]   Patient here for work in appt.   Chief Complaint  Patient presents with   Follow-up    F/u possible UTI   .   HPI She is accompanied by her neighbor.  History obtained from both of them.  Was seen at Urgent Care 02/14/22 - nausea and increased anxiety.  Was prescribed zofran.  Also given lorazepam.  Daughter concerned regarding dehydration and a possible UTI.  She has recently been diagnosed with shingles as well.  Has been having increased pain from this.  Is better today.  She is not eating.  Drinking ensure.  Taking zofran.  States her mouth has a sticky film.  Sugar aggravates.  Some trouble swallowing.  Smell of food can make her queezy.  No vomiting.  No chest pain.  Breathing stable.  No increased cough or congestion.  Some dizziness and tremors.  Appetite change and she feels progression of symptoms started after having covid.  Still not sure what medication she is taking.  Neighbor is going to check when she gets home and clarify for [place].     Past Medical History:  Diagnosis Date   Cancer ([place])    skin   Hypercholesterolemia    Hypertension    Nephrolithiasis    Osteopenia    Vitamin D deficiency    Past Surgical History:  Procedure Laterality Date   CATARACT EXTRACTION W/PHACO Right 02/11/2021   Procedure: CATARACT EXTRACTION PHACO AND INTRAOCULAR LENS PLACEMENT ([place]) RIGHT;  Surgeon: Birder Robson, MD;  Location: [place];  Service: Ophthalmology;  Laterality: Right;  CDE15.19 01:16.1 minutes   CATARACT EXTRACTION W/PHACO Left 02/25/2021   Procedure: CATARACT EXTRACTION PHACO AND INTRAOCULAR LENS PLACEMENT ([place]) LEFT;  Surgeon: Birder Robson, MD;  Location: [place];  Service: Ophthalmology;  Laterality: Left;  18.25 01:23.4   cyst removed under tongue  age 18   EYE  SURGERY  02/2017   EYE LID; care everywhere   Family History  Problem Relation Age of Onset   Stroke Mother    Hypertension Mother    Fibromyalgia Sister    Breast cancer Neg Hx    Colon cancer Neg Hx    Social History   Socioeconomic History   Marital status: Married    Spouse name: Not on file   Number of children: 1   Years of education: Not on file   Highest education level: Not on file  Occupational History   Not on file  Tobacco Use   Smoking status: Never   Smokeless tobacco: Never  Vaping Use   Vaping Use: Never used  Substance and Sexual Activity   Alcohol use: No    Alcohol/week: 0.0 standard drinks   Drug use: No   Sexual activity: Not Currently  Other Topics Concern   Not on file  Social History Narrative   Not on file   Social Determinants of Health   Financial Resource Strain: Low Risk    Difficulty of Paying Living Expenses: Not hard at all  Food Insecurity: No Food Insecurity   Worried About Charity fundraiser in the Last Year: Never true   [place] in the Last Year: Never true  Transportation Needs: No Transportation Needs   Lack of Transportation (Medical): No  Lack of Transportation (Non-Medical): No  Physical Activity: Unknown   Days of Exercise per Week: 0 days   Minutes of Exercise per Session: Not on file  Stress: No Stress Concern Present   Feeling of Stress : Not at all  Social Connections: Unknown   Frequency of Communication with Friends and Family: More than three times a week   Frequency of Social Gatherings with Friends and Family: Not on file   Attends Religious Services: Not on file   Active Member of Clubs or Organizations: Not on file   Attends Archivist Meetings: Not on file   Marital Status: Married     Review of Systems  Constitutional:  Positive for appetite change.       Has lost weight.   HENT:  Negative for congestion and sinus pressure.   Respiratory:  Negative for cough, chest tightness and  shortness of breath.   Cardiovascular:  Negative for chest pain, palpitations and leg swelling.  Gastrointestinal:  Positive for nausea. Negative for abdominal pain and vomiting.  Genitourinary:  Negative for difficulty urinating and dysuria.  Musculoskeletal:  Negative for joint swelling and myalgias.       Pain from shingles is better.   Skin:  Negative for color change and rash.  Neurological:  Positive for dizziness and light-headedness. Negative for headaches.  Psychiatric/Behavioral:  Negative for agitation and dysphoric mood.       Objective:     BP 109/64 (BP Location: Left Arm, Patient Position: Sitting, Cuff Size: Small)   Pulse 60   Temp 98.2 F (36.8 C) (Temporal)   Resp 12   Ht 5' (1.524 m)   Wt 108 lb 3.2 oz (49.1 kg)   SpO2 96%   BMI 21.13 kg/m  Wt Readings from Last 3 Encounters:  02/19/22 108 lb 3.2 oz (49.1 kg)  02/14/22 107 lb 9.6 oz (48.8 kg)  02/06/22 110 lb (49.9 kg)    Physical Exam Vitals reviewed.  Constitutional:      General: She is not in acute distress.    Appearance: Normal appearance.  HENT:     Head: Normocephalic and atraumatic.     Right Ear: External ear normal.     Left Ear: External ear normal.  Eyes:     General: No scleral icterus.       Right eye: No discharge.        Left eye: No discharge.     Conjunctiva/sclera: Conjunctivae normal.  Neck:     Thyroid: No thyromegaly.  Cardiovascular:     Rate and Rhythm: Normal rate and regular rhythm.  Pulmonary:     Effort: No respiratory distress.     Breath sounds: Normal breath sounds. No wheezing.  Abdominal:     General: Bowel sounds are normal.     Palpations: Abdomen is soft.     Tenderness: There is no abdominal tenderness.  Musculoskeletal:        General: No swelling or tenderness.     Cervical back: Neck supple. No tenderness.  Lymphadenopathy:     Cervical: No cervical adenopathy.  Skin:    Findings: No erythema or rash.  Neurological:     Mental Status: She is  alert.  Psychiatric:        Mood and Affect: Mood normal.        Behavior: Behavior normal.     Outpatient Encounter Medications as of 02/19/2022  Medication Sig   amLODipine (NORVASC) 5 MG tablet TAKE 1 TABLET (  5 MG TOTAL) BY MOUTH DAILY.   aspirin 81 MG tablet Take 81 mg by mouth daily.   atenolol (TENORMIN) 50 MG tablet One tablet q day   estrogens, conjugated, (PREMARIN) 0.3 MG tablet Take 1 tablet (0.3 mg total) by mouth daily. Take daily for 21 days then do not take for 7 days.   fexofenadine (ALLEGRA) 180 MG tablet Take 180 mg by mouth as needed.    lactase (LACTAID) 3000 UNITS tablet Take 1 tablet by mouth as needed.   LORazepam (ATIVAN) 0.5 MG tablet TAKE 1/2 TAB BY MOUTH AT BEDTIME AS NEEDED,but may use 1/2  up to tid prn for anxiety and panic   ondansetron (ZOFRAN-ODT) 4 MG disintegrating tablet Take 1 tablet (4 mg total) by mouth every 8 (eight) hours as needed for nausea or vomiting.   progesterone (PROMETRIUM) 100 MG capsule TAKE 1 CAPSULE BY MOUTH EVERY DAY   rosuvastatin (CRESTOR) 10 MG tablet Take 1 tablet (10 mg total) by mouth daily.   sertraline (ZOLOFT) 50 MG tablet Take 1/2 tablet daily x 10 days and then increase to one po q day   No facility-administered encounter medications on file as of 02/19/2022.     Lab Results  Component Value Date   WBC 6.3 02/02/2022   HGB 14.3 02/02/2022   HCT 41.2 02/02/2022   PLT 216 02/02/2022   GLUCOSE 129 (H) 02/02/2022   CHOL 104 12/29/2021   TRIG 107.0 12/29/2021   HDL 45.40 12/29/2021   LDLDIRECT 139.4 05/12/2013   LDLCALC 37 12/29/2021   ALT 11 02/02/2022   AST 20 02/02/2022   NA 137 02/02/2022   K 4.3 02/02/2022   CL 108 02/02/2022   CREATININE 0.84 02/02/2022   BUN 13 02/02/2022   CO2 22 02/02/2022   TSH 3.52 12/29/2021   HGBA1C 5.9 12/29/2021   HGBA1C 5.9 12/29/2021   MICROALBUR 3.5 (H) 12/29/2021       Assessment & Plan:   Problem List Items Addressed This Visit     CKD (chronic kidney disease) stage  3, GFR 30-59 ml/min (HCC)    Avoid antiinflammatories.  Stay hydrated.  Follow metabolic panel. Check today.        Dizziness    Recently evaluated for dizziness.  Was evaluated at Urgent Care 10/08/21 - after waking up to go to bathroom and felt like room was spinning. Position changes worsened.  Associated nausea.  In urgent care, EKG - no acute changes. Prescription given for zofran.  Reevaluated 10/15/21 - [name]) - diagnosed vertigo - improved. Was instructed at that time to decrease atenolol and was given rx for antivert.  She did not take antivert and it appears she decreased amlodipine instead of atenolol.  Given dizziness more positional at that time and had described the room spinning, was referred to ENT for further evaluation. Has not seen ENT to date.  Still with some persistent light headedness/dizziness. Have placed order for referral to neurology.  Also placed order for home health - PT/OT.  Hold amlodipine.  Treat anxiety.  Continue zoloft.  Follow.       Hypercholesterolemia    On crestor. Follow lipid panel and liver function tests.   Lab Results  Component Value Date   CHOL 104 12/29/2021   HDL 45.40 12/29/2021   LDLCALC 37 12/29/2021   LDLDIRECT 139.4 05/12/2013   TRIG 107.0 12/29/2021   CHOLHDL 2 12/29/2021       Hypertension    She is orthostatic on exam  today.  Neighbor called back and informed she is on amlodipine 37m q day and atenolol 566m2 per day.  (had previously instructed to take one per day).  She is having some episodes of tremors and possible increased heart rate.  Will have her hold amlodipine given decrease in blood pressure.  Follow pressures closely.  Call with update over the next few days.  Check metabolic panel.        Relevant Orders   Basic metabolic panel   Tremor    Described worsening tremor.  Discussed further neurology w/up and evaluation.  She is in agreement. Given tremor, will have OT evaluate and treat.  Referral has already  been placed for neurology.        Type 2 diabetes mellitus with hyperglycemia (HCC)    Decreased appetite.  Has lost weight.  Follow met b and a1c.        Weight loss    Persistent weight loss as outlined.  Stable from last couple of checks. Discussed.  Discussed further w/up and evaluation.  Will pursue neurology w/up first.  She is drinking ensure.  Follow.         Other Visit Diagnoses     Memory changes    -  Primary   Relevant Orders   POCT Urinalysis Dipstick (Completed)   Urine Culture (Completed)   CBC with Differential/Platelet        ChEinar PheasantMD

## 2022-02-19 NOTE — Telephone Encounter (Signed)
Darlene advised, hold Amlodipine. Will send in readings in 4-5 days unless something happens

## 2022-02-19 NOTE — Telephone Encounter (Signed)
Bad veins, very dehydrated at visit today. S/w pt caregiver, scheduled for Monday 1130am for re-draw. Caregiver advised to make sure pt hydrates.Marland Kitchen

## 2022-02-19 NOTE — Telephone Encounter (Signed)
Have them hold the amlodipine for now.  Monitor blood pressures and send in readings over the next few days.

## 2022-02-19 NOTE — Telephone Encounter (Signed)
Received a call from both [place] at [place] lab.  Both tubes collected today were severely hemolyzed. Not sure if it was a slow collection or if tubes were not inverted. Pt will need to be rescheduled for a lab & recollected. Please have CMA schedule lab appt.

## 2022-02-20 LAB — URINE CULTURE
MICRO NUMBER:: 13414670
SPECIMEN QUALITY:: ADEQUATE

## 2022-02-20 NOTE — Telephone Encounter (Signed)
Please call and notify Kara Dixon that apparently ENT and our referral coordinator have tried to schedule ENT appt.  Please give attached information to call and schedule an appt.

## 2022-02-23 ENCOUNTER — Other Ambulatory Visit (INDEPENDENT_AMBULATORY_CARE_PROVIDER_SITE_OTHER): Payer: Medicare Other

## 2022-02-23 ENCOUNTER — Encounter: Payer: Self-pay | Admitting: Internal Medicine

## 2022-02-23 DIAGNOSIS — I10 Essential (primary) hypertension: Secondary | ICD-10-CM | POA: Diagnosis not present

## 2022-02-23 DIAGNOSIS — R413 Other amnesia: Secondary | ICD-10-CM

## 2022-02-23 LAB — CBC WITH DIFFERENTIAL/PLATELET
Basophils Absolute: 0.1 10*3/uL (ref 0.0–0.1)
Basophils Relative: 0.7 % (ref 0.0–3.0)
Eosinophils Absolute: 0.2 10*3/uL (ref 0.0–0.7)
Eosinophils Relative: 1.7 % (ref 0.0–5.0)
HCT: 40.1 % (ref 36.0–46.0)
Hemoglobin: 13.4 g/dL (ref 12.0–15.0)
Lymphocytes Relative: 28 % (ref 12.0–46.0)
Lymphs Abs: 3 10*3/uL (ref 0.7–4.0)
MCHC: 33.4 g/dL (ref 30.0–36.0)
MCV: 94.6 fl (ref 78.0–100.0)
Monocytes Absolute: 1.1 10*3/uL — ABNORMAL HIGH (ref 0.1–1.0)
Monocytes Relative: 9.8 % (ref 3.0–12.0)
Neutro Abs: 6.5 10*3/uL (ref 1.4–7.7)
Neutrophils Relative %: 59.8 % (ref 43.0–77.0)
Platelets: 234 10*3/uL (ref 150.0–400.0)
RBC: 4.23 Mil/uL (ref 3.87–5.11)
RDW: 13.9 % (ref 11.5–15.5)
WBC: 10.9 10*3/uL — ABNORMAL HIGH (ref 4.0–10.5)

## 2022-02-23 LAB — BASIC METABOLIC PANEL
BUN: 16 mg/dL (ref 6–23)
CO2: 29 mEq/L (ref 19–32)
Calcium: 9.6 mg/dL (ref 8.4–10.5)
Chloride: 102 mEq/L (ref 96–112)
Creatinine, Ser: 0.94 mg/dL (ref 0.40–1.20)
GFR: 55.99 mL/min — ABNORMAL LOW (ref >60.00)
Glucose, Bld: 120 mg/dL — ABNORMAL HIGH (ref 70–99)
Potassium: 4.3 mEq/L (ref 3.5–5.1)
Sodium: 137 mEq/L (ref 135–145)

## 2022-02-23 NOTE — Assessment & Plan Note (Signed)
Recently evaluated for dizziness.  Was evaluated at Urgent Care 10/08/21 - after waking up to go to bathroom and felt like room was spinning. Position changes worsened.  Associated nausea.  In urgent care, EKG - no acute changes. Prescription given for zofran.  Reevaluated 10/15/21 - Kara Dixon) - diagnosed vertigo - improved. Was instructed at that time to decrease atenolol and was given rx for antivert.  She did not take antivert and it appears she decreased amlodipine instead of atenolol.  Given dizziness more positional at that time and had described the room spinning, was referred to ENT for further evaluation. Has not seen ENT to date.  Still with some persistent light headedness/dizziness. Have placed order for referral to neurology.  Also placed order for home health - PT/OT.  Hold amlodipine.  Treat anxiety.  Continue zoloft.  Follow.

## 2022-02-23 NOTE — Assessment & Plan Note (Signed)
Persistent weight loss as outlined.  Stable from last couple of checks. Discussed.  Discussed further w/up and evaluation.  Will pursue neurology w/up first.  She is drinking ensure.  Follow.

## 2022-02-23 NOTE — Assessment & Plan Note (Signed)
Described worsening tremor.  Discussed further neurology w/up and evaluation.  She is in agreement. Given tremor, will have OT evaluate and treat.  Referral has already been placed for neurology.

## 2022-02-23 NOTE — Assessment & Plan Note (Signed)
Decreased appetite.  Has lost weight.  Follow met b and a1c.

## 2022-02-23 NOTE — Assessment & Plan Note (Signed)
Avoid antiinflammatories.  Stay hydrated.  Follow metabolic panel. Check today.

## 2022-02-23 NOTE — Assessment & Plan Note (Signed)
On crestor. Follow lipid panel and liver function tests.   Lab Results  Component Value Date   CHOL 104 12/29/2021   HDL 45.40 12/29/2021   LDLCALC 37 12/29/2021   LDLDIRECT 139.4 05/12/2013   TRIG 107.0 12/29/2021   CHOLHDL 2 12/29/2021

## 2022-02-23 NOTE — Assessment & Plan Note (Signed)
She is orthostatic on exam today.  Neighbor called back and informed she is on amlodipine '5mg'$  q day and atenolol '50mg'$  2 per day.  (had previously instructed to take one per day).  She is having some episodes of tremors and possible increased heart rate.  Will have her hold amlodipine given decrease in blood pressure.  Follow pressures closely.  Call with update over the next few days.  Check metabolic panel.

## 2022-02-23 NOTE — Telephone Encounter (Signed)
Darlene (neighbor) advised - given information. Loma Sousa still out of town.

## 2022-02-25 ENCOUNTER — Telehealth: Payer: Self-pay

## 2022-02-25 ENCOUNTER — Other Ambulatory Visit: Payer: Self-pay

## 2022-02-25 MED ORDER — ESTROGENS CONJUGATED 0.3 MG PO TABS: 0.3000 mg | ORAL_TABLET | Freq: Every day | ORAL | 1 refills | Status: DC

## 2022-02-25 MED ORDER — PROGESTERONE MICRONIZED 100 MG PO CAPS: ORAL_CAPSULE | ORAL | 3 refills | Status: DC

## 2022-02-25 NOTE — Telephone Encounter (Signed)
LMTCB for lab results.  

## 2022-03-04 ENCOUNTER — Ambulatory Visit
Admission: RE | Admit: 2022-03-04 | Discharge: 2022-03-04 | Disposition: A | Discharge status: Home / Self Care | Payer: Medicare Other | Source: Ambulatory Visit | Attending: Internal Medicine | Admitting: Internal Medicine

## 2022-03-04 ENCOUNTER — Other Ambulatory Visit: Payer: Self-pay | Admitting: Internal Medicine

## 2022-03-04 DIAGNOSIS — R42 Dizziness and giddiness: Secondary | ICD-10-CM | POA: Insufficient documentation

## 2022-03-04 DIAGNOSIS — N1831 Chronic kidney disease, stage 3a: Secondary | ICD-10-CM

## 2022-03-04 DIAGNOSIS — I6782 Cerebral ischemia: Secondary | ICD-10-CM | POA: Diagnosis not present

## 2022-03-04 DIAGNOSIS — R410 Disorientation, unspecified: Secondary | ICD-10-CM | POA: Diagnosis not present

## 2022-03-04 DIAGNOSIS — R251 Tremor, unspecified: Secondary | ICD-10-CM | POA: Diagnosis not present

## 2022-03-04 DIAGNOSIS — E1165 Type 2 diabetes mellitus with hyperglycemia: Secondary | ICD-10-CM

## 2022-03-04 MED ORDER — GADOBUTROL 1 MMOL/ML IV SOLN
5.0000 mL | Freq: Once | INTRAVENOUS | Status: AC | PRN
  Administered 2022-03-04: 5 mL via INTRAVENOUS

## 2022-03-04 NOTE — Progress Notes (Signed)
Order placed for home health referral

## 2022-03-05 DIAGNOSIS — E7800 Pure hypercholesterolemia, unspecified: Secondary | ICD-10-CM | POA: Diagnosis not present

## 2022-03-05 DIAGNOSIS — Z87442 Personal history of urinary calculi: Secondary | ICD-10-CM | POA: Diagnosis not present

## 2022-03-05 DIAGNOSIS — Z8616 Personal history of COVID-19: Secondary | ICD-10-CM | POA: Diagnosis not present

## 2022-03-05 DIAGNOSIS — B029 Zoster without complications: Secondary | ICD-10-CM | POA: Diagnosis not present

## 2022-03-05 DIAGNOSIS — I129 Hypertensive chronic kidney disease with stage 1 through stage 4 chronic kidney disease, or unspecified chronic kidney disease: Secondary | ICD-10-CM | POA: Diagnosis not present

## 2022-03-05 DIAGNOSIS — N1831 Chronic kidney disease, stage 3a: Secondary | ICD-10-CM | POA: Diagnosis not present

## 2022-03-05 DIAGNOSIS — H8110 Benign paroxysmal vertigo, unspecified ear: Secondary | ICD-10-CM | POA: Diagnosis not present

## 2022-03-05 DIAGNOSIS — R251 Tremor, unspecified: Secondary | ICD-10-CM | POA: Diagnosis not present

## 2022-03-05 DIAGNOSIS — R1310 Dysphagia, unspecified: Secondary | ICD-10-CM | POA: Diagnosis not present

## 2022-03-05 DIAGNOSIS — R1110 Vomiting, unspecified: Secondary | ICD-10-CM | POA: Diagnosis not present

## 2022-03-05 DIAGNOSIS — Z7982 Long term (current) use of aspirin: Secondary | ICD-10-CM | POA: Diagnosis not present

## 2022-03-05 DIAGNOSIS — M8580 Other specified disorders of bone density and structure, unspecified site: Secondary | ICD-10-CM | POA: Diagnosis not present

## 2022-03-05 DIAGNOSIS — Z9181 History of falling: Secondary | ICD-10-CM | POA: Diagnosis not present

## 2022-03-05 DIAGNOSIS — I10 Essential (primary) hypertension: Secondary | ICD-10-CM | POA: Diagnosis not present

## 2022-03-05 DIAGNOSIS — E1122 Type 2 diabetes mellitus with diabetic chronic kidney disease: Secondary | ICD-10-CM | POA: Diagnosis not present

## 2022-03-06 ENCOUNTER — Telehealth: Payer: Self-pay

## 2022-03-06 DIAGNOSIS — R1310 Dysphagia, unspecified: Secondary | ICD-10-CM | POA: Diagnosis not present

## 2022-03-06 DIAGNOSIS — Z9181 History of falling: Secondary | ICD-10-CM | POA: Diagnosis not present

## 2022-03-06 DIAGNOSIS — Z7982 Long term (current) use of aspirin: Secondary | ICD-10-CM | POA: Diagnosis not present

## 2022-03-06 DIAGNOSIS — N1831 Chronic kidney disease, stage 3a: Secondary | ICD-10-CM | POA: Diagnosis not present

## 2022-03-06 DIAGNOSIS — R251 Tremor, unspecified: Secondary | ICD-10-CM | POA: Diagnosis not present

## 2022-03-06 DIAGNOSIS — Z87442 Personal history of urinary calculi: Secondary | ICD-10-CM | POA: Diagnosis not present

## 2022-03-06 DIAGNOSIS — E1122 Type 2 diabetes mellitus with diabetic chronic kidney disease: Secondary | ICD-10-CM | POA: Diagnosis not present

## 2022-03-06 DIAGNOSIS — I129 Hypertensive chronic kidney disease with stage 1 through stage 4 chronic kidney disease, or unspecified chronic kidney disease: Secondary | ICD-10-CM | POA: Diagnosis not present

## 2022-03-06 DIAGNOSIS — E7800 Pure hypercholesterolemia, unspecified: Secondary | ICD-10-CM | POA: Diagnosis not present

## 2022-03-06 DIAGNOSIS — H8110 Benign paroxysmal vertigo, unspecified ear: Secondary | ICD-10-CM | POA: Diagnosis not present

## 2022-03-06 DIAGNOSIS — M8580 Other specified disorders of bone density and structure, unspecified site: Secondary | ICD-10-CM | POA: Diagnosis not present

## 2022-03-06 DIAGNOSIS — B029 Zoster without complications: Secondary | ICD-10-CM | POA: Diagnosis not present

## 2022-03-06 DIAGNOSIS — R1110 Vomiting, unspecified: Secondary | ICD-10-CM | POA: Diagnosis not present

## 2022-03-06 DIAGNOSIS — Z8616 Personal history of COVID-19: Secondary | ICD-10-CM | POA: Diagnosis not present

## 2022-03-06 NOTE — Telephone Encounter (Signed)
Amy Pope called from [facility] to state they admitted patient yesterday into their home health services.  Amy states she has the following questions:  Progesterone - should patient take this three times per week or once per day?  Ativan - can patient have this as needed?  Amlodipine - should patient be taking this?  Can Amy have one of their social workers come see patient to discuss community resources?  Please call.

## 2022-03-06 NOTE — Telephone Encounter (Signed)
  Home health admitted pt today -  Per chart : 1) pt is to be taking Progesterone one daily. 2) She is not to be taking Lorazepam, is on sertraline. Off Lorazepam. 3) Holding Amlodipine as of now.  Ok to advise Amy ? [place] for home health social worker to discuss community resources w/ pt ?

## 2022-03-06 NOTE — Telephone Encounter (Signed)
Amy advised of below.  S/w Loma Sousa - agreed to in office consult [id number]. Sched 6/7 at 10am

## 2022-03-06 NOTE — Telephone Encounter (Signed)
She had informed me she was taking the progesterone three times per week.  Trying to get her off lorazepam.  Ok for social work consult.  Should be off amlodipine, but need blood pressure monitored.  Was orthostatic on exam and amlodipine was on put on hold.  If blood pressure starts increasing, I need to know.

## 2022-03-09 DIAGNOSIS — Z9181 History of falling: Secondary | ICD-10-CM | POA: Diagnosis not present

## 2022-03-09 DIAGNOSIS — Z8616 Personal history of COVID-19: Secondary | ICD-10-CM | POA: Diagnosis not present

## 2022-03-09 DIAGNOSIS — N1831 Chronic kidney disease, stage 3a: Secondary | ICD-10-CM | POA: Diagnosis not present

## 2022-03-09 DIAGNOSIS — M8580 Other specified disorders of bone density and structure, unspecified site: Secondary | ICD-10-CM | POA: Diagnosis not present

## 2022-03-09 DIAGNOSIS — I129 Hypertensive chronic kidney disease with stage 1 through stage 4 chronic kidney disease, or unspecified chronic kidney disease: Secondary | ICD-10-CM | POA: Diagnosis not present

## 2022-03-09 DIAGNOSIS — H8110 Benign paroxysmal vertigo, unspecified ear: Secondary | ICD-10-CM | POA: Diagnosis not present

## 2022-03-09 DIAGNOSIS — B029 Zoster without complications: Secondary | ICD-10-CM | POA: Diagnosis not present

## 2022-03-09 DIAGNOSIS — E1122 Type 2 diabetes mellitus with diabetic chronic kidney disease: Secondary | ICD-10-CM | POA: Diagnosis not present

## 2022-03-09 DIAGNOSIS — Z87442 Personal history of urinary calculi: Secondary | ICD-10-CM | POA: Diagnosis not present

## 2022-03-09 DIAGNOSIS — R1310 Dysphagia, unspecified: Secondary | ICD-10-CM | POA: Diagnosis not present

## 2022-03-09 DIAGNOSIS — Z7982 Long term (current) use of aspirin: Secondary | ICD-10-CM | POA: Diagnosis not present

## 2022-03-09 DIAGNOSIS — R1110 Vomiting, unspecified: Secondary | ICD-10-CM | POA: Diagnosis not present

## 2022-03-09 DIAGNOSIS — R251 Tremor, unspecified: Secondary | ICD-10-CM | POA: Diagnosis not present

## 2022-03-09 DIAGNOSIS — E7800 Pure hypercholesterolemia, unspecified: Secondary | ICD-10-CM | POA: Diagnosis not present

## 2022-03-10 DIAGNOSIS — Z7982 Long term (current) use of aspirin: Secondary | ICD-10-CM | POA: Diagnosis not present

## 2022-03-10 DIAGNOSIS — E7800 Pure hypercholesterolemia, unspecified: Secondary | ICD-10-CM | POA: Diagnosis not present

## 2022-03-10 DIAGNOSIS — Z9181 History of falling: Secondary | ICD-10-CM | POA: Diagnosis not present

## 2022-03-10 DIAGNOSIS — M8580 Other specified disorders of bone density and structure, unspecified site: Secondary | ICD-10-CM | POA: Diagnosis not present

## 2022-03-10 DIAGNOSIS — Z87442 Personal history of urinary calculi: Secondary | ICD-10-CM | POA: Diagnosis not present

## 2022-03-10 DIAGNOSIS — R1310 Dysphagia, unspecified: Secondary | ICD-10-CM | POA: Diagnosis not present

## 2022-03-10 DIAGNOSIS — R251 Tremor, unspecified: Secondary | ICD-10-CM | POA: Diagnosis not present

## 2022-03-10 DIAGNOSIS — E1122 Type 2 diabetes mellitus with diabetic chronic kidney disease: Secondary | ICD-10-CM | POA: Diagnosis not present

## 2022-03-10 DIAGNOSIS — R1110 Vomiting, unspecified: Secondary | ICD-10-CM | POA: Diagnosis not present

## 2022-03-10 DIAGNOSIS — B029 Zoster without complications: Secondary | ICD-10-CM | POA: Diagnosis not present

## 2022-03-10 DIAGNOSIS — H8110 Benign paroxysmal vertigo, unspecified ear: Secondary | ICD-10-CM | POA: Diagnosis not present

## 2022-03-10 DIAGNOSIS — Z8616 Personal history of COVID-19: Secondary | ICD-10-CM | POA: Diagnosis not present

## 2022-03-10 DIAGNOSIS — N1831 Chronic kidney disease, stage 3a: Secondary | ICD-10-CM | POA: Diagnosis not present

## 2022-03-10 DIAGNOSIS — I129 Hypertensive chronic kidney disease with stage 1 through stage 4 chronic kidney disease, or unspecified chronic kidney disease: Secondary | ICD-10-CM | POA: Diagnosis not present

## 2022-03-11 ENCOUNTER — Encounter: Payer: Self-pay | Admitting: Internal Medicine

## 2022-03-11 ENCOUNTER — Ambulatory Visit (INDEPENDENT_AMBULATORY_CARE_PROVIDER_SITE_OTHER): Payer: Medicare Other | Admitting: Internal Medicine

## 2022-03-11 DIAGNOSIS — R42 Dizziness and giddiness: Secondary | ICD-10-CM

## 2022-03-11 DIAGNOSIS — N1831 Chronic kidney disease, stage 3a: Secondary | ICD-10-CM | POA: Diagnosis not present

## 2022-03-11 DIAGNOSIS — I10 Essential (primary) hypertension: Secondary | ICD-10-CM | POA: Diagnosis not present

## 2022-03-11 DIAGNOSIS — E1165 Type 2 diabetes mellitus with hyperglycemia: Secondary | ICD-10-CM

## 2022-03-11 DIAGNOSIS — M8580 Other specified disorders of bone density and structure, unspecified site: Secondary | ICD-10-CM | POA: Diagnosis not present

## 2022-03-11 DIAGNOSIS — R1310 Dysphagia, unspecified: Secondary | ICD-10-CM | POA: Diagnosis not present

## 2022-03-11 DIAGNOSIS — R1110 Vomiting, unspecified: Secondary | ICD-10-CM | POA: Diagnosis not present

## 2022-03-11 DIAGNOSIS — H8110 Benign paroxysmal vertigo, unspecified ear: Secondary | ICD-10-CM | POA: Diagnosis not present

## 2022-03-11 DIAGNOSIS — Z9181 History of falling: Secondary | ICD-10-CM | POA: Diagnosis not present

## 2022-03-11 DIAGNOSIS — Z79899 Other long term (current) drug therapy: Secondary | ICD-10-CM | POA: Diagnosis not present

## 2022-03-11 DIAGNOSIS — E7800 Pure hypercholesterolemia, unspecified: Secondary | ICD-10-CM | POA: Diagnosis not present

## 2022-03-11 DIAGNOSIS — R251 Tremor, unspecified: Secondary | ICD-10-CM | POA: Diagnosis not present

## 2022-03-11 DIAGNOSIS — Z87442 Personal history of urinary calculi: Secondary | ICD-10-CM | POA: Diagnosis not present

## 2022-03-11 DIAGNOSIS — E1122 Type 2 diabetes mellitus with diabetic chronic kidney disease: Secondary | ICD-10-CM | POA: Diagnosis not present

## 2022-03-11 DIAGNOSIS — R634 Abnormal weight loss: Secondary | ICD-10-CM | POA: Diagnosis not present

## 2022-03-11 DIAGNOSIS — B029 Zoster without complications: Secondary | ICD-10-CM | POA: Diagnosis not present

## 2022-03-11 DIAGNOSIS — Z8616 Personal history of COVID-19: Secondary | ICD-10-CM | POA: Diagnosis not present

## 2022-03-11 DIAGNOSIS — F439 Reaction to severe stress, unspecified: Secondary | ICD-10-CM

## 2022-03-11 DIAGNOSIS — I129 Hypertensive chronic kidney disease with stage 1 through stage 4 chronic kidney disease, or unspecified chronic kidney disease: Secondary | ICD-10-CM | POA: Diagnosis not present

## 2022-03-11 DIAGNOSIS — Z7982 Long term (current) use of aspirin: Secondary | ICD-10-CM | POA: Diagnosis not present

## 2022-03-11 MED ORDER — BUSPIRONE HCL 5 MG PO TABS: 5.0000 mg | ORAL_TABLET | Freq: Every day | ORAL | 0 refills | Status: DC | PRN

## 2022-03-11 NOTE — Progress Notes (Signed)
Patient ID: Kara Dixon, female   DOB: [birth year], 86 y.o.   MRN: [id number]   Subjective:    Patient ID: Kara Dixon, female    DOB: January 20, 1936, 86 y.o.   MRN: [id number]   Patient here for a scheduled follow up.   Chief Complaint  Patient presents with   Follow-up        Anxiety   Weight Check   Insomnia   .   HPI She is accompanied by her daughter Loma Sousa).  History obtained from both of them.  She is doing some better. Reports feeling better.  We have adjusted her blood pressure medication.  Have stopped amlodipine.  Was orthostatic on last examination.  Reports appetite is getting better.  No chest pain.  Breathing stable.  No abdominal pain reported.  Daughter concerned regarding intermittent panic attacks and anxiety issues.  Sleeps ok, except for when has these episodes.  She will then get up and walk around the house.  On questioning, reports this appears to have improved as well.  Pt reports no episodes this past week.  Discussed treatment options.  Would like to keep her off lorazepam.  Discussed buspar.    Past Medical History:  Diagnosis Date   Cancer ([place])    skin   Hypercholesterolemia    Hypertension    Nephrolithiasis    Osteopenia    Vitamin D deficiency    Past Surgical History:  Procedure Laterality Date   CATARACT EXTRACTION W/PHACO Right 02/11/2021   Procedure: CATARACT EXTRACTION PHACO AND INTRAOCULAR LENS PLACEMENT ([place]) RIGHT;  Surgeon: Birder Robson, MD;  Location: [facility];  Service: Ophthalmology;  Laterality: Right;  CDE15.19 01:16.1 minutes   CATARACT EXTRACTION W/PHACO Left 02/25/2021   Procedure: CATARACT EXTRACTION PHACO AND INTRAOCULAR LENS PLACEMENT ([facility]) LEFT;  Surgeon: Birder Robson, MD;  Location: [facility];  Service: Ophthalmology;  Laterality: Left;  18.25 01:23.4   cyst removed under tongue  age 28   EYE SURGERY  02/2017   EYE LID; care everywhere   Family History  Problem Relation Age of Onset    Stroke Mother    Hypertension Mother    Fibromyalgia Sister    Breast cancer Neg Hx    Colon cancer Neg Hx    Social History   Socioeconomic History   Marital status: Married    Spouse name: Not on file   Number of children: 1   Years of education: Not on file   Highest education level: Not on file  Occupational History   Not on file  Tobacco Use   Smoking status: Never   Smokeless tobacco: Never  Vaping Use   Vaping Use: Never used  Substance and Sexual Activity   Alcohol use: No    Alcohol/week: 0.0 standard drinks of alcohol   Drug use: No   Sexual activity: Not Currently  Other Topics Concern   Not on file  Social History Narrative   Not on file   Social Determinants of Health   Financial Resource Strain: Low Risk  (05/12/2021)   Overall Financial Resource Strain (CARDIA)    Difficulty of Paying Living Expenses: Not hard at all  Food Insecurity: No Food Insecurity (05/12/2021)   Hunger Vital Sign    Worried About Running Out of Food in the Last Year: Never true    [place] in the Last Year: Never true  Transportation Needs: No Transportation Needs (05/12/2021)   PRAPARE - Transportation    Lack of Transportation (  Medical): No    Lack of Transportation (Non-Medical): No  Physical Activity: Unknown (05/12/2021)   Exercise Vital Sign    Days of Exercise per Week: 0 days    Minutes of Exercise per Session: Not on file  Stress: No Stress Concern Present (05/12/2021)   [place]    Feeling of Stress : Not at all  Social Connections: Unknown (05/12/2021)   Social Connection and Isolation Panel [NHANES]    Frequency of Communication with Friends and Family: More than three times a week    Frequency of Social Gatherings with Friends and Family: Not on file    Attends Religious Services: Not on file    Active Member of Clubs or Organizations: Not on file    Attends Archivist Meetings: Not on  file    Marital Status: Married     Review of Systems  Constitutional:  Negative for fever.       Reports appetite has improved. She reports eating better.   HENT:  Negative for congestion and sinus pressure.   Respiratory:  Negative for cough, chest tightness and shortness of breath.   Cardiovascular:  Negative for chest pain, palpitations and leg swelling.  Gastrointestinal:  Negative for abdominal pain, diarrhea, nausea and vomiting.  Genitourinary:  Negative for difficulty urinating and dysuria.  Musculoskeletal:  Negative for joint swelling and myalgias.  Skin:  Negative for color change and rash.  Neurological:  Negative for headaches.       Dizziness appears to be some better.   Psychiatric/Behavioral:  Negative for agitation and dysphoric mood.        Some anxiety as outlined.        Objective:     BP 130/60 (BP Location: Left Arm, Patient Position: Sitting, Cuff Size: Small)   Pulse 68   Temp (!) 97.5 F (36.4 C) (Temporal)   Resp 17   Ht 5' (1.524 m)   Wt 109 lb 12.8 oz (49.8 kg)   SpO2 97%   BMI 21.44 kg/m  Wt Readings from Last 3 Encounters:  03/11/22 109 lb 12.8 oz (49.8 kg)  02/19/22 108 lb 3.2 oz (49.1 kg)  02/14/22 107 lb 9.6 oz (48.8 kg)    Physical Exam Vitals reviewed.  Constitutional:      General: She is not in acute distress.    Appearance: Normal appearance.  HENT:     Head: Normocephalic and atraumatic.     Right Ear: External ear normal.     Left Ear: External ear normal.  Eyes:     General: No scleral icterus.       Right eye: No discharge.        Left eye: No discharge.     Conjunctiva/sclera: Conjunctivae normal.  Neck:     Thyroid: No thyromegaly.  Cardiovascular:     Rate and Rhythm: Normal rate and regular rhythm.  Pulmonary:     Effort: No respiratory distress.     Breath sounds: Normal breath sounds. No wheezing.  Abdominal:     General: Bowel sounds are normal.     Palpations: Abdomen is soft.     Tenderness: There is  no abdominal tenderness.  Musculoskeletal:        General: No swelling or tenderness.     Cervical back: Neck supple. No tenderness.  Lymphadenopathy:     Cervical: No cervical adenopathy.  Skin:    Findings: No erythema or rash.  Neurological:  Mental Status: She is alert.  Psychiatric:        Mood and Affect: Mood normal.        Behavior: Behavior normal.      Outpatient Encounter Medications as of 03/11/2022  Medication Sig   aspirin 81 MG tablet Take 81 mg by mouth daily.   atenolol (TENORMIN) 50 MG tablet One tablet q day   busPIRone (BUSPAR) 5 MG tablet Take 1 tablet (5 mg total) by mouth daily as needed.   fexofenadine (ALLEGRA) 180 MG tablet Take 180 mg by mouth as needed.    lactase (LACTAID) 3000 UNITS tablet Take 1 tablet by mouth as needed.   ondansetron (ZOFRAN-ODT) 4 MG disintegrating tablet Take 1 tablet (4 mg total) by mouth every 8 (eight) hours as needed for nausea or vomiting.   rosuvastatin (CRESTOR) 10 MG tablet Take 1 tablet (10 mg total) by mouth daily.   sertraline (ZOLOFT) 50 MG tablet TAKE ONE-HALF TABLET BY MOUTH ONCE DAILY FOR 10 DAYS, THEN INCREASE TO 1 TABLET DAILY   [DISCONTINUED] estrogens, conjugated, (PREMARIN) 0.3 MG tablet Take 1 tablet (0.3 mg total) by mouth daily.   [DISCONTINUED] progesterone (PROMETRIUM) 100 MG capsule TAKE 1 CAPSULE BY MOUTH EVERY DAY   [DISCONTINUED] amLODipine (NORVASC) 5 MG tablet TAKE 1 TABLET (5 MG TOTAL) BY MOUTH DAILY. (Patient not taking: Reported on 03/11/2022)   [DISCONTINUED] LORazepam (ATIVAN) 0.5 MG tablet TAKE 1/2 TAB BY MOUTH AT BEDTIME AS NEEDED,but may use 1/2  up to tid prn for anxiety and panic   No facility-administered encounter medications on file as of 03/11/2022.     Lab Results  Component Value Date   WBC 10.9 (H) 02/23/2022   HGB 13.4 02/23/2022   HCT 40.1 02/23/2022   PLT 234.0 02/23/2022   GLUCOSE 120 (H) 02/23/2022   CHOL 104 12/29/2021   TRIG 107.0 12/29/2021   HDL 45.40 12/29/2021    LDLDIRECT 139.4 05/12/2013   LDLCALC 37 12/29/2021   ALT 11 02/02/2022   AST 20 02/02/2022   NA 137 02/23/2022   K 4.3 02/23/2022   CL 102 02/23/2022   CREATININE 0.94 02/23/2022   BUN 16 02/23/2022   CO2 29 02/23/2022   TSH 3.52 12/29/2021   HGBA1C 5.9 12/29/2021   HGBA1C 5.9 12/29/2021   MICROALBUR 3.5 (H) 12/29/2021    MR Brain W Wo Contrast  Result Date: 03/04/2022 CLINICAL DATA:  Right history: Tremor. Dizziness. Dizziness, nonspecific. Additional history provided by scanning technologist: Vertigo and confusion for 3 months. EXAM: MRI HEAD WITHOUT AND WITH CONTRAST TECHNIQUE: Multiplanar, multiecho pulse sequences of the brain and surrounding structures were obtained without and with intravenous contrast. CONTRAST:  59m GADAVIST GADOBUTROL 1 MMOL/ML IV SOLN COMPARISON:  No pertinent prior exams available for comparison. FINDINGS: Brain: No age advanced or lobar predominant parenchymal atrophy. Multifocal T2 FLAIR hyperintense signal abnormality within the cerebral white matter and pons, nonspecific but compatible with mild chronic small vessel ischemic disease. Prominent perivascular space within the inferior right basal ganglia. 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 There is no acute infarct. No evidence of an intracranial mass. No chronic intracranial blood products. No extra-axial fluid collection. No midline shift. No pathologic intracranial enhancement identified. Vascular: Maintained flow voids within the proximal large arterial vessels. Skull and upper cervical spine: No focal suspicious marrow lesion. Sinuses/Orbits: No mass or acute finding within the imaged orbits. Prior bilateral ocular lens replacement. Mild mucosal thickening within the bilateral ethmoid, right sphenoid  and bilateral maxillary sinuses. IMPRESSION: No evidence of acute intracranial abnormality. Mild chronic small vessel ischemic changes within the cerebral white  matter and pons. 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 Mild paranasal sinus mucosal thickening. Electronically Signed   By: Kellie Simmering D.O.   On: 03/04/2022 14:43       Assessment & Plan:   Problem List Items Addressed This Visit     CKD (chronic kidney disease) stage 3, GFR 30-59 ml/min (HCC)    Avoid antiinflammatories.  Stay hydrated.  Follow metabolic panel.       Current use of estrogen therapy    Discussed today.  Will taper off. Not taking regularly.       Dizziness    Recently evaluated for dizziness.  Was evaluated at Urgent Care 10/08/21 - after waking up to go to bathroom and felt like room was spinning. Position changes worsened.  Associated nausea.  In urgent care, EKG - no acute changes. Prescription given for zofran.  Reevaluated 10/15/21 - [name]) - diagnosed vertigo - improved. Was instructed at that time to decrease atenolol and was given rx for antivert.  She did not take antivert and it appears she decreased amlodipine instead of atenolol.  Given dizziness more positional at that time and had described the room spinning, was referred to ENT for further evaluation. Has not seen ENT to date.  Comes in today with persistent light headedness/dizziness, but has improved.  MRI no acute abnormality.  Off lorazepam.  Given dizziness and unsteadiness - home health PT - in to assess and treat. Off amlodipine. Overall appears to be doing better.       Hypercholesterolemia    On crestor. Follow lipid panel and liver function tests.   Lab Results  Component Value Date   CHOL 104 12/29/2021   HDL 45.40 12/29/2021   LDLCALC 37 12/29/2021   LDLDIRECT 139.4 05/12/2013   TRIG 107.0 12/29/2021   CHOLHDL 2 12/29/2021       Hypertension    Off amlodipine now as outlined.  Was orthostatic on last exam.  Blood pressure doing well on atenolol.  Follow pressures.  Follow metabolic panel.       Stress    Overall appears to be doing better.  Off  lorazepam.  Discussed buspar.  On zoloft.  Follow.       Type 2 diabetes mellitus with hyperglycemia ([place])    Follow met b and a1c.  Eating better now.       Weight loss    Weight stable - up a pound.  Eating better now.  Appetite is better.  Follow.         Einar Pheasant, MD

## 2022-03-12 DIAGNOSIS — Z7982 Long term (current) use of aspirin: Secondary | ICD-10-CM | POA: Diagnosis not present

## 2022-03-12 DIAGNOSIS — E7800 Pure hypercholesterolemia, unspecified: Secondary | ICD-10-CM | POA: Diagnosis not present

## 2022-03-12 DIAGNOSIS — I129 Hypertensive chronic kidney disease with stage 1 through stage 4 chronic kidney disease, or unspecified chronic kidney disease: Secondary | ICD-10-CM | POA: Diagnosis not present

## 2022-03-12 DIAGNOSIS — Z87442 Personal history of urinary calculi: Secondary | ICD-10-CM | POA: Diagnosis not present

## 2022-03-12 DIAGNOSIS — R251 Tremor, unspecified: Secondary | ICD-10-CM | POA: Diagnosis not present

## 2022-03-12 DIAGNOSIS — M8580 Other specified disorders of bone density and structure, unspecified site: Secondary | ICD-10-CM | POA: Diagnosis not present

## 2022-03-12 DIAGNOSIS — E1122 Type 2 diabetes mellitus with diabetic chronic kidney disease: Secondary | ICD-10-CM | POA: Diagnosis not present

## 2022-03-12 DIAGNOSIS — H8110 Benign paroxysmal vertigo, unspecified ear: Secondary | ICD-10-CM | POA: Diagnosis not present

## 2022-03-12 DIAGNOSIS — R1310 Dysphagia, unspecified: Secondary | ICD-10-CM | POA: Diagnosis not present

## 2022-03-12 DIAGNOSIS — N1831 Chronic kidney disease, stage 3a: Secondary | ICD-10-CM | POA: Diagnosis not present

## 2022-03-12 DIAGNOSIS — R1110 Vomiting, unspecified: Secondary | ICD-10-CM | POA: Diagnosis not present

## 2022-03-12 DIAGNOSIS — B029 Zoster without complications: Secondary | ICD-10-CM | POA: Diagnosis not present

## 2022-03-12 DIAGNOSIS — Z9181 History of falling: Secondary | ICD-10-CM | POA: Diagnosis not present

## 2022-03-12 DIAGNOSIS — Z8616 Personal history of COVID-19: Secondary | ICD-10-CM | POA: Diagnosis not present

## 2022-03-13 DIAGNOSIS — M8580 Other specified disorders of bone density and structure, unspecified site: Secondary | ICD-10-CM | POA: Diagnosis not present

## 2022-03-13 DIAGNOSIS — N1831 Chronic kidney disease, stage 3a: Secondary | ICD-10-CM | POA: Diagnosis not present

## 2022-03-13 DIAGNOSIS — E7800 Pure hypercholesterolemia, unspecified: Secondary | ICD-10-CM | POA: Diagnosis not present

## 2022-03-13 DIAGNOSIS — Z8616 Personal history of COVID-19: Secondary | ICD-10-CM | POA: Diagnosis not present

## 2022-03-13 DIAGNOSIS — E1122 Type 2 diabetes mellitus with diabetic chronic kidney disease: Secondary | ICD-10-CM | POA: Diagnosis not present

## 2022-03-13 DIAGNOSIS — R1110 Vomiting, unspecified: Secondary | ICD-10-CM | POA: Diagnosis not present

## 2022-03-13 DIAGNOSIS — B029 Zoster without complications: Secondary | ICD-10-CM | POA: Diagnosis not present

## 2022-03-13 DIAGNOSIS — Z9181 History of falling: Secondary | ICD-10-CM | POA: Diagnosis not present

## 2022-03-13 DIAGNOSIS — Z87442 Personal history of urinary calculi: Secondary | ICD-10-CM | POA: Diagnosis not present

## 2022-03-13 DIAGNOSIS — R251 Tremor, unspecified: Secondary | ICD-10-CM | POA: Diagnosis not present

## 2022-03-13 DIAGNOSIS — Z7982 Long term (current) use of aspirin: Secondary | ICD-10-CM | POA: Diagnosis not present

## 2022-03-13 DIAGNOSIS — I129 Hypertensive chronic kidney disease with stage 1 through stage 4 chronic kidney disease, or unspecified chronic kidney disease: Secondary | ICD-10-CM | POA: Diagnosis not present

## 2022-03-13 DIAGNOSIS — H8110 Benign paroxysmal vertigo, unspecified ear: Secondary | ICD-10-CM | POA: Diagnosis not present

## 2022-03-13 DIAGNOSIS — R1310 Dysphagia, unspecified: Secondary | ICD-10-CM | POA: Diagnosis not present

## 2022-03-16 ENCOUNTER — Encounter: Payer: Self-pay | Admitting: Internal Medicine

## 2022-03-16 ENCOUNTER — Telehealth: Payer: Self-pay | Admitting: Internal Medicine

## 2022-03-16 DIAGNOSIS — E1122 Type 2 diabetes mellitus with diabetic chronic kidney disease: Secondary | ICD-10-CM | POA: Diagnosis not present

## 2022-03-16 DIAGNOSIS — H8110 Benign paroxysmal vertigo, unspecified ear: Secondary | ICD-10-CM | POA: Diagnosis not present

## 2022-03-16 DIAGNOSIS — Z7982 Long term (current) use of aspirin: Secondary | ICD-10-CM | POA: Diagnosis not present

## 2022-03-16 DIAGNOSIS — Z9181 History of falling: Secondary | ICD-10-CM | POA: Diagnosis not present

## 2022-03-16 DIAGNOSIS — B029 Zoster without complications: Secondary | ICD-10-CM | POA: Diagnosis not present

## 2022-03-16 DIAGNOSIS — M8580 Other specified disorders of bone density and structure, unspecified site: Secondary | ICD-10-CM | POA: Diagnosis not present

## 2022-03-16 DIAGNOSIS — F419 Anxiety disorder, unspecified: Secondary | ICD-10-CM

## 2022-03-16 DIAGNOSIS — R1310 Dysphagia, unspecified: Secondary | ICD-10-CM | POA: Diagnosis not present

## 2022-03-16 DIAGNOSIS — R251 Tremor, unspecified: Secondary | ICD-10-CM | POA: Diagnosis not present

## 2022-03-16 DIAGNOSIS — R1110 Vomiting, unspecified: Secondary | ICD-10-CM | POA: Diagnosis not present

## 2022-03-16 DIAGNOSIS — Z87442 Personal history of urinary calculi: Secondary | ICD-10-CM | POA: Diagnosis not present

## 2022-03-16 DIAGNOSIS — E7800 Pure hypercholesterolemia, unspecified: Secondary | ICD-10-CM | POA: Diagnosis not present

## 2022-03-16 DIAGNOSIS — Z8616 Personal history of COVID-19: Secondary | ICD-10-CM | POA: Diagnosis not present

## 2022-03-16 DIAGNOSIS — I129 Hypertensive chronic kidney disease with stage 1 through stage 4 chronic kidney disease, or unspecified chronic kidney disease: Secondary | ICD-10-CM | POA: Diagnosis not present

## 2022-03-16 DIAGNOSIS — N1831 Chronic kidney disease, stage 3a: Secondary | ICD-10-CM | POA: Diagnosis not present

## 2022-03-16 NOTE — Assessment & Plan Note (Signed)
Weight stable - up a pound.  Eating better now.  Appetite is better.  Follow.

## 2022-03-16 NOTE — Assessment & Plan Note (Signed)
Avoid antiinflammatories.  Stay hydrated.  Follow metabolic panel.   

## 2022-03-16 NOTE — Assessment & Plan Note (Signed)
Follow met b and a1c.  Eating better now.

## 2022-03-16 NOTE — Assessment & Plan Note (Signed)
Recently evaluated for dizziness.  Was evaluated at Urgent Care 10/08/21 - after waking up to go to bathroom and felt like room was spinning. Position changes worsened.  Associated nausea.  In urgent care, EKG - no acute changes. Prescription given for zofran.  Reevaluated 10/15/21 - [name]) - diagnosed vertigo - improved. Was instructed at that time to decrease atenolol and was given rx for antivert.  She did not take antivert and it appears she decreased amlodipine instead of atenolol.  Given dizziness more positional at that time and had described the room spinning, was referred to ENT for further evaluation. Has not seen ENT to date.  Comes in today with persistent light headedness/dizziness, but has improved.  MRI no acute abnormality.  Off lorazepam.  Given dizziness and unsteadiness - home health PT - in to assess and treat. Off amlodipine. Overall appears to be doing better.

## 2022-03-16 NOTE — Assessment & Plan Note (Signed)
On crestor. Follow lipid panel and liver function tests.   Lab Results  Component Value Date   CHOL 104 12/29/2021   HDL 45.40 12/29/2021   LDLCALC 37 12/29/2021   LDLDIRECT 139.4 05/12/2013   TRIG 107.0 12/29/2021   CHOLHDL 2 12/29/2021

## 2022-03-16 NOTE — Assessment & Plan Note (Signed)
Off amlodipine now as outlined.  Was orthostatic on last exam.  Blood pressure doing well on atenolol.  Follow pressures.  Follow metabolic panel.

## 2022-03-16 NOTE — Telephone Encounter (Signed)
Randall Hiss dropped of plan of care forms to be filled out by Dr. Nicki Reaper. Forms will be upfront in color folder

## 2022-03-16 NOTE — Assessment & Plan Note (Signed)
Overall appears to be doing better.  Off lorazepam.  Discussed buspar.  On zoloft.  Follow.

## 2022-03-16 NOTE — Telephone Encounter (Signed)
Form placed in your red folder for you to sign.  Ponce Skillman,cma

## 2022-03-16 NOTE — Assessment & Plan Note (Signed)
Discussed today.  Will taper off. Not taking regularly.

## 2022-03-17 DIAGNOSIS — R1110 Vomiting, unspecified: Secondary | ICD-10-CM | POA: Diagnosis not present

## 2022-03-17 DIAGNOSIS — Z87442 Personal history of urinary calculi: Secondary | ICD-10-CM | POA: Diagnosis not present

## 2022-03-17 DIAGNOSIS — R1310 Dysphagia, unspecified: Secondary | ICD-10-CM | POA: Diagnosis not present

## 2022-03-17 DIAGNOSIS — E1122 Type 2 diabetes mellitus with diabetic chronic kidney disease: Secondary | ICD-10-CM | POA: Diagnosis not present

## 2022-03-17 DIAGNOSIS — N1831 Chronic kidney disease, stage 3a: Secondary | ICD-10-CM | POA: Diagnosis not present

## 2022-03-17 DIAGNOSIS — M8580 Other specified disorders of bone density and structure, unspecified site: Secondary | ICD-10-CM | POA: Diagnosis not present

## 2022-03-17 DIAGNOSIS — Z9181 History of falling: Secondary | ICD-10-CM | POA: Diagnosis not present

## 2022-03-17 DIAGNOSIS — H8110 Benign paroxysmal vertigo, unspecified ear: Secondary | ICD-10-CM | POA: Diagnosis not present

## 2022-03-17 DIAGNOSIS — E7800 Pure hypercholesterolemia, unspecified: Secondary | ICD-10-CM | POA: Diagnosis not present

## 2022-03-17 DIAGNOSIS — B029 Zoster without complications: Secondary | ICD-10-CM | POA: Diagnosis not present

## 2022-03-17 DIAGNOSIS — R251 Tremor, unspecified: Secondary | ICD-10-CM | POA: Diagnosis not present

## 2022-03-17 DIAGNOSIS — Z8616 Personal history of COVID-19: Secondary | ICD-10-CM | POA: Diagnosis not present

## 2022-03-17 DIAGNOSIS — I129 Hypertensive chronic kidney disease with stage 1 through stage 4 chronic kidney disease, or unspecified chronic kidney disease: Secondary | ICD-10-CM | POA: Diagnosis not present

## 2022-03-17 DIAGNOSIS — Z7982 Long term (current) use of aspirin: Secondary | ICD-10-CM | POA: Diagnosis not present

## 2022-03-17 NOTE — Telephone Encounter (Signed)
Faxed  Confirmation received - sent ok

## 2022-03-17 NOTE — Telephone Encounter (Signed)
Signed and placed in box.   

## 2022-03-18 DIAGNOSIS — R251 Tremor, unspecified: Secondary | ICD-10-CM | POA: Diagnosis not present

## 2022-03-18 DIAGNOSIS — Z8616 Personal history of COVID-19: Secondary | ICD-10-CM | POA: Diagnosis not present

## 2022-03-18 DIAGNOSIS — R1110 Vomiting, unspecified: Secondary | ICD-10-CM | POA: Diagnosis not present

## 2022-03-18 DIAGNOSIS — Z7982 Long term (current) use of aspirin: Secondary | ICD-10-CM | POA: Diagnosis not present

## 2022-03-18 DIAGNOSIS — R1310 Dysphagia, unspecified: Secondary | ICD-10-CM | POA: Diagnosis not present

## 2022-03-18 DIAGNOSIS — H8110 Benign paroxysmal vertigo, unspecified ear: Secondary | ICD-10-CM | POA: Diagnosis not present

## 2022-03-18 DIAGNOSIS — E7800 Pure hypercholesterolemia, unspecified: Secondary | ICD-10-CM | POA: Diagnosis not present

## 2022-03-18 DIAGNOSIS — N1831 Chronic kidney disease, stage 3a: Secondary | ICD-10-CM | POA: Diagnosis not present

## 2022-03-18 DIAGNOSIS — E1122 Type 2 diabetes mellitus with diabetic chronic kidney disease: Secondary | ICD-10-CM | POA: Diagnosis not present

## 2022-03-18 DIAGNOSIS — Z87442 Personal history of urinary calculi: Secondary | ICD-10-CM | POA: Diagnosis not present

## 2022-03-18 DIAGNOSIS — Z9181 History of falling: Secondary | ICD-10-CM | POA: Diagnosis not present

## 2022-03-18 DIAGNOSIS — I129 Hypertensive chronic kidney disease with stage 1 through stage 4 chronic kidney disease, or unspecified chronic kidney disease: Secondary | ICD-10-CM | POA: Diagnosis not present

## 2022-03-18 DIAGNOSIS — M8580 Other specified disorders of bone density and structure, unspecified site: Secondary | ICD-10-CM | POA: Diagnosis not present

## 2022-03-18 DIAGNOSIS — B029 Zoster without complications: Secondary | ICD-10-CM | POA: Diagnosis not present

## 2022-03-19 DIAGNOSIS — N1831 Chronic kidney disease, stage 3a: Secondary | ICD-10-CM | POA: Diagnosis not present

## 2022-03-19 DIAGNOSIS — Z7982 Long term (current) use of aspirin: Secondary | ICD-10-CM | POA: Diagnosis not present

## 2022-03-19 DIAGNOSIS — R1110 Vomiting, unspecified: Secondary | ICD-10-CM | POA: Diagnosis not present

## 2022-03-19 DIAGNOSIS — E7800 Pure hypercholesterolemia, unspecified: Secondary | ICD-10-CM | POA: Diagnosis not present

## 2022-03-19 DIAGNOSIS — Z9181 History of falling: Secondary | ICD-10-CM | POA: Diagnosis not present

## 2022-03-19 DIAGNOSIS — R251 Tremor, unspecified: Secondary | ICD-10-CM | POA: Diagnosis not present

## 2022-03-19 DIAGNOSIS — Z8616 Personal history of COVID-19: Secondary | ICD-10-CM | POA: Diagnosis not present

## 2022-03-19 DIAGNOSIS — Z87442 Personal history of urinary calculi: Secondary | ICD-10-CM | POA: Diagnosis not present

## 2022-03-19 DIAGNOSIS — R1310 Dysphagia, unspecified: Secondary | ICD-10-CM | POA: Diagnosis not present

## 2022-03-19 DIAGNOSIS — B029 Zoster without complications: Secondary | ICD-10-CM | POA: Diagnosis not present

## 2022-03-19 DIAGNOSIS — H8110 Benign paroxysmal vertigo, unspecified ear: Secondary | ICD-10-CM | POA: Diagnosis not present

## 2022-03-19 DIAGNOSIS — I129 Hypertensive chronic kidney disease with stage 1 through stage 4 chronic kidney disease, or unspecified chronic kidney disease: Secondary | ICD-10-CM | POA: Diagnosis not present

## 2022-03-19 DIAGNOSIS — M8580 Other specified disorders of bone density and structure, unspecified site: Secondary | ICD-10-CM | POA: Diagnosis not present

## 2022-03-19 DIAGNOSIS — E1122 Type 2 diabetes mellitus with diabetic chronic kidney disease: Secondary | ICD-10-CM | POA: Diagnosis not present

## 2022-03-20 ENCOUNTER — Other Ambulatory Visit: Payer: Self-pay | Admitting: Internal Medicine

## 2022-03-20 DIAGNOSIS — I129 Hypertensive chronic kidney disease with stage 1 through stage 4 chronic kidney disease, or unspecified chronic kidney disease: Secondary | ICD-10-CM | POA: Diagnosis not present

## 2022-03-20 DIAGNOSIS — E7800 Pure hypercholesterolemia, unspecified: Secondary | ICD-10-CM | POA: Diagnosis not present

## 2022-03-20 DIAGNOSIS — N1831 Chronic kidney disease, stage 3a: Secondary | ICD-10-CM | POA: Diagnosis not present

## 2022-03-20 DIAGNOSIS — Z7982 Long term (current) use of aspirin: Secondary | ICD-10-CM | POA: Diagnosis not present

## 2022-03-20 DIAGNOSIS — R1310 Dysphagia, unspecified: Secondary | ICD-10-CM | POA: Diagnosis not present

## 2022-03-20 DIAGNOSIS — R251 Tremor, unspecified: Secondary | ICD-10-CM | POA: Diagnosis not present

## 2022-03-20 DIAGNOSIS — B029 Zoster without complications: Secondary | ICD-10-CM | POA: Diagnosis not present

## 2022-03-20 DIAGNOSIS — M8580 Other specified disorders of bone density and structure, unspecified site: Secondary | ICD-10-CM | POA: Diagnosis not present

## 2022-03-20 DIAGNOSIS — H8110 Benign paroxysmal vertigo, unspecified ear: Secondary | ICD-10-CM | POA: Diagnosis not present

## 2022-03-20 DIAGNOSIS — E1122 Type 2 diabetes mellitus with diabetic chronic kidney disease: Secondary | ICD-10-CM | POA: Diagnosis not present

## 2022-03-20 DIAGNOSIS — Z9181 History of falling: Secondary | ICD-10-CM | POA: Diagnosis not present

## 2022-03-20 DIAGNOSIS — R1110 Vomiting, unspecified: Secondary | ICD-10-CM | POA: Diagnosis not present

## 2022-03-20 DIAGNOSIS — Z8616 Personal history of COVID-19: Secondary | ICD-10-CM | POA: Diagnosis not present

## 2022-03-20 DIAGNOSIS — E785 Hyperlipidemia, unspecified: Secondary | ICD-10-CM

## 2022-03-20 DIAGNOSIS — Z87442 Personal history of urinary calculi: Secondary | ICD-10-CM | POA: Diagnosis not present

## 2022-03-23 DIAGNOSIS — H8110 Benign paroxysmal vertigo, unspecified ear: Secondary | ICD-10-CM | POA: Diagnosis not present

## 2022-03-23 DIAGNOSIS — Z87442 Personal history of urinary calculi: Secondary | ICD-10-CM | POA: Diagnosis not present

## 2022-03-23 DIAGNOSIS — R1310 Dysphagia, unspecified: Secondary | ICD-10-CM | POA: Diagnosis not present

## 2022-03-23 DIAGNOSIS — Z7982 Long term (current) use of aspirin: Secondary | ICD-10-CM | POA: Diagnosis not present

## 2022-03-23 DIAGNOSIS — Z8616 Personal history of COVID-19: Secondary | ICD-10-CM | POA: Diagnosis not present

## 2022-03-23 DIAGNOSIS — Z9181 History of falling: Secondary | ICD-10-CM | POA: Diagnosis not present

## 2022-03-23 DIAGNOSIS — E1122 Type 2 diabetes mellitus with diabetic chronic kidney disease: Secondary | ICD-10-CM | POA: Diagnosis not present

## 2022-03-23 DIAGNOSIS — B029 Zoster without complications: Secondary | ICD-10-CM | POA: Diagnosis not present

## 2022-03-23 DIAGNOSIS — M8580 Other specified disorders of bone density and structure, unspecified site: Secondary | ICD-10-CM | POA: Diagnosis not present

## 2022-03-23 DIAGNOSIS — N1831 Chronic kidney disease, stage 3a: Secondary | ICD-10-CM | POA: Diagnosis not present

## 2022-03-23 DIAGNOSIS — R251 Tremor, unspecified: Secondary | ICD-10-CM | POA: Diagnosis not present

## 2022-03-23 DIAGNOSIS — I129 Hypertensive chronic kidney disease with stage 1 through stage 4 chronic kidney disease, or unspecified chronic kidney disease: Secondary | ICD-10-CM | POA: Diagnosis not present

## 2022-03-23 DIAGNOSIS — E7800 Pure hypercholesterolemia, unspecified: Secondary | ICD-10-CM | POA: Diagnosis not present

## 2022-03-23 DIAGNOSIS — R1110 Vomiting, unspecified: Secondary | ICD-10-CM | POA: Diagnosis not present

## 2022-03-24 DIAGNOSIS — R251 Tremor, unspecified: Secondary | ICD-10-CM | POA: Diagnosis not present

## 2022-03-24 DIAGNOSIS — Z9181 History of falling: Secondary | ICD-10-CM | POA: Diagnosis not present

## 2022-03-24 DIAGNOSIS — Z7982 Long term (current) use of aspirin: Secondary | ICD-10-CM | POA: Diagnosis not present

## 2022-03-24 DIAGNOSIS — Z8616 Personal history of COVID-19: Secondary | ICD-10-CM | POA: Diagnosis not present

## 2022-03-24 DIAGNOSIS — E1122 Type 2 diabetes mellitus with diabetic chronic kidney disease: Secondary | ICD-10-CM | POA: Diagnosis not present

## 2022-03-24 DIAGNOSIS — E7800 Pure hypercholesterolemia, unspecified: Secondary | ICD-10-CM | POA: Diagnosis not present

## 2022-03-24 DIAGNOSIS — R1110 Vomiting, unspecified: Secondary | ICD-10-CM | POA: Diagnosis not present

## 2022-03-24 DIAGNOSIS — Z87442 Personal history of urinary calculi: Secondary | ICD-10-CM | POA: Diagnosis not present

## 2022-03-24 DIAGNOSIS — H8110 Benign paroxysmal vertigo, unspecified ear: Secondary | ICD-10-CM | POA: Diagnosis not present

## 2022-03-24 DIAGNOSIS — M8580 Other specified disorders of bone density and structure, unspecified site: Secondary | ICD-10-CM | POA: Diagnosis not present

## 2022-03-24 DIAGNOSIS — I129 Hypertensive chronic kidney disease with stage 1 through stage 4 chronic kidney disease, or unspecified chronic kidney disease: Secondary | ICD-10-CM | POA: Diagnosis not present

## 2022-03-24 DIAGNOSIS — N1831 Chronic kidney disease, stage 3a: Secondary | ICD-10-CM | POA: Diagnosis not present

## 2022-03-24 DIAGNOSIS — R1310 Dysphagia, unspecified: Secondary | ICD-10-CM | POA: Diagnosis not present

## 2022-03-24 DIAGNOSIS — B029 Zoster without complications: Secondary | ICD-10-CM | POA: Diagnosis not present

## 2022-03-24 NOTE — Telephone Encounter (Signed)
Randall Hiss dropped off Supplement Order forms to be filled out by Dr. Nicki Reaper. Forms will be upfront in color folder

## 2022-03-25 DIAGNOSIS — R1110 Vomiting, unspecified: Secondary | ICD-10-CM | POA: Diagnosis not present

## 2022-03-25 DIAGNOSIS — Z9181 History of falling: Secondary | ICD-10-CM | POA: Diagnosis not present

## 2022-03-25 DIAGNOSIS — Z7982 Long term (current) use of aspirin: Secondary | ICD-10-CM | POA: Diagnosis not present

## 2022-03-25 DIAGNOSIS — H8110 Benign paroxysmal vertigo, unspecified ear: Secondary | ICD-10-CM | POA: Diagnosis not present

## 2022-03-25 DIAGNOSIS — Z8616 Personal history of COVID-19: Secondary | ICD-10-CM | POA: Diagnosis not present

## 2022-03-25 DIAGNOSIS — I129 Hypertensive chronic kidney disease with stage 1 through stage 4 chronic kidney disease, or unspecified chronic kidney disease: Secondary | ICD-10-CM | POA: Diagnosis not present

## 2022-03-25 DIAGNOSIS — N1831 Chronic kidney disease, stage 3a: Secondary | ICD-10-CM | POA: Diagnosis not present

## 2022-03-25 DIAGNOSIS — E1122 Type 2 diabetes mellitus with diabetic chronic kidney disease: Secondary | ICD-10-CM | POA: Diagnosis not present

## 2022-03-25 DIAGNOSIS — B029 Zoster without complications: Secondary | ICD-10-CM | POA: Diagnosis not present

## 2022-03-25 DIAGNOSIS — M8580 Other specified disorders of bone density and structure, unspecified site: Secondary | ICD-10-CM | POA: Diagnosis not present

## 2022-03-25 DIAGNOSIS — R1310 Dysphagia, unspecified: Secondary | ICD-10-CM | POA: Diagnosis not present

## 2022-03-25 DIAGNOSIS — R251 Tremor, unspecified: Secondary | ICD-10-CM | POA: Diagnosis not present

## 2022-03-25 DIAGNOSIS — E7800 Pure hypercholesterolemia, unspecified: Secondary | ICD-10-CM | POA: Diagnosis not present

## 2022-03-25 DIAGNOSIS — Z87442 Personal history of urinary calculi: Secondary | ICD-10-CM | POA: Diagnosis not present

## 2022-03-27 ENCOUNTER — Ambulatory Visit (INDEPENDENT_AMBULATORY_CARE_PROVIDER_SITE_OTHER): Payer: Medicare Other | Admitting: Internal Medicine

## 2022-03-27 ENCOUNTER — Encounter: Payer: Self-pay | Admitting: Internal Medicine

## 2022-03-27 DIAGNOSIS — Z79899 Other long term (current) drug therapy: Secondary | ICD-10-CM

## 2022-03-27 DIAGNOSIS — E7800 Pure hypercholesterolemia, unspecified: Secondary | ICD-10-CM

## 2022-03-27 DIAGNOSIS — B029 Zoster without complications: Secondary | ICD-10-CM

## 2022-03-27 DIAGNOSIS — R739 Hyperglycemia, unspecified: Secondary | ICD-10-CM | POA: Diagnosis not present

## 2022-03-27 DIAGNOSIS — Z9181 History of falling: Secondary | ICD-10-CM | POA: Diagnosis not present

## 2022-03-27 DIAGNOSIS — R251 Tremor, unspecified: Secondary | ICD-10-CM | POA: Diagnosis not present

## 2022-03-27 DIAGNOSIS — I10 Essential (primary) hypertension: Secondary | ICD-10-CM | POA: Diagnosis not present

## 2022-03-27 DIAGNOSIS — F439 Reaction to severe stress, unspecified: Secondary | ICD-10-CM

## 2022-03-27 DIAGNOSIS — R634 Abnormal weight loss: Secondary | ICD-10-CM | POA: Diagnosis not present

## 2022-03-27 DIAGNOSIS — R1110 Vomiting, unspecified: Secondary | ICD-10-CM | POA: Diagnosis not present

## 2022-03-27 DIAGNOSIS — N1831 Chronic kidney disease, stage 3a: Secondary | ICD-10-CM | POA: Diagnosis not present

## 2022-03-27 DIAGNOSIS — R42 Dizziness and giddiness: Secondary | ICD-10-CM | POA: Diagnosis not present

## 2022-03-27 DIAGNOSIS — E1122 Type 2 diabetes mellitus with diabetic chronic kidney disease: Secondary | ICD-10-CM | POA: Diagnosis not present

## 2022-03-27 DIAGNOSIS — E1165 Type 2 diabetes mellitus with hyperglycemia: Secondary | ICD-10-CM | POA: Diagnosis not present

## 2022-03-27 DIAGNOSIS — R1310 Dysphagia, unspecified: Secondary | ICD-10-CM | POA: Diagnosis not present

## 2022-03-27 DIAGNOSIS — Z87442 Personal history of urinary calculi: Secondary | ICD-10-CM | POA: Diagnosis not present

## 2022-03-27 DIAGNOSIS — Z8616 Personal history of COVID-19: Secondary | ICD-10-CM | POA: Diagnosis not present

## 2022-03-27 DIAGNOSIS — H8110 Benign paroxysmal vertigo, unspecified ear: Secondary | ICD-10-CM | POA: Diagnosis not present

## 2022-03-27 DIAGNOSIS — M8580 Other specified disorders of bone density and structure, unspecified site: Secondary | ICD-10-CM | POA: Diagnosis not present

## 2022-03-27 DIAGNOSIS — Z7982 Long term (current) use of aspirin: Secondary | ICD-10-CM | POA: Diagnosis not present

## 2022-03-27 DIAGNOSIS — I129 Hypertensive chronic kidney disease with stage 1 through stage 4 chronic kidney disease, or unspecified chronic kidney disease: Secondary | ICD-10-CM | POA: Diagnosis not present

## 2022-03-27 NOTE — Progress Notes (Signed)
Patient ID: Kara Dixon, female   DOB: 06/21/1936, 86 y.o.   MRN: [id number]   Subjective:    Patient ID: Kara Dixon, female    DOB: July 13, 1936, 86 y.o.   MRN: [id number]   Patient here for a scheduled follow up. Marland Kitchen   HPI Here to follow up regarding dizziness, weight loss and her blood pressure as well as increased anxiety.  She is accompanied by her daughter.  History obtained from both of them.  She is doing better.  Feels better.  Appetite is better.  Controlling anxiety.  Not needing buspar.  Had started PT.  On hold now, due to no PT availability.  Discussed need to continue exercises shown.     Past Medical History:  Diagnosis Date   Cancer (HCC)    skin   Hypercholesterolemia    Hypertension    Nephrolithiasis    Osteopenia    Vitamin D deficiency    Past Surgical History:  Procedure Laterality Date   CATARACT EXTRACTION W/PHACO Right 02/11/2021   Procedure: CATARACT EXTRACTION PHACO AND INTRAOCULAR LENS PLACEMENT (IOC) RIGHT;  Surgeon: Galen Manila, MD;  Location: [facility];  Service: Ophthalmology;  Laterality: Right;  CDE15.19 01:16.1 minutes   CATARACT EXTRACTION W/PHACO Left 02/25/2021   Procedure: CATARACT EXTRACTION PHACO AND INTRAOCULAR LENS PLACEMENT (IOC) LEFT;  Surgeon: Galen Manila, MD;  Location: [facility];  Service: Ophthalmology;  Laterality: Left;  18.25 01:23.4   cyst removed under tongue  age 86   EYE SURGERY  02/2017   EYE LID; care everywhere   Family History  Problem Relation Age of Onset   Stroke Mother    Hypertension Mother    Fibromyalgia Sister    Breast cancer Neg Hx    Colon cancer Neg Hx    Social History   Socioeconomic History   Marital status: Married    Spouse name: Not on file   Number of children: 1   Years of education: Not on file   Highest education level: Not on file  Occupational History   Not on file  Tobacco Use   Smoking status: Never   Smokeless tobacco: Never  Vaping Use   Vaping  Use: Never used  Substance and Sexual Activity   Alcohol use: No    Alcohol/week: 0.0 standard drinks of alcohol   Drug use: No   Sexual activity: Not Currently  Other Topics Concern   Not on file  Social History Narrative   Not on file   Social Determinants of Health   Financial Resource Strain: Low Risk  (05/12/2021)   Overall Financial Resource Strain (CARDIA)    Difficulty of Paying Living Expenses: Not hard at all  Food Insecurity: No Food Insecurity (05/12/2021)   Hunger Vital Sign    Worried About Running Out of Food in the Last Year: Never true    Ran Out of Food in the Last Year: Never true  Transportation Needs: No Transportation Needs (05/12/2021)   PRAPARE - Administrator, Civil Service (Medical): No    Lack of Transportation (Non-Medical): No  Physical Activity: Unknown (05/12/2021)   Exercise Vital Sign    Days of Exercise per Week: 0 days    Minutes of Exercise per Session: Not on file  Stress: No Stress Concern Present (05/12/2021)   [employer] of Occupational Health - Occupational Stress Questionnaire    Feeling of Stress : Not at all  Social Connections: Unknown (05/12/2021)   Social Connection and Isolation  Panel [NHANES]    Frequency of Communication with Friends and Family: More than three times a week    Frequency of Social Gatherings with Friends and Family: Not on file    Attends Religious Services: Not on file    Active Member of Clubs or Organizations: Not on file    Attends Banker Meetings: Not on file    Marital Status: Married     Review of Systems  Constitutional:  Negative for appetite change and unexpected weight change.       Weight up one pound.  Appetite better.   HENT:  Negative for congestion and sinus pressure.   Respiratory:  Negative for cough, chest tightness and shortness of breath.   Cardiovascular:  Negative for chest pain, palpitations and leg swelling.  Gastrointestinal:  Negative for abdominal pain,  diarrhea, nausea and vomiting.  Genitourinary:  Negative for difficulty urinating and dysuria.  Musculoskeletal:  Negative for joint swelling and myalgias.  Skin:  Negative for color change and rash.  Neurological:  Negative for headaches.       Dizziness is better.  Some pain - with palpation - top of neck (left posterior)  Psychiatric/Behavioral:  Negative for agitation and dysphoric mood.        Objective:     BP 116/70 (BP Location: Left Arm, Patient Position: Sitting, Cuff Size: Small)   Pulse 62   Temp 98 F (36.7 C) (Temporal)   Resp 16   Ht 5' (1.524 m)   Wt 110 lb (49.9 kg)   SpO2 97%   BMI 21.48 kg/m  Wt Readings from Last 3 Encounters:  03/27/22 110 lb (49.9 kg)  03/11/22 109 lb 12.8 oz (49.8 kg)  02/19/22 108 lb 3.2 oz (49.1 kg)    Physical Exam Vitals reviewed.  Constitutional:      General: She is not in acute distress.    Appearance: Normal appearance.  HENT:     Head: Normocephalic and atraumatic.     Right Ear: External ear normal.     Left Ear: External ear normal.  Eyes:     General: No scleral icterus.       Right eye: No discharge.        Left eye: No discharge.     Conjunctiva/sclera: Conjunctivae normal.  Neck:     Thyroid: No thyromegaly.  Cardiovascular:     Rate and Rhythm: Normal rate and regular rhythm.  Pulmonary:     Effort: No respiratory distress.     Breath sounds: Normal breath sounds. No wheezing.  Abdominal:     General: Bowel sounds are normal.     Palpations: Abdomen is soft.     Tenderness: There is no abdominal tenderness.  Musculoskeletal:        General: No swelling or tenderness.     Cervical back: Neck supple. No tenderness.  Lymphadenopathy:     Cervical: No cervical adenopathy.  Skin:    Findings: No erythema or rash.  Neurological:     Mental Status: She is alert.  Psychiatric:        Mood and Affect: Mood normal.        Behavior: Behavior normal.      Outpatient Encounter Medications as of 03/27/2022   Medication Sig   aspirin 81 MG tablet Take 81 mg by mouth daily.   atenolol (TENORMIN) 50 MG tablet One tablet q day   busPIRone (BUSPAR) 5 MG tablet Take 1 tablet (5 mg total) by mouth  daily as needed.   fexofenadine (ALLEGRA) 180 MG tablet Take 180 mg by mouth as needed.    lactase (LACTAID) 3000 UNITS tablet Take 1 tablet by mouth as needed.   ondansetron (ZOFRAN-ODT) 4 MG disintegrating tablet Take 1 tablet (4 mg total) by mouth every 8 (eight) hours as needed for nausea or vomiting.   rosuvastatin (CRESTOR) 10 MG tablet TAKE 1 TABLET BY MOUTH EVERY DAY   sertraline (ZOLOFT) 50 MG tablet TAKE ONE-HALF TABLET BY MOUTH ONCE DAILY FOR 10 DAYS, THEN INCREASE TO 1 TABLET DAILY   No facility-administered encounter medications on file as of 03/27/2022.     Lab Results  Component Value Date   WBC 10.9 (H) 02/23/2022   HGB 13.4 02/23/2022   HCT 40.1 02/23/2022   PLT 234.0 02/23/2022   GLUCOSE 120 (H) 02/23/2022   CHOL 104 12/29/2021   TRIG 107.0 12/29/2021   HDL 45.40 12/29/2021   LDLDIRECT 139.4 05/12/2013   LDLCALC 37 12/29/2021   ALT 11 02/02/2022   AST 20 02/02/2022   NA 137 02/23/2022   K 4.3 02/23/2022   CL 102 02/23/2022   CREATININE 0.94 02/23/2022   BUN 16 02/23/2022   CO2 29 02/23/2022   TSH 3.52 12/29/2021   HGBA1C 5.9 12/29/2021   HGBA1C 5.9 12/29/2021   MICROALBUR 3.5 (H) 12/29/2021    MR Brain W Wo Contrast  Result Date: 03/04/2022 CLINICAL DATA:  Right history: Tremor. Dizziness. Dizziness, nonspecific. Additional history provided by scanning technologist: Vertigo and confusion for 3 months. EXAM: MRI HEAD WITHOUT AND WITH CONTRAST TECHNIQUE: Multiplanar, multiecho pulse sequences of the brain and surrounding structures were obtained without and with intravenous contrast. CONTRAST:  5mL GADAVIST GADOBUTROL 1 MMOL/ML IV SOLN COMPARISON:  No pertinent prior exams available for comparison. FINDINGS: Brain: No age advanced or lobar predominant parenchymal atrophy.  Multifocal T2 FLAIR hyperintense signal abnormality within the cerebral white matter and pons, nonspecific but compatible with mild chronic small vessel ischemic disease. Prominent perivascular space within the inferior right basal ganglia. 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 There is no acute infarct. No evidence of an intracranial mass. No chronic intracranial blood products. No extra-axial fluid collection. No midline shift. No pathologic intracranial enhancement identified. Vascular: Maintained flow voids within the proximal large arterial vessels. Skull and upper cervical spine: No focal suspicious marrow lesion. Sinuses/Orbits: No mass or acute finding within the imaged orbits. Prior bilateral ocular lens replacement. Mild mucosal thickening within the bilateral ethmoid, right sphenoid and bilateral maxillary sinuses. IMPRESSION: No evidence of acute intracranial abnormality. Mild chronic small vessel ischemic changes within the cerebral white matter and pons. 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 Mild paranasal sinus mucosal thickening. Electronically Signed   By: Jackey Loge D.O.   On: 03/04/2022 14:43       Assessment & Plan:   Problem List Items Addressed This Visit     CKD (chronic kidney disease) stage 3, GFR 30-59 ml/min (HCC)    Avoid antiinflammatories.  Stay hydrated.  Follow metabolic panel.       Current use of estrogen therapy    Tapering dose of estrogen.  Appears to be tolerating.  Follow.       Dizziness    Recently evaluated for dizziness.  Was evaluated at Urgent Care 10/08/21 - after waking up to go to bathroom and felt like room was spinning. Position changes worsened.  Associated nausea.  In urgent care, EKG - no acute  changes. Prescription given for zofran.  Reevaluated 10/15/21 - Rennie Plowman) - diagnosed vertigo - improved. Was instructed at that time to decrease atenolol and was  given rx for antivert.  She did not take antivert and it appears she decreased amlodipine instead of atenolol.  Given dizziness more positional at that time and had described the room spinning, was referred to ENT for further evaluation. Has not seen ENT to date.  MRI no acute abnormality.  Off lorazepam.  Have adjusted blood pressure medication.  Off amlodipine.  Blood pressure doing better.  Feels better. Overall appears to be doing better. Will need to continue with PT.       Hypercholesterolemia    On crestor. Follow lipid panel and liver function tests.   Lab Results  Component Value Date   CHOL 104 12/29/2021   HDL 45.40 12/29/2021   LDLCALC 37 12/29/2021   LDLDIRECT 139.4 05/12/2013   TRIG 107.0 12/29/2021   CHOLHDL 2 12/29/2021       Hyperglycemia    Low carb diet and exercise.  Follow met b and a1c.       Hypertension    Blood pressure doing well on atenolol.  Remain off amlodipine.  Follow pressures.  Follow metabolic panel.       Shingles    Overall pain is better.  Follow.       Stress    Not needing buspar.  On zoloft.  Daughter and Ms Lance feel she is doing well on this medication.  Follow.       Tremor    Described worsening tremor.  Discussed further neurology w/up and evaluation.  She is in agreement. Given tremor, will have OT evaluate and treat.  Referral has already been placed for neurology. Has appt in 05/2022.  See if OT can work with her to help with feeding and fine motor skills.       Type 2 diabetes mellitus with hyperglycemia (HCC)    Appetite is better.  Follow met b and a1c.       Weight loss    Weight up one pound from last check.  Appetite has improved.          Dale Emmet, MD

## 2022-03-29 ENCOUNTER — Telehealth: Payer: Self-pay | Admitting: Internal Medicine

## 2022-03-29 ENCOUNTER — Encounter: Payer: Self-pay | Admitting: Internal Medicine

## 2022-03-29 NOTE — Telephone Encounter (Signed)
We received a notification from her home health that her PT was on hold secondary to not having a therapist to go to her home.  Please contact her home health agency and see when they feel PT will resume.  Also, I need to add OT to the orders to help her with fine motor skills.  (Feeding, etc).  Pt with tremor and having difficulty.

## 2022-03-29 NOTE — Assessment & Plan Note (Signed)
On crestor. Follow lipid panel and liver function tests.   Lab Results  Component Value Date   CHOL 104 12/29/2021   HDL 45.40 12/29/2021   LDLCALC 37 12/29/2021   LDLDIRECT 139.4 05/12/2013   TRIG 107.0 12/29/2021   CHOLHDL 2 12/29/2021

## 2022-03-30 ENCOUNTER — Other Ambulatory Visit: Payer: Self-pay | Admitting: Internal Medicine

## 2022-03-30 ENCOUNTER — Other Ambulatory Visit: Payer: Self-pay

## 2022-03-30 DIAGNOSIS — R251 Tremor, unspecified: Secondary | ICD-10-CM | POA: Diagnosis not present

## 2022-03-30 DIAGNOSIS — H8110 Benign paroxysmal vertigo, unspecified ear: Secondary | ICD-10-CM | POA: Diagnosis not present

## 2022-03-30 DIAGNOSIS — Z7982 Long term (current) use of aspirin: Secondary | ICD-10-CM | POA: Diagnosis not present

## 2022-03-30 DIAGNOSIS — R531 Weakness: Secondary | ICD-10-CM

## 2022-03-30 DIAGNOSIS — B029 Zoster without complications: Secondary | ICD-10-CM | POA: Diagnosis not present

## 2022-03-30 DIAGNOSIS — Z8616 Personal history of COVID-19: Secondary | ICD-10-CM | POA: Diagnosis not present

## 2022-03-30 DIAGNOSIS — Z87442 Personal history of urinary calculi: Secondary | ICD-10-CM | POA: Diagnosis not present

## 2022-03-30 DIAGNOSIS — R1110 Vomiting, unspecified: Secondary | ICD-10-CM | POA: Diagnosis not present

## 2022-03-30 DIAGNOSIS — N1831 Chronic kidney disease, stage 3a: Secondary | ICD-10-CM | POA: Diagnosis not present

## 2022-03-30 DIAGNOSIS — M8580 Other specified disorders of bone density and structure, unspecified site: Secondary | ICD-10-CM | POA: Diagnosis not present

## 2022-03-30 DIAGNOSIS — R1310 Dysphagia, unspecified: Secondary | ICD-10-CM | POA: Diagnosis not present

## 2022-03-30 DIAGNOSIS — I10 Essential (primary) hypertension: Secondary | ICD-10-CM

## 2022-03-30 DIAGNOSIS — E1122 Type 2 diabetes mellitus with diabetic chronic kidney disease: Secondary | ICD-10-CM | POA: Diagnosis not present

## 2022-03-30 DIAGNOSIS — E7800 Pure hypercholesterolemia, unspecified: Secondary | ICD-10-CM | POA: Diagnosis not present

## 2022-03-30 DIAGNOSIS — I129 Hypertensive chronic kidney disease with stage 1 through stage 4 chronic kidney disease, or unspecified chronic kidney disease: Secondary | ICD-10-CM | POA: Diagnosis not present

## 2022-03-30 DIAGNOSIS — Z9181 History of falling: Secondary | ICD-10-CM | POA: Diagnosis not present

## 2022-03-31 DIAGNOSIS — M8580 Other specified disorders of bone density and structure, unspecified site: Secondary | ICD-10-CM | POA: Diagnosis not present

## 2022-03-31 DIAGNOSIS — Z8616 Personal history of COVID-19: Secondary | ICD-10-CM | POA: Diagnosis not present

## 2022-03-31 DIAGNOSIS — E7800 Pure hypercholesterolemia, unspecified: Secondary | ICD-10-CM | POA: Diagnosis not present

## 2022-03-31 DIAGNOSIS — Z87442 Personal history of urinary calculi: Secondary | ICD-10-CM | POA: Diagnosis not present

## 2022-03-31 DIAGNOSIS — B029 Zoster without complications: Secondary | ICD-10-CM | POA: Diagnosis not present

## 2022-03-31 DIAGNOSIS — R1110 Vomiting, unspecified: Secondary | ICD-10-CM | POA: Diagnosis not present

## 2022-03-31 DIAGNOSIS — H8110 Benign paroxysmal vertigo, unspecified ear: Secondary | ICD-10-CM | POA: Diagnosis not present

## 2022-03-31 DIAGNOSIS — R1310 Dysphagia, unspecified: Secondary | ICD-10-CM | POA: Diagnosis not present

## 2022-03-31 DIAGNOSIS — N1831 Chronic kidney disease, stage 3a: Secondary | ICD-10-CM | POA: Diagnosis not present

## 2022-03-31 DIAGNOSIS — R251 Tremor, unspecified: Secondary | ICD-10-CM | POA: Diagnosis not present

## 2022-03-31 DIAGNOSIS — Z9181 History of falling: Secondary | ICD-10-CM | POA: Diagnosis not present

## 2022-03-31 DIAGNOSIS — E1122 Type 2 diabetes mellitus with diabetic chronic kidney disease: Secondary | ICD-10-CM | POA: Diagnosis not present

## 2022-03-31 DIAGNOSIS — Z7982 Long term (current) use of aspirin: Secondary | ICD-10-CM | POA: Diagnosis not present

## 2022-03-31 DIAGNOSIS — I129 Hypertensive chronic kidney disease with stage 1 through stage 4 chronic kidney disease, or unspecified chronic kidney disease: Secondary | ICD-10-CM | POA: Diagnosis not present

## 2022-04-01 DIAGNOSIS — B029 Zoster without complications: Secondary | ICD-10-CM | POA: Diagnosis not present

## 2022-04-01 DIAGNOSIS — E1122 Type 2 diabetes mellitus with diabetic chronic kidney disease: Secondary | ICD-10-CM | POA: Diagnosis not present

## 2022-04-01 DIAGNOSIS — Z87442 Personal history of urinary calculi: Secondary | ICD-10-CM | POA: Diagnosis not present

## 2022-04-01 DIAGNOSIS — M8580 Other specified disorders of bone density and structure, unspecified site: Secondary | ICD-10-CM | POA: Diagnosis not present

## 2022-04-01 DIAGNOSIS — R251 Tremor, unspecified: Secondary | ICD-10-CM | POA: Diagnosis not present

## 2022-04-01 DIAGNOSIS — Z7982 Long term (current) use of aspirin: Secondary | ICD-10-CM | POA: Diagnosis not present

## 2022-04-01 DIAGNOSIS — H8110 Benign paroxysmal vertigo, unspecified ear: Secondary | ICD-10-CM | POA: Diagnosis not present

## 2022-04-01 DIAGNOSIS — I129 Hypertensive chronic kidney disease with stage 1 through stage 4 chronic kidney disease, or unspecified chronic kidney disease: Secondary | ICD-10-CM | POA: Diagnosis not present

## 2022-04-01 DIAGNOSIS — E7800 Pure hypercholesterolemia, unspecified: Secondary | ICD-10-CM | POA: Diagnosis not present

## 2022-04-01 DIAGNOSIS — Z9181 History of falling: Secondary | ICD-10-CM | POA: Diagnosis not present

## 2022-04-01 DIAGNOSIS — R1110 Vomiting, unspecified: Secondary | ICD-10-CM | POA: Diagnosis not present

## 2022-04-01 DIAGNOSIS — N1831 Chronic kidney disease, stage 3a: Secondary | ICD-10-CM | POA: Diagnosis not present

## 2022-04-01 DIAGNOSIS — Z8616 Personal history of COVID-19: Secondary | ICD-10-CM | POA: Diagnosis not present

## 2022-04-01 DIAGNOSIS — R1310 Dysphagia, unspecified: Secondary | ICD-10-CM | POA: Diagnosis not present

## 2022-04-06 DIAGNOSIS — B029 Zoster without complications: Secondary | ICD-10-CM | POA: Diagnosis not present

## 2022-04-06 DIAGNOSIS — R1310 Dysphagia, unspecified: Secondary | ICD-10-CM | POA: Diagnosis not present

## 2022-04-06 DIAGNOSIS — R251 Tremor, unspecified: Secondary | ICD-10-CM | POA: Diagnosis not present

## 2022-04-06 DIAGNOSIS — E1122 Type 2 diabetes mellitus with diabetic chronic kidney disease: Secondary | ICD-10-CM | POA: Diagnosis not present

## 2022-04-06 DIAGNOSIS — E7800 Pure hypercholesterolemia, unspecified: Secondary | ICD-10-CM | POA: Diagnosis not present

## 2022-04-06 DIAGNOSIS — R1110 Vomiting, unspecified: Secondary | ICD-10-CM | POA: Diagnosis not present

## 2022-04-06 DIAGNOSIS — I129 Hypertensive chronic kidney disease with stage 1 through stage 4 chronic kidney disease, or unspecified chronic kidney disease: Secondary | ICD-10-CM | POA: Diagnosis not present

## 2022-04-06 DIAGNOSIS — Z8616 Personal history of COVID-19: Secondary | ICD-10-CM | POA: Diagnosis not present

## 2022-04-06 DIAGNOSIS — Z9181 History of falling: Secondary | ICD-10-CM | POA: Diagnosis not present

## 2022-04-06 DIAGNOSIS — Z7982 Long term (current) use of aspirin: Secondary | ICD-10-CM | POA: Diagnosis not present

## 2022-04-06 DIAGNOSIS — H8110 Benign paroxysmal vertigo, unspecified ear: Secondary | ICD-10-CM | POA: Diagnosis not present

## 2022-04-06 DIAGNOSIS — N1831 Chronic kidney disease, stage 3a: Secondary | ICD-10-CM | POA: Diagnosis not present

## 2022-04-06 DIAGNOSIS — M8580 Other specified disorders of bone density and structure, unspecified site: Secondary | ICD-10-CM | POA: Diagnosis not present

## 2022-04-06 DIAGNOSIS — Z87442 Personal history of urinary calculi: Secondary | ICD-10-CM | POA: Diagnosis not present

## 2022-04-08 DIAGNOSIS — Z9181 History of falling: Secondary | ICD-10-CM | POA: Diagnosis not present

## 2022-04-08 DIAGNOSIS — R1110 Vomiting, unspecified: Secondary | ICD-10-CM | POA: Diagnosis not present

## 2022-04-08 DIAGNOSIS — Z7982 Long term (current) use of aspirin: Secondary | ICD-10-CM | POA: Diagnosis not present

## 2022-04-08 DIAGNOSIS — R1310 Dysphagia, unspecified: Secondary | ICD-10-CM | POA: Diagnosis not present

## 2022-04-08 DIAGNOSIS — B029 Zoster without complications: Secondary | ICD-10-CM | POA: Diagnosis not present

## 2022-04-08 DIAGNOSIS — I129 Hypertensive chronic kidney disease with stage 1 through stage 4 chronic kidney disease, or unspecified chronic kidney disease: Secondary | ICD-10-CM | POA: Diagnosis not present

## 2022-04-08 DIAGNOSIS — N1831 Chronic kidney disease, stage 3a: Secondary | ICD-10-CM | POA: Diagnosis not present

## 2022-04-08 DIAGNOSIS — H8110 Benign paroxysmal vertigo, unspecified ear: Secondary | ICD-10-CM | POA: Diagnosis not present

## 2022-04-08 DIAGNOSIS — E7800 Pure hypercholesterolemia, unspecified: Secondary | ICD-10-CM | POA: Diagnosis not present

## 2022-04-08 DIAGNOSIS — R251 Tremor, unspecified: Secondary | ICD-10-CM | POA: Diagnosis not present

## 2022-04-08 DIAGNOSIS — E1122 Type 2 diabetes mellitus with diabetic chronic kidney disease: Secondary | ICD-10-CM | POA: Diagnosis not present

## 2022-04-08 DIAGNOSIS — Z8616 Personal history of COVID-19: Secondary | ICD-10-CM | POA: Diagnosis not present

## 2022-04-08 DIAGNOSIS — Z87442 Personal history of urinary calculi: Secondary | ICD-10-CM | POA: Diagnosis not present

## 2022-04-08 DIAGNOSIS — M8580 Other specified disorders of bone density and structure, unspecified site: Secondary | ICD-10-CM | POA: Diagnosis not present

## 2022-04-10 DIAGNOSIS — E7800 Pure hypercholesterolemia, unspecified: Secondary | ICD-10-CM | POA: Diagnosis not present

## 2022-04-10 DIAGNOSIS — B029 Zoster without complications: Secondary | ICD-10-CM | POA: Diagnosis not present

## 2022-04-10 DIAGNOSIS — H8110 Benign paroxysmal vertigo, unspecified ear: Secondary | ICD-10-CM | POA: Diagnosis not present

## 2022-04-10 DIAGNOSIS — M8580 Other specified disorders of bone density and structure, unspecified site: Secondary | ICD-10-CM | POA: Diagnosis not present

## 2022-04-10 DIAGNOSIS — I129 Hypertensive chronic kidney disease with stage 1 through stage 4 chronic kidney disease, or unspecified chronic kidney disease: Secondary | ICD-10-CM | POA: Diagnosis not present

## 2022-04-10 DIAGNOSIS — R251 Tremor, unspecified: Secondary | ICD-10-CM | POA: Diagnosis not present

## 2022-04-10 DIAGNOSIS — R1110 Vomiting, unspecified: Secondary | ICD-10-CM | POA: Diagnosis not present

## 2022-04-10 DIAGNOSIS — Z8616 Personal history of COVID-19: Secondary | ICD-10-CM | POA: Diagnosis not present

## 2022-04-10 DIAGNOSIS — N1831 Chronic kidney disease, stage 3a: Secondary | ICD-10-CM | POA: Diagnosis not present

## 2022-04-10 DIAGNOSIS — R1310 Dysphagia, unspecified: Secondary | ICD-10-CM | POA: Diagnosis not present

## 2022-04-10 DIAGNOSIS — Z7982 Long term (current) use of aspirin: Secondary | ICD-10-CM | POA: Diagnosis not present

## 2022-04-10 DIAGNOSIS — Z87442 Personal history of urinary calculi: Secondary | ICD-10-CM | POA: Diagnosis not present

## 2022-04-10 DIAGNOSIS — E1122 Type 2 diabetes mellitus with diabetic chronic kidney disease: Secondary | ICD-10-CM | POA: Diagnosis not present

## 2022-04-10 DIAGNOSIS — Z9181 History of falling: Secondary | ICD-10-CM | POA: Diagnosis not present

## 2022-04-14 DIAGNOSIS — Z87442 Personal history of urinary calculi: Secondary | ICD-10-CM | POA: Diagnosis not present

## 2022-04-14 DIAGNOSIS — I129 Hypertensive chronic kidney disease with stage 1 through stage 4 chronic kidney disease, or unspecified chronic kidney disease: Secondary | ICD-10-CM | POA: Diagnosis not present

## 2022-04-14 DIAGNOSIS — H8110 Benign paroxysmal vertigo, unspecified ear: Secondary | ICD-10-CM | POA: Diagnosis not present

## 2022-04-14 DIAGNOSIS — M8580 Other specified disorders of bone density and structure, unspecified site: Secondary | ICD-10-CM | POA: Diagnosis not present

## 2022-04-14 DIAGNOSIS — E7800 Pure hypercholesterolemia, unspecified: Secondary | ICD-10-CM | POA: Diagnosis not present

## 2022-04-14 DIAGNOSIS — R251 Tremor, unspecified: Secondary | ICD-10-CM | POA: Diagnosis not present

## 2022-04-14 DIAGNOSIS — Z9181 History of falling: Secondary | ICD-10-CM | POA: Diagnosis not present

## 2022-04-14 DIAGNOSIS — Z8616 Personal history of COVID-19: Secondary | ICD-10-CM | POA: Diagnosis not present

## 2022-04-14 DIAGNOSIS — B029 Zoster without complications: Secondary | ICD-10-CM | POA: Diagnosis not present

## 2022-04-14 DIAGNOSIS — R1110 Vomiting, unspecified: Secondary | ICD-10-CM | POA: Diagnosis not present

## 2022-04-14 DIAGNOSIS — R1310 Dysphagia, unspecified: Secondary | ICD-10-CM | POA: Diagnosis not present

## 2022-04-14 DIAGNOSIS — E1122 Type 2 diabetes mellitus with diabetic chronic kidney disease: Secondary | ICD-10-CM | POA: Diagnosis not present

## 2022-04-14 DIAGNOSIS — N1831 Chronic kidney disease, stage 3a: Secondary | ICD-10-CM | POA: Diagnosis not present

## 2022-04-14 DIAGNOSIS — Z7982 Long term (current) use of aspirin: Secondary | ICD-10-CM | POA: Diagnosis not present

## 2022-04-15 DIAGNOSIS — R1310 Dysphagia, unspecified: Secondary | ICD-10-CM | POA: Diagnosis not present

## 2022-04-15 DIAGNOSIS — B029 Zoster without complications: Secondary | ICD-10-CM | POA: Diagnosis not present

## 2022-04-15 DIAGNOSIS — H8110 Benign paroxysmal vertigo, unspecified ear: Secondary | ICD-10-CM | POA: Diagnosis not present

## 2022-04-15 DIAGNOSIS — R251 Tremor, unspecified: Secondary | ICD-10-CM | POA: Diagnosis not present

## 2022-04-15 DIAGNOSIS — M8580 Other specified disorders of bone density and structure, unspecified site: Secondary | ICD-10-CM | POA: Diagnosis not present

## 2022-04-15 DIAGNOSIS — Z7982 Long term (current) use of aspirin: Secondary | ICD-10-CM | POA: Diagnosis not present

## 2022-04-15 DIAGNOSIS — R1110 Vomiting, unspecified: Secondary | ICD-10-CM | POA: Diagnosis not present

## 2022-04-15 DIAGNOSIS — E7800 Pure hypercholesterolemia, unspecified: Secondary | ICD-10-CM | POA: Diagnosis not present

## 2022-04-15 DIAGNOSIS — I129 Hypertensive chronic kidney disease with stage 1 through stage 4 chronic kidney disease, or unspecified chronic kidney disease: Secondary | ICD-10-CM | POA: Diagnosis not present

## 2022-04-15 DIAGNOSIS — N1831 Chronic kidney disease, stage 3a: Secondary | ICD-10-CM | POA: Diagnosis not present

## 2022-04-15 DIAGNOSIS — E1122 Type 2 diabetes mellitus with diabetic chronic kidney disease: Secondary | ICD-10-CM | POA: Diagnosis not present

## 2022-04-15 DIAGNOSIS — Z9181 History of falling: Secondary | ICD-10-CM | POA: Diagnosis not present

## 2022-04-15 DIAGNOSIS — Z87442 Personal history of urinary calculi: Secondary | ICD-10-CM | POA: Diagnosis not present

## 2022-04-15 DIAGNOSIS — Z8616 Personal history of COVID-19: Secondary | ICD-10-CM | POA: Diagnosis not present

## 2022-04-16 DIAGNOSIS — H8111 Benign paroxysmal vertigo, right ear: Secondary | ICD-10-CM | POA: Diagnosis not present

## 2022-04-17 DIAGNOSIS — I129 Hypertensive chronic kidney disease with stage 1 through stage 4 chronic kidney disease, or unspecified chronic kidney disease: Secondary | ICD-10-CM | POA: Diagnosis not present

## 2022-04-17 DIAGNOSIS — Z87442 Personal history of urinary calculi: Secondary | ICD-10-CM | POA: Diagnosis not present

## 2022-04-17 DIAGNOSIS — B029 Zoster without complications: Secondary | ICD-10-CM | POA: Diagnosis not present

## 2022-04-17 DIAGNOSIS — M8580 Other specified disorders of bone density and structure, unspecified site: Secondary | ICD-10-CM | POA: Diagnosis not present

## 2022-04-17 DIAGNOSIS — H8110 Benign paroxysmal vertigo, unspecified ear: Secondary | ICD-10-CM | POA: Diagnosis not present

## 2022-04-17 DIAGNOSIS — Z8616 Personal history of COVID-19: Secondary | ICD-10-CM | POA: Diagnosis not present

## 2022-04-17 DIAGNOSIS — N1831 Chronic kidney disease, stage 3a: Secondary | ICD-10-CM | POA: Diagnosis not present

## 2022-04-17 DIAGNOSIS — E1122 Type 2 diabetes mellitus with diabetic chronic kidney disease: Secondary | ICD-10-CM | POA: Diagnosis not present

## 2022-04-17 DIAGNOSIS — E7800 Pure hypercholesterolemia, unspecified: Secondary | ICD-10-CM | POA: Diagnosis not present

## 2022-04-17 DIAGNOSIS — Z9181 History of falling: Secondary | ICD-10-CM | POA: Diagnosis not present

## 2022-04-17 DIAGNOSIS — R251 Tremor, unspecified: Secondary | ICD-10-CM | POA: Diagnosis not present

## 2022-04-17 DIAGNOSIS — R1110 Vomiting, unspecified: Secondary | ICD-10-CM | POA: Diagnosis not present

## 2022-04-17 DIAGNOSIS — R1310 Dysphagia, unspecified: Secondary | ICD-10-CM | POA: Diagnosis not present

## 2022-04-17 DIAGNOSIS — Z7982 Long term (current) use of aspirin: Secondary | ICD-10-CM | POA: Diagnosis not present

## 2022-04-20 DIAGNOSIS — Z8616 Personal history of COVID-19: Secondary | ICD-10-CM | POA: Diagnosis not present

## 2022-04-20 DIAGNOSIS — N1831 Chronic kidney disease, stage 3a: Secondary | ICD-10-CM | POA: Diagnosis not present

## 2022-04-20 DIAGNOSIS — E7800 Pure hypercholesterolemia, unspecified: Secondary | ICD-10-CM | POA: Diagnosis not present

## 2022-04-20 DIAGNOSIS — Z9181 History of falling: Secondary | ICD-10-CM | POA: Diagnosis not present

## 2022-04-20 DIAGNOSIS — R1310 Dysphagia, unspecified: Secondary | ICD-10-CM | POA: Diagnosis not present

## 2022-04-20 DIAGNOSIS — H8110 Benign paroxysmal vertigo, unspecified ear: Secondary | ICD-10-CM | POA: Diagnosis not present

## 2022-04-20 DIAGNOSIS — R251 Tremor, unspecified: Secondary | ICD-10-CM | POA: Diagnosis not present

## 2022-04-20 DIAGNOSIS — I129 Hypertensive chronic kidney disease with stage 1 through stage 4 chronic kidney disease, or unspecified chronic kidney disease: Secondary | ICD-10-CM | POA: Diagnosis not present

## 2022-04-20 DIAGNOSIS — Z87442 Personal history of urinary calculi: Secondary | ICD-10-CM | POA: Diagnosis not present

## 2022-04-20 DIAGNOSIS — Z7982 Long term (current) use of aspirin: Secondary | ICD-10-CM | POA: Diagnosis not present

## 2022-04-20 DIAGNOSIS — M8580 Other specified disorders of bone density and structure, unspecified site: Secondary | ICD-10-CM | POA: Diagnosis not present

## 2022-04-20 DIAGNOSIS — R1110 Vomiting, unspecified: Secondary | ICD-10-CM | POA: Diagnosis not present

## 2022-04-20 DIAGNOSIS — B029 Zoster without complications: Secondary | ICD-10-CM | POA: Diagnosis not present

## 2022-04-20 DIAGNOSIS — E1122 Type 2 diabetes mellitus with diabetic chronic kidney disease: Secondary | ICD-10-CM | POA: Diagnosis not present

## 2022-04-23 DIAGNOSIS — H8110 Benign paroxysmal vertigo, unspecified ear: Secondary | ICD-10-CM | POA: Diagnosis not present

## 2022-04-23 DIAGNOSIS — N1831 Chronic kidney disease, stage 3a: Secondary | ICD-10-CM | POA: Diagnosis not present

## 2022-04-23 DIAGNOSIS — B029 Zoster without complications: Secondary | ICD-10-CM | POA: Diagnosis not present

## 2022-04-23 DIAGNOSIS — Z9181 History of falling: Secondary | ICD-10-CM | POA: Diagnosis not present

## 2022-04-23 DIAGNOSIS — E1122 Type 2 diabetes mellitus with diabetic chronic kidney disease: Secondary | ICD-10-CM | POA: Diagnosis not present

## 2022-04-23 DIAGNOSIS — M8580 Other specified disorders of bone density and structure, unspecified site: Secondary | ICD-10-CM | POA: Diagnosis not present

## 2022-04-23 DIAGNOSIS — Z87442 Personal history of urinary calculi: Secondary | ICD-10-CM | POA: Diagnosis not present

## 2022-04-23 DIAGNOSIS — E7800 Pure hypercholesterolemia, unspecified: Secondary | ICD-10-CM | POA: Diagnosis not present

## 2022-04-23 DIAGNOSIS — I129 Hypertensive chronic kidney disease with stage 1 through stage 4 chronic kidney disease, or unspecified chronic kidney disease: Secondary | ICD-10-CM | POA: Diagnosis not present

## 2022-04-23 DIAGNOSIS — R251 Tremor, unspecified: Secondary | ICD-10-CM | POA: Diagnosis not present

## 2022-04-23 DIAGNOSIS — R1110 Vomiting, unspecified: Secondary | ICD-10-CM | POA: Diagnosis not present

## 2022-04-23 DIAGNOSIS — R1310 Dysphagia, unspecified: Secondary | ICD-10-CM | POA: Diagnosis not present

## 2022-04-23 DIAGNOSIS — H8111 Benign paroxysmal vertigo, right ear: Secondary | ICD-10-CM | POA: Diagnosis not present

## 2022-04-23 DIAGNOSIS — Z8616 Personal history of COVID-19: Secondary | ICD-10-CM | POA: Diagnosis not present

## 2022-04-23 DIAGNOSIS — Z7982 Long term (current) use of aspirin: Secondary | ICD-10-CM | POA: Diagnosis not present

## 2022-04-28 DIAGNOSIS — R251 Tremor, unspecified: Secondary | ICD-10-CM | POA: Diagnosis not present

## 2022-04-28 DIAGNOSIS — N1831 Chronic kidney disease, stage 3a: Secondary | ICD-10-CM | POA: Diagnosis not present

## 2022-04-28 DIAGNOSIS — E1122 Type 2 diabetes mellitus with diabetic chronic kidney disease: Secondary | ICD-10-CM | POA: Diagnosis not present

## 2022-04-28 DIAGNOSIS — E7800 Pure hypercholesterolemia, unspecified: Secondary | ICD-10-CM | POA: Diagnosis not present

## 2022-04-28 DIAGNOSIS — Z7982 Long term (current) use of aspirin: Secondary | ICD-10-CM | POA: Diagnosis not present

## 2022-04-28 DIAGNOSIS — Z9181 History of falling: Secondary | ICD-10-CM | POA: Diagnosis not present

## 2022-04-28 DIAGNOSIS — Z87442 Personal history of urinary calculi: Secondary | ICD-10-CM | POA: Diagnosis not present

## 2022-04-28 DIAGNOSIS — I129 Hypertensive chronic kidney disease with stage 1 through stage 4 chronic kidney disease, or unspecified chronic kidney disease: Secondary | ICD-10-CM | POA: Diagnosis not present

## 2022-04-28 DIAGNOSIS — H8110 Benign paroxysmal vertigo, unspecified ear: Secondary | ICD-10-CM | POA: Diagnosis not present

## 2022-04-28 DIAGNOSIS — R1310 Dysphagia, unspecified: Secondary | ICD-10-CM | POA: Diagnosis not present

## 2022-04-28 DIAGNOSIS — Z8616 Personal history of COVID-19: Secondary | ICD-10-CM | POA: Diagnosis not present

## 2022-04-28 DIAGNOSIS — B029 Zoster without complications: Secondary | ICD-10-CM | POA: Diagnosis not present

## 2022-04-28 DIAGNOSIS — M8580 Other specified disorders of bone density and structure, unspecified site: Secondary | ICD-10-CM | POA: Diagnosis not present

## 2022-04-28 DIAGNOSIS — R1110 Vomiting, unspecified: Secondary | ICD-10-CM | POA: Diagnosis not present

## 2022-05-12 ENCOUNTER — Telehealth: Payer: Self-pay | Admitting: Internal Medicine

## 2022-05-12 NOTE — Telephone Encounter (Signed)
Copied from [place] [phone]. Topic: Medicare AWV >> May 12, 2022 11:05 AM Devoria Glassing wrote: Reason for CRM: Left message for patient to schedule Annual Wellness Visit.  Please schedule with Nurse Health Advisor Denisa O'Brien-Blaney, LPN at [facility]. This appt can be telephone or office visit.  Please call [phone] ask for [facility]

## 2022-05-22 ENCOUNTER — Ambulatory Visit: Payer: Medicare Other | Admitting: Internal Medicine

## 2022-05-24 ENCOUNTER — Other Ambulatory Visit: Payer: Self-pay | Admitting: Family

## 2022-05-24 DIAGNOSIS — R42 Dizziness and giddiness: Secondary | ICD-10-CM

## 2022-06-09 DIAGNOSIS — G20 Parkinson's disease: Secondary | ICD-10-CM | POA: Diagnosis not present

## 2022-06-09 DIAGNOSIS — Z9181 History of falling: Secondary | ICD-10-CM | POA: Diagnosis not present

## 2022-06-09 DIAGNOSIS — G939 Disorder of brain, unspecified: Secondary | ICD-10-CM | POA: Diagnosis not present

## 2022-06-09 DIAGNOSIS — E538 Deficiency of other specified B group vitamins: Secondary | ICD-10-CM | POA: Diagnosis not present

## 2022-06-09 DIAGNOSIS — Z8619 Personal history of other infectious and parasitic diseases: Secondary | ICD-10-CM | POA: Diagnosis not present

## 2022-06-10 ENCOUNTER — Other Ambulatory Visit: Payer: Self-pay | Admitting: Internal Medicine

## 2022-06-16 ENCOUNTER — Other Ambulatory Visit: Payer: Self-pay | Admitting: Internal Medicine

## 2022-06-19 ENCOUNTER — Ambulatory Visit: Payer: Medicare Other | Admitting: Internal Medicine

## 2022-07-10 DIAGNOSIS — Z9181 History of falling: Secondary | ICD-10-CM | POA: Diagnosis not present

## 2022-07-10 DIAGNOSIS — G20A2 Parkinson's disease without dyskinesia, with fluctuations: Secondary | ICD-10-CM | POA: Diagnosis not present

## 2022-07-10 DIAGNOSIS — Z8619 Personal history of other infectious and parasitic diseases: Secondary | ICD-10-CM | POA: Diagnosis not present

## 2022-07-10 DIAGNOSIS — G939 Disorder of brain, unspecified: Secondary | ICD-10-CM | POA: Diagnosis not present

## 2022-07-14 ENCOUNTER — Telehealth: Payer: Self-pay | Admitting: Internal Medicine

## 2022-07-14 NOTE — Telephone Encounter (Signed)
Copied from [place] [phone]. Topic: Medicare AWV >> Jul 14, 2022 11:22 AM Devoria Glassing wrote: Reason for CRM: Left message for patient to schedule Annual Wellness Visit.  Please schedule with Nurse Health Advisor Denisa O'Brien-Blaney, LPN at [facility]. This appt can be telephone or office visit.  Please call [phone] ask for [facility]

## 2022-07-24 ENCOUNTER — Telehealth: Payer: Self-pay

## 2022-07-24 ENCOUNTER — Ambulatory Visit: Payer: Medicare Other

## 2022-07-24 NOTE — Telephone Encounter (Signed)
Unable to reach patient for scheduled AWV. Left voicemail to reschedule.

## 2022-07-27 ENCOUNTER — Ambulatory Visit (INDEPENDENT_AMBULATORY_CARE_PROVIDER_SITE_OTHER): Payer: Medicare Other

## 2022-07-27 VITALS — Ht 60.0 in | Wt 110.0 lb

## 2022-07-27 DIAGNOSIS — Z1231 Encounter for screening mammogram for malignant neoplasm of breast: Secondary | ICD-10-CM

## 2022-07-27 DIAGNOSIS — Z0000 Encounter for general adult medical examination without abnormal findings: Secondary | ICD-10-CM | POA: Diagnosis not present

## 2022-07-27 NOTE — Patient Instructions (Addendum)
Ms. Kara Dixon , Thank you for taking time to come for your Medicare Wellness Visit. I appreciate your ongoing commitment to your health goals. Please review the following plan we discussed and let me know if I can assist you in the future.   These are the goals we discussed:  Goals       Patient Stated     Increase physical activity (pt-stated)      Stay active and use stretch bands for exercise        This is a list of the screening recommended for you and due dates:  Health Maintenance  Topic Date Due   Mammogram  12/17/2021   Eye exam for diabetics  02/11/2022   Hemoglobin A1C  07/01/2022   Flu Shot  08/05/2022*   Complete foot exam   08/05/2022*   COVID-19 Vaccine (4 - Moderna risk series) 08/12/2022*   Tetanus Vaccine  09/04/2022*   Zoster (Shingles) Vaccine (1 of 2) 10/27/2022*   Yearly kidney health urinalysis for diabetes  12/30/2022   Yearly kidney function blood test for diabetes  02/24/2023   Pneumonia Vaccine  Completed   DEXA scan (bone density measurement)  Completed   HPV Vaccine  Aged Out  *Topic was postponed. The date shown is not the original due date.    Advanced directives: End of life planning; Advance aging; Advanced directives discussed.  None complete at this time. Plans to get additional information from office. Copy of current HCPOA/Living Will requested upon completion.  Conditions/risks identified: none new.  Next appointment: Follow up in one year for your annual wellness visit    Preventive Care 65 Years and Older, Female Preventive care refers to lifestyle choices and visits with your health care provider that can promote health and wellness. What does preventive care include? A yearly physical exam. This is also called an annual well check. Dental exams once or twice a year. Routine eye exams. Ask your health care provider how often you should have your eyes checked. Personal lifestyle choices, including: Daily care of your teeth and  gums. Regular physical activity. Eating a healthy diet. Avoiding tobacco and drug use. Limiting alcohol use. Practicing safe sex. Taking low-dose aspirin every day. Taking vitamin and mineral supplements as recommended by your health care provider. What happens during an annual well check? The services and screenings done by your health care provider during your annual well check will depend on your age, overall health, lifestyle risk factors, and family history of disease. Counseling  Your health care provider may ask you questions about your: Alcohol use. Tobacco use. Drug use. Emotional well-being. Home and relationship well-being. Sexual activity. Eating habits. History of falls. Memory and ability to understand (cognition). Work and work Statistician. Reproductive health. Screening  You may have the following tests or measurements: Height, weight, and BMI. Blood pressure. Lipid and cholesterol levels. These may be checked every 5 years, or more frequently if you are over 15 years old. Skin check. Lung cancer screening. You may have this screening every year starting at age 48 if you have a 30-pack-year history of smoking and currently smoke or have quit within the past 15 years. Fecal occult blood test (FOBT) of the stool. You may have this test every year starting at age 67. Flexible sigmoidoscopy or colonoscopy. You may have a sigmoidoscopy every 5 years or a colonoscopy every 10 years starting at age 58. Hepatitis C blood test. Hepatitis B blood test. Sexually transmitted disease (STD) testing. Diabetes screening.  This is done by checking your blood sugar (glucose) after you have not eaten for a while (fasting). You may have this done every 1-3 years. Bone density scan. This is done to screen for osteoporosis. You may have this done starting at age 4. Mammogram. This may be done every 1-2 years. Talk to your health care provider about how often you should have regular  mammograms. Talk with your health care provider about your test results, treatment options, and if necessary, the need for more tests. Vaccines  Your health care provider may recommend certain vaccines, such as: Influenza vaccine. This is recommended every year. Tetanus, diphtheria, and acellular pertussis (Tdap, Td) vaccine. You may need a Td booster every 10 years. Zoster vaccine. You may need this after age 50. Pneumococcal 13-valent conjugate (PCV13) vaccine. One dose is recommended after age 64. Pneumococcal polysaccharide (PPSV23) vaccine. One dose is recommended after age 96. Talk to your health care provider about which screenings and vaccines you need and how often you need them. This information is not intended to replace advice given to you by your health care provider. Make sure you discuss any questions you have with your health care provider. Document Released: 10/18/2015 Document Revised: 06/10/2016 Document Reviewed: 07/23/2015 Elsevier Interactive Patient Education  2017 [place] Prevention in the Home Falls can cause injuries. They can happen to people of all ages. There are many things you can do to make your home safe and to help prevent falls. What can I do on the outside of my home? Regularly fix the edges of walkways and driveways and fix any cracks. Remove anything that might make you trip as you walk through a door, such as a raised step or threshold. Trim any bushes or trees on the path to your home. Use bright outdoor lighting. Clear any walking paths of anything that might make someone trip, such as rocks or tools. Regularly check to see if handrails are loose or broken. Make sure that both sides of any steps have handrails. Any raised decks and porches should have guardrails on the edges. Have any leaves, snow, or ice cleared regularly. Use sand or salt on walking paths during winter. Clean up any spills in your garage right away. This includes oil  or grease spills. What can I do in the bathroom? Use night lights. Install grab bars by the toilet and in the tub and shower. Do not use towel bars as grab bars. Use non-skid mats or decals in the tub or shower. If you need to sit down in the shower, use a plastic, non-slip stool. Keep the floor dry. Clean up any water that spills on the floor as soon as it happens. Remove soap buildup in the tub or shower regularly. Attach bath mats securely with double-sided non-slip rug tape. Do not have throw rugs and other things on the floor that can make you trip. What can I do in the bedroom? Use night lights. Make sure that you have a light by your bed that is easy to reach. Do not use any sheets or blankets that are too big for your bed. They should not hang down onto the floor. Have a firm chair that has side arms. You can use this for support while you get dressed. Do not have throw rugs and other things on the floor that can make you trip. What can I do in the kitchen? Clean up any spills right away. Avoid walking on wet floors. Keep items  that you use a lot in easy-to-reach places. If you need to reach something above you, use a strong step stool that has a grab bar. Keep electrical cords out of the way. Do not use floor polish or wax that makes floors slippery. If you must use wax, use non-skid floor wax. Do not have throw rugs and other things on the floor that can make you trip. What can I do with my stairs? Do not leave any items on the stairs. Make sure that there are handrails on both sides of the stairs and use them. Fix handrails that are broken or loose. Make sure that handrails are as long as the stairways. Check any carpeting to make sure that it is firmly attached to the stairs. Fix any carpet that is loose or worn. Avoid having throw rugs at the top or bottom of the stairs. If you do have throw rugs, attach them to the floor with carpet tape. Make sure that you have a light  switch at the top of the stairs and the bottom of the stairs. If you do not have them, ask someone to add them for you. What else can I do to help prevent falls? Wear shoes that: Do not have high heels. Have rubber bottoms. Are comfortable and fit you well. Are closed at the toe. Do not wear sandals. If you use a stepladder: Make sure that it is fully opened. Do not climb a closed stepladder. Make sure that both sides of the stepladder are locked into place. Ask someone to hold it for you, if possible. Clearly mark and make sure that you can see: Any grab bars or handrails. First and last steps. Where the edge of each step is. Use tools that help you move around (mobility aids) if they are needed. These include: Canes. Walkers. Scooters. Crutches. Turn on the lights when you go into a dark area. Replace any light bulbs as soon as they burn out. Set up your furniture so you have a clear path. Avoid moving your furniture around. If any of your floors are uneven, fix them. If there are any pets around you, be aware of where they are. Review your medicines with your doctor. Some medicines can make you feel dizzy. This can increase your chance of falling. Ask your doctor what other things that you can do to help prevent falls. This information is not intended to replace advice given to you by your health care provider. Make sure you discuss any questions you have with your health care provider. Document Released: 07/18/2009 Document Revised: 02/27/2016 Document Reviewed: 10/26/2014 Elsevier Interactive Patient Education  2017 [place] A mammogram is an X-ray of the breasts. This procedure can screen for and detect any changes that may indicate breast cancer. Mammograms are regularly done beginning at age 71 for women with average risk. A man may have a mammogram if he has a lump or swelling in his breast tissue. A mammogram can also identify other changes and variations in  the breast, such as: Inflammation of the breast tissue (mastitis). An infected area that contains a collection of pus (abscess). A fluid-filled sac (cyst). Tumors that are not cancerous (benign). Fibrocystic changes. This is when breast tissue becomes denser and can make the tissue feel rope-like or uneven under the skin. Women at higher risk for breast cancer need earlier and more comprehensive screening for abnormal changes. Breast tomosynthesis, or three-dimensional (3D) mammography, and digital breast tomosynthesis are advanced forms of imaging  that create 3D pictures of the breasts. Tell a health care provider: About any allergies you have. If you have breast implants. If you have had previous breast disease, biopsy, or surgery. If you have a family history of breast cancer. If you are breastfeeding. Whether you are pregnant or may be pregnant. What are the risks? Generally, this is a safe procedure. However, problems may occur, including: Exposure to radiation. Radiation levels are very low with this test. The need for more tests. The mammogram fails to detect certain cancers or the results are misinterpreted. Difficulty with detecting breast cancer in women with dense breasts. What happens before the procedure? Schedule your test about 1-2 weeks after your menstrual period if you are menstruating. This is usually when your breasts are the least tender. If you have had a mammogram done at a different facility in the past, get the mammogram X-rays or have them sent to your current exam facility. The new and old images will be compared. Wash your breasts and underarms on the day of the test. Do not wear deodorants, perfumes, lotions, or powders anywhere on your body on the day of the test. Remove any jewelry from your neck. Wear clothes that you can change into and out of easily. What happens during the procedure?  You will undress from the waist up and put on a gown that opens in  the front. You will stand in front of the X-ray machine. Each breast will be placed between two plastic or glass plates. The plates will compress your breast for a few seconds. Try to stay as relaxed as possible. This procedure does not cause any harm to your breasts. Any discomfort you feel will be very brief. X-rays will be taken from different angles of each breast. The procedure may vary among health care providers and hospitals. What can I expect after the procedure? The mammogram will be examined by a specialist (radiologist). You may need to repeat certain parts of the test, depending on the quality of the images. This is done if the radiologist needs a better view of the breast tissue. You may resume your normal activities. It is up to you to get the results of your procedure. Ask your health care provider, or the department that is doing the procedure, when your results will be ready. Summary A mammogram is an X-ray of the breasts. This procedure can screen for and detect any changes that may indicate breast cancer. A man may have a mammogram if he has a lump or swelling in his breast tissue. If you have had a mammogram done at a different facility in the past, get the mammogram X-rays or have them sent to your current exam facility in order to compare them. Schedule your test about 1-2 weeks after your menstrual period if you are menstruating. Ask when your test results will be ready. Make sure you get your test results. This information is not intended to replace advice given to you by your health care provider. Make sure you discuss any questions you have with your health care provider. Document Revised: 06/04/2021 Document Reviewed: 07/22/2020 Elsevier Patient Education  [place].

## 2022-07-27 NOTE — Progress Notes (Signed)
Subjective:   Kara Dixon is a 86 y.o. female who presents for Medicare Annual (Subsequent) preventive examination.  Review of Systems    No ROS.  Medicare Wellness Virtual Visit.  Visual/audio telehealth visit, UTA vital signs.   See social history for additional risk factors.   Cardiac Risk Factors include: advanced age (>79mn, >>29women)     Objective:    Today's Vitals   07/27/22 1349  Weight: 110 lb (49.9 kg)  Height: 5' (1.524 m)   Body mass index is 21.48 kg/m.     07/27/2022    2:09 PM 05/12/2021    1:54 PM 02/25/2021    7:40 AM 02/11/2021   10:25 AM 09/23/2018    9:57 AM 09/23/2017    9:11 AM 09/22/2016    8:53 AM  Advanced Directives  Does Patient Have a Medical Advance Directive? No No No No No No No  Would patient like information on creating a medical advance directive? No - Patient declined No - Patient declined No - Patient declined Yes (MAU/Ambulatory/Procedural Areas - Information given) No - Patient declined Yes (MAU/Ambulatory/Procedural Areas - Information given) No - Patient declined    Current Medications (verified) Outpatient Encounter Medications as of 07/27/2022  Medication Sig   aspirin 81 MG tablet Take 81 mg by mouth daily.   atenolol (TENORMIN) 50 MG tablet TAKE 1 TABLET BY MOUTH EVERY DAY   busPIRone (BUSPAR) 5 MG tablet Take 1 tablet (5 mg total) by mouth daily as needed.   fexofenadine (ALLEGRA) 180 MG tablet Take 180 mg by mouth as needed.    lactase (LACTAID) 3000 UNITS tablet Take 1 tablet by mouth as needed.   ondansetron (ZOFRAN-ODT) 4 MG disintegrating tablet Take 1 tablet (4 mg total) by mouth every 8 (eight) hours as needed for nausea or vomiting.   progesterone (PROMETRIUM) 100 MG capsule TAKE 1 CAPSULE BY MOUTH EVERY DAY   rosuvastatin (CRESTOR) 10 MG tablet TAKE 1 TABLET BY MOUTH EVERY DAY   sertraline (ZOLOFT) 50 MG tablet TAKE 1/2 ('25MG'$ ) BY MOUTH ONCE DAILY FOR 10 DAYS THEN INCREASE TO 1 DAILY   No facility-administered  encounter medications on file as of 07/27/2022.    Allergies (verified) Avelox [moxifloxacin hcl in nacl], Mucinex [guaifenesin er], Penicillins, Sulfa antibiotics, and Maxitrol [neomycin-polymyxin-dexameth]   History: Past Medical History:  Diagnosis Date   Cancer ([facility]    skin   Hypercholesterolemia    Hypertension    Nephrolithiasis    Osteopenia    Vitamin D deficiency    Past Surgical History:  Procedure Laterality Date   CATARACT EXTRACTION W/PHACO Right 02/11/2021   Procedure: CATARACT EXTRACTION PHACO AND INTRAOCULAR LENS PLACEMENT [place];  Surgeon: PBirder Robson MD;  [facility]  Service: Ophthalmology;  Laterality: Right;  CDE15.19 01:16.1 minutes   CATARACT EXTRACTION W/PHACO Left 02/25/2021   Procedure: CATARACT EXTRACTION PHACO AND INTRAOCULAR LENS PLACEMENT ([facility]) LEFT;  Surgeon: PBirder Robson MD;  [facility]  Service: Ophthalmology;  Laterality: Left;  18.25 01:23.4   cyst removed under tongue  age 86  EYE SURGERY  02/2017   EYE LID; care everywhere   Family History  Problem Relation Age of Onset   Stroke Mother    Hypertension Mother    Fibromyalgia Sister    Breast cancer Neg Hx    Colon cancer Neg Hx    Social History   Socioeconomic History   Marital status: Married    Spouse name: Not on file  Number of children: 1   Years of education: Not on file   Highest education level: Not on file  Occupational History   Not on file  Tobacco Use   Smoking status: Never   Smokeless tobacco: Never  Vaping Use   Vaping Use: Never used  Substance and Sexual Activity   Alcohol use: No    Alcohol/week: 0.0 standard drinks of alcohol   Drug use: No   Sexual activity: Not Currently  Other Topics Concern   Not on file  Social History Narrative   Not on file   Social Determinants of Health   Financial Resource Strain: Low Risk  (07/27/2022)   Overall Financial Resource Strain (CARDIA)    Difficulty of  Paying Living Expenses: Not hard at all  Food Insecurity: No Food Insecurity (07/27/2022)   Hunger Vital Sign    Worried About Running Out of Food in the Last Year: Never true    Ran Out of Food in the Last Year: Never true  Transportation Needs: No Transportation Needs (07/27/2022)   PRAPARE - Hydrologist (Medical): No    Lack of Transportation (Non-Medical): No  Physical Activity: Unknown (05/12/2021)   Exercise Vital Sign    Days of Exercise per Week: 0 days    Minutes of Exercise per Session: Not on file  Stress: No Stress Concern Present (07/27/2022)   [place]    Feeling of Stress : Only a little  Social Connections: Unknown (07/27/2022)   Social Connection and Isolation Panel [NHANES]    Frequency of Communication with Friends and Family: Not on file    Frequency of Social Gatherings with Friends and Family: Not on file    Attends Religious Services: Not on file    Active Member of Clubs or Organizations: Not on file    Attends Archivist Meetings: Not on file    Marital Status: Married    Tobacco Counseling Counseling given: Not Answered   Clinical Intake:  Pre-visit preparation completed: Yes        Diabetes:  (Followed by PCP) Type 2 diabetes mellitus with hyperglycemia.  Denies any unhealing wounds and any further CCM or nutritional follow up needed at this time.  Foot exam deferred.   How often do you need to have someone help you when you read instructions, pamphlets, or other written materials from your doctor or pharmacy?: 1 - Never    Interpreter Needed?: No      Activities of Daily Living    07/27/2022    1:59 PM  In your present state of health, do you have any difficulty performing the following activities:  Hearing? 0  Vision? 0  Difficulty concentrating or making decisions? 1  Walking or climbing stairs? 1  Comment Paces self with  activity  Dressing or bathing? 1  Doing errands, shopping? 1  Comment Family friend [place] and eating ? N  Using the Toilet? N  In the past six months, have you accidently leaked urine? Y  Comment Plans to start wearing daily depend brief. Declines additional follow up at this time.  Do you have problems with loss of bowel control? N  Managing your Medications? Y  Managing your Finances? N  Comment Husband assist  Housekeeping or managing your Housekeeping? N    Patient Care Team: Einar Pheasant, MD as PCP - General (Internal Medicine)  Indicate any recent Medical Services you may  have received from other than [facility] providers in the past year (date may be approximate).     Assessment:   This is a routine wellness examination for Shamon.  I connected with  [name] on 07/27/22 by a audio enabled telemedicine application and verified that I am speaking with the correct person using two identifiers. Information also received from neighbor, Darlene, HIPAA complaint.   Patient Location: Home  Provider Location: Office/Clinic  I discussed the limitations of evaluation and management by telemedicine. The patient expressed understanding and agreed to proceed.   Hearing/Vision screen Hearing Screening - Comments:: Patient is able to hear conversational tones without difficulty. No issues reported.  Vision Screening - Comments:: No retinopathy reported Cataract extraction, bilateral  They have seen their ophthalmologist in the last 12 months  Dietary issues and exercise activities discussed:   Regular diet Drinking Ensure daily Fair water intake   Goals Addressed               This Visit's Progress     Patient Stated     Increase physical activity (pt-stated)        Stay active and use stretch bands for exercise       Depression Screen    07/27/2022    2:06 PM 03/11/2022   10:04 AM 12/29/2021    7:27 AM 05/12/2021    1:49 PM 04/03/2021   11:25  AM 08/20/2020    2:08 PM 09/23/2018    9:57 AM  PHQ 2/9 Scores  PHQ - 2 Score 0 0 5 0 0 0 0  PHQ- 9 Score   10        Fall Risk    07/27/2022    1:57 PM 03/11/2022   10:03 AM 12/29/2021    7:28 AM 05/12/2021    1:48 PM 04/03/2021   11:24 AM  Fall Risk   Falls in the past year? '1 1 1 '$ 0 0  Number falls in past yr: 0 1 1  0  Injury with Fall? 0 1 0  0  Risk for fall due to :  History of fall(s);Impaired balance/gait;Impaired vision Mental status change;Impaired vision;Impaired balance/gait    Risk for fall due to: Comment Hx of vertigo      Follow up Falls evaluation completed Falls evaluation completed Falls prevention discussed Falls evaluation completed Falls evaluation completed    [place]: Home free of loose throw rugs in walkways, pet beds, electrical cords, etc? Yes  Adequate lighting in your home to reduce risk of falls? Yes   ASSISTIVE DEVICES UTILIZED TO PREVENT FALLS: Life alert? No  Use of a cane, walker or w/c? No  Grab bars in the bathroom? Yes  Shower chair or bench in shower? Yes  Elevated toilet seat or a handicapped toilet? No   TIMED UP AND GO: Was the test performed? No .   Cognitive Function:  Patient is alert and oriented x3.       07/27/2022    2:27 PM 05/12/2021    1:51 PM 09/23/2018   10:00 AM 09/23/2017    9:25 AM 09/22/2016    9:18 AM  6CIT Screen  What Year? 0 points 0 points 0 points 0 points 0 points  What month? 0 points 0 points 0 points 0 points 0 points  What time? 0 points 0 points 0 points 0 points 0 points  Count back from 20 2 points 0 points 0 points 0 points 0 points  Months in reverse 4 points 0 points 0 points 0 points 0 points  Repeat phrase   0 points 0 points   Total Score   0 points 0 points     Immunizations Immunization History  Administered Date(s) Administered   Fluad Quad(high Dose 65+) 09/11/2019, 08/20/2020, 10/23/2021   Influenza Split 07/10/2013   Influenza, High Dose  Seasonal PF 10/30/2016, 09/10/2017, 09/06/2018   Influenza,inj,Quad PF,6+ Mos 09/10/2014, 09/18/2015   Influenza-Unspecified 07/25/2012, 07/11/2013   Moderna Sars-Covid-2 Vaccination 04/25/2020, 05/23/2020, 11/27/2020   Pneumococcal Conjugate-13 03/09/2017   Pneumococcal Polysaccharide-23 05/18/2018   TDAP status: Due, Education has been provided regarding the importance of this vaccine. Advised may receive this vaccine at local pharmacy or Health Dept. Aware to provide a copy of the vaccination record if obtained from local pharmacy or Health Dept. Verbalized acceptance and understanding.  Flu Vaccine status: Due, Education has been provided regarding the importance of this vaccine. Advised may receive this vaccine at local pharmacy or Health Dept. Aware to provide a copy of the vaccination record if obtained from local pharmacy or Health Dept. Verbalized acceptance and understanding.  Covid-19 vaccine status: Completed vaccines x3.   Shingrix Completed?: No.    Education has been provided regarding the importance of this vaccine. Patient has been advised to call insurance company to determine out of pocket expense if they have not yet received this vaccine. Advised may also receive vaccine at local pharmacy or Health Dept. Verbalized acceptance and understanding.  Screening Tests Health Maintenance  Topic Date Due   MAMMOGRAM  12/17/2021   OPHTHALMOLOGY EXAM  02/11/2022   HEMOGLOBIN A1C  07/01/2022   INFLUENZA VACCINE  08/05/2022 (Originally 05/05/2022)   FOOT EXAM  08/05/2022 (Originally 11/21/2021)   COVID-19 Vaccine (4 - Moderna risk series) 08/12/2022 (Originally 01/22/2021)   TETANUS/TDAP  09/04/2022 (Originally 04/30/1955)   Zoster Vaccines- Shingrix (1 of 2) 10/27/2022 (Originally 04/30/1955)   Diabetic kidney evaluation - Urine ACR  12/30/2022   Diabetic kidney evaluation - GFR measurement  02/24/2023   Pneumonia Vaccine 48+ Years old  Completed   DEXA SCAN  Completed   HPV VACCINES   Aged Out    Health Maintenance Health Maintenance Due  Topic Date Due   MAMMOGRAM  12/17/2021   OPHTHALMOLOGY EXAM  02/11/2022   HEMOGLOBIN A1C  07/01/2022   Mammogram- ordered per consent.   DG Chest 2 View: completed 12/29/21.  Hepatitis C Screening: does not qualify.  Vision Screening: Recommended annual ophthalmology exams for early detection of glaucoma and other disorders of the eye.  Dental Screening: Recommended annual dental exams for proper oral hygiene  Community Resource Referral / Chronic Care Management: CRR required this visit?  No   CCM required this visit?  No      Plan:     I have personally reviewed and noted the following in the patient's chart:   Medical and social history Use of alcohol, tobacco or illicit drugs  Current medications and supplements including opioid prescriptions. Patient is not currently taking opioid prescriptions. Functional ability and status Nutritional status Physical activity Advanced directives List of other physicians Hospitalizations, surgeries, and ER visits in previous 12 months Vitals Screenings to include cognitive, depression, and falls Referrals and appointments  In addition, I have reviewed and discussed with patient certain preventive protocols, quality metrics, and best practice recommendations. A written personalized care plan for preventive services as well as general preventive health recommendations were provided to patient.     Leta Jungling, LPN  07/27/2022         

## 2022-08-18 DIAGNOSIS — H8111 Benign paroxysmal vertigo, right ear: Secondary | ICD-10-CM | POA: Diagnosis not present

## 2022-08-18 DIAGNOSIS — H6983 Other specified disorders of Eustachian tube, bilateral: Secondary | ICD-10-CM | POA: Diagnosis not present

## 2022-08-18 DIAGNOSIS — H903 Sensorineural hearing loss, bilateral: Secondary | ICD-10-CM | POA: Diagnosis not present

## 2022-08-21 ENCOUNTER — Ambulatory Visit (INDEPENDENT_AMBULATORY_CARE_PROVIDER_SITE_OTHER): Payer: Medicare Other | Admitting: Internal Medicine

## 2022-08-21 ENCOUNTER — Encounter: Payer: Self-pay | Admitting: Internal Medicine

## 2022-08-21 VITALS — BP 126/68 | HR 72 | Temp 98.1°F | Resp 18 | Ht 60.0 in | Wt 114.6 lb

## 2022-08-21 DIAGNOSIS — E1165 Type 2 diabetes mellitus with hyperglycemia: Secondary | ICD-10-CM | POA: Diagnosis not present

## 2022-08-21 DIAGNOSIS — I10 Essential (primary) hypertension: Secondary | ICD-10-CM | POA: Diagnosis not present

## 2022-08-21 DIAGNOSIS — R42 Dizziness and giddiness: Secondary | ICD-10-CM | POA: Diagnosis not present

## 2022-08-21 DIAGNOSIS — E7800 Pure hypercholesterolemia, unspecified: Secondary | ICD-10-CM

## 2022-08-21 DIAGNOSIS — D72829 Elevated white blood cell count, unspecified: Secondary | ICD-10-CM | POA: Diagnosis not present

## 2022-08-21 DIAGNOSIS — E559 Vitamin D deficiency, unspecified: Secondary | ICD-10-CM

## 2022-08-21 DIAGNOSIS — N1831 Chronic kidney disease, stage 3a: Secondary | ICD-10-CM | POA: Diagnosis not present

## 2022-08-21 DIAGNOSIS — R739 Hyperglycemia, unspecified: Secondary | ICD-10-CM

## 2022-08-21 DIAGNOSIS — R2681 Unsteadiness on feet: Secondary | ICD-10-CM

## 2022-08-21 LAB — BASIC METABOLIC PANEL
BUN: 25 mg/dL — ABNORMAL HIGH (ref 6–23)
CO2: 27 mEq/L (ref 19–32)
Calcium: 9.1 mg/dL (ref 8.4–10.5)
Chloride: 106 mEq/L (ref 96–112)
Creatinine, Ser: 1.16 mg/dL (ref 0.40–1.20)
GFR: 43.35 mL/min — ABNORMAL LOW (ref >60.00)
Glucose, Bld: 87 mg/dL (ref 70–99)
Potassium: 4.6 mEq/L (ref 3.5–5.1)
Sodium: 139 mEq/L (ref 135–145)

## 2022-08-21 LAB — HEPATIC FUNCTION PANEL
ALT: 2 U/L (ref 0–35)
AST: 12 U/L (ref 0–37)
Albumin: 3.8 g/dL (ref 3.5–5.2)
Alkaline Phosphatase: 63 U/L (ref 39–117)
Bilirubin, Direct: 0.1 mg/dL (ref 0.0–0.3)
Total Bilirubin: 0.4 mg/dL (ref 0.2–1.2)
Total Protein: 7.2 g/dL (ref 6.0–8.3)

## 2022-08-21 LAB — LIPID PANEL
Cholesterol: 111 mg/dL (ref 0–200)
HDL: 47.2 mg/dL (ref >39.00)
LDL Cholesterol: 45 mg/dL (ref 0–99)
NonHDL: 63.35
Total CHOL/HDL Ratio: 2
Triglycerides: 90 mg/dL (ref 0.0–149.0)
VLDL: 18 mg/dL (ref 0.0–40.0)

## 2022-08-21 LAB — CBC WITH DIFFERENTIAL/PLATELET
Basophils Absolute: 0.1 10*3/uL (ref 0.0–0.1)
Basophils Relative: 0.7 % (ref 0.0–3.0)
Eosinophils Absolute: 0.2 10*3/uL (ref 0.0–0.7)
Eosinophils Relative: 2.4 % (ref 0.0–5.0)
HCT: 34.8 % — ABNORMAL LOW (ref 36.0–46.0)
Hemoglobin: 11.8 g/dL — ABNORMAL LOW (ref 12.0–15.0)
Lymphocytes Relative: 29.4 % (ref 12.0–46.0)
Lymphs Abs: 2.9 10*3/uL (ref 0.7–4.0)
MCHC: 33.9 g/dL (ref 30.0–36.0)
MCV: 93.1 fl (ref 78.0–100.0)
Monocytes Absolute: 0.8 10*3/uL (ref 0.1–1.0)
Monocytes Relative: 7.8 % (ref 3.0–12.0)
Neutro Abs: 6 10*3/uL (ref 1.4–7.7)
Neutrophils Relative %: 59.7 % (ref 43.0–77.0)
Platelets: 248 10*3/uL (ref 150.0–400.0)
RBC: 3.74 Mil/uL — ABNORMAL LOW (ref 3.87–5.11)
RDW: 13.5 % (ref 11.5–15.5)
WBC: 10 10*3/uL (ref 4.0–10.5)

## 2022-08-21 LAB — VITAMIN D 25 HYDROXY (VIT D DEFICIENCY, FRACTURES): VITD: 47.42 ng/mL (ref 30.00–100.00)

## 2022-08-21 LAB — HEMOGLOBIN A1C: Hgb A1c MFr Bld: 6.4 % (ref 4.6–6.5)

## 2022-08-21 NOTE — Progress Notes (Unsigned)
Patient ID: Kara Dixon, female   DOB: Mar 01, 1936, 86 y.o.   MRN: [id number]   Subjective:    Patient ID: Kara Dixon, female    DOB: 06-Nov-1935, 86 y.o.   MRN: [id number]   Patient here for  Chief Complaint  Patient presents with   Follow-up   Hypertension   .   HPI Here to follow up regarding her blood pressure and cholesterol.  She is accompanied by her neighbor Genia Plants [phone]).  History obtained from both of them.  Seeing neurology for parkinsons.  Started sinemet.  Recommended continuing aspirin and crestor.  States had head congestion last week.  Feeling better now. Feels back to her normal.  Saw ENT - states crystals off.  S/p Epley maneuvers.  Helped.  Planning to f/u 08/25/22.  Fell 3-4 weeks ago  reports no residual problems from her fall.  Some trouble with walking, balance and unsteady gait.  Discussed home health PT.  Request aid at home as well.  Recurrent pain - post shingles - lyrica.  Helping.  No chest pain.  Breathing stable.  Eating.  No vomiting or diarrhea reported.  No abdominal pain.     Past Medical History:  Diagnosis Date   Cancer ([place])    skin   Hypercholesterolemia    Hypertension    Nephrolithiasis    Osteopenia    Vitamin D deficiency    Past Surgical History:  Procedure Laterality Date   CATARACT EXTRACTION W/PHACO Right 02/11/2021   Procedure: CATARACT EXTRACTION PHACO AND INTRAOCULAR LENS PLACEMENT ([place]) RIGHT;  Surgeon: Birder Robson, MD;  Location: [place];  Service: Ophthalmology;  Laterality: Right;  CDE15.19 01:16.1 minutes   CATARACT EXTRACTION W/PHACO Left 02/25/2021   Procedure: CATARACT EXTRACTION PHACO AND INTRAOCULAR LENS PLACEMENT ([facility]) LEFT;  Surgeon: Birder Robson, MD;  Location: [place];  Service: Ophthalmology;  Laterality: Left;  18.25 01:23.4   cyst removed under tongue  age 55   EYE SURGERY  02/2017   EYE LID; care everywhere   Family History  Problem Relation Age of Onset    Stroke Mother    Hypertension Mother    Fibromyalgia Sister    Breast cancer Neg Hx    Colon cancer Neg Hx    Social History   Socioeconomic History   Marital status: Married    Spouse name: Not on file   Number of children: 1   Years of education: Not on file   Highest education level: Not on file  Occupational History   Not on file  Tobacco Use   Smoking status: Never   Smokeless tobacco: Never  Vaping Use   Vaping Use: Never used  Substance and Sexual Activity   Alcohol use: No    Alcohol/week: 0.0 standard drinks of alcohol   Drug use: No   Sexual activity: Not Currently  Other Topics Concern   Not on file  Social History Narrative   Not on file   Social Determinants of Health   Financial Resource Strain: Low Risk  (07/27/2022)   Overall Financial Resource Strain (CARDIA)    Difficulty of Paying Living Expenses: Not hard at all  Food Insecurity: No Food Insecurity (07/27/2022)   Hunger Vital Sign    Worried About Running Out of Food in the Last Year: Never true    Ran Out of Food in the Last Year: Never true  Transportation Needs: No Transportation Needs (07/27/2022)   PRAPARE - Transportation    Lack of  Transportation (Medical): No    Lack of Transportation (Non-Medical): No  Physical Activity: Unknown (05/12/2021)   Exercise Vital Sign    Days of Exercise per Week: 0 days    Minutes of Exercise per Session: Not on file  Stress: No Stress Concern Present (07/27/2022)   [place]    Feeling of Stress : Only a little  Social Connections: Unknown (07/27/2022)   Social Connection and Isolation Panel [NHANES]    Frequency of Communication with Friends and Family: Not on file    Frequency of Social Gatherings with Friends and Family: Not on file    Attends Religious Services: Not on file    Active Member of Clubs or Organizations: Not on file    Attends Archivist Meetings: Not on file     Marital Status: Married     Review of Systems  Constitutional:  Negative for appetite change and unexpected weight change.  HENT:  Negative for congestion and sinus pressure.   Respiratory:  Negative for cough, chest tightness and shortness of breath.   Cardiovascular:  Negative for chest pain, palpitations and leg swelling.  Gastrointestinal:  Negative for abdominal pain, diarrhea, nausea and vomiting.  Genitourinary:  Negative for difficulty urinating and dysuria.  Musculoskeletal:  Negative for joint swelling and myalgias.  Skin:  Negative for color change and rash.  Neurological:  Positive for dizziness. Negative for headaches.       Some weakness, unsteady gait.    Psychiatric/Behavioral:  Negative for agitation and dysphoric mood.        Objective:     BP 126/68   Pulse 72   Temp 98.1 F (36.7 C) (Temporal)   Resp 18   Ht 5' (1.524 m)   Wt 114 lb 9.6 oz (52 kg)   SpO2 96%   BMI 22.38 kg/m  Wt Readings from Last 3 Encounters:  08/21/22 114 lb 9.6 oz (52 kg)  07/27/22 110 lb (49.9 kg)  03/27/22 110 lb (49.9 kg)    Physical Exam Vitals reviewed.  Constitutional:      General: She is not in acute distress.    Appearance: Normal appearance.  HENT:     Head: Normocephalic and atraumatic.     Right Ear: External ear normal.     Left Ear: External ear normal.  Eyes:     General: No scleral icterus.       Right eye: No discharge.        Left eye: No discharge.     Conjunctiva/sclera: Conjunctivae normal.  Neck:     Thyroid: No thyromegaly.  Cardiovascular:     Rate and Rhythm: Normal rate and regular rhythm.  Pulmonary:     Effort: No respiratory distress.     Breath sounds: Normal breath sounds. No wheezing.  Abdominal:     General: Bowel sounds are normal.     Palpations: Abdomen is soft.     Tenderness: There is no abdominal tenderness.  Musculoskeletal:        General: No swelling or tenderness.     Cervical back: Neck supple. No tenderness.   Lymphadenopathy:     Cervical: No cervical adenopathy.  Skin:    Findings: No erythema or rash.  Neurological:     Mental Status: She is alert.     Comments: Unsteady gait.  Leg / core weakness.   Psychiatric:        Mood and Affect: Mood normal.  Behavior: Behavior normal.      Outpatient Encounter Medications as of 08/21/2022  Medication Sig   aspirin 81 MG tablet Take 81 mg by mouth daily.   atenolol (TENORMIN) 50 MG tablet TAKE 1 TABLET BY MOUTH EVERY DAY   busPIRone (BUSPAR) 5 MG tablet Take 1 tablet (5 mg total) by mouth daily as needed.   Cyanocobalamin (B-12 COMPLIANCE INJECTION) 1000 MCG/ML KIT Inject as directed.   fexofenadine (ALLEGRA) 180 MG tablet Take 180 mg by mouth as needed.    lactase (LACTAID) 3000 UNITS tablet Take 1 tablet by mouth as needed.   ondansetron (ZOFRAN-ODT) 4 MG disintegrating tablet Take 1 tablet (4 mg total) by mouth every 8 (eight) hours as needed for nausea or vomiting.   PREGABALIN PO Take by mouth.   progesterone (PROMETRIUM) 100 MG capsule TAKE 1 CAPSULE BY MOUTH EVERY DAY   rosuvastatin (CRESTOR) 10 MG tablet TAKE 1 TABLET BY MOUTH EVERY DAY   sertraline (ZOLOFT) 50 MG tablet TAKE 1/2 (25MG) BY MOUTH ONCE DAILY FOR 10 DAYS THEN INCREASE TO 1 DAILY   No facility-administered encounter medications on file as of 08/21/2022.     Lab Results  Component Value Date   WBC 10.0 08/21/2022   HGB 11.8 (L) 08/21/2022   HCT 34.8 (L) 08/21/2022   PLT 248.0 08/21/2022   GLUCOSE 87 08/21/2022   CHOL 111 08/21/2022   TRIG 90.0 08/21/2022   HDL 47.20 08/21/2022   LDLDIRECT 139.4 05/12/2013   LDLCALC 45 08/21/2022   ALT 2 08/21/2022   AST 12 08/21/2022   NA 139 08/21/2022   K 4.6 08/21/2022   CL 106 08/21/2022   CREATININE 1.16 08/21/2022   BUN 25 (H) 08/21/2022   CO2 27 08/21/2022   TSH 3.52 12/29/2021   HGBA1C 6.4 08/21/2022   MICROALBUR 3.5 (H) 12/29/2021    MR Brain W Wo Contrast  Result Date: 03/04/2022 CLINICAL DATA:   Right history: Tremor. Dizziness. Dizziness, nonspecific. Additional history provided by scanning technologist: Vertigo and confusion for 3 months. EXAM: MRI HEAD WITHOUT AND WITH CONTRAST TECHNIQUE: Multiplanar, multiecho pulse sequences of the brain and surrounding structures were obtained without and with intravenous contrast. CONTRAST:  3m GADAVIST GADOBUTROL 1 MMOL/ML IV SOLN COMPARISON:  No pertinent prior exams available for comparison. FINDINGS: Brain: No age advanced or lobar predominant parenchymal atrophy. Multifocal T2 FLAIR hyperintense signal abnormality within the cerebral white matter and pons, nonspecific but compatible with mild chronic small vessel ischemic disease. Prominent perivascular space within the inferior right basal ganglia. 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 There is no acute infarct. No evidence of an intracranial mass. No chronic intracranial blood products. No extra-axial fluid collection. No midline shift. No pathologic intracranial enhancement identified. Vascular: Maintained flow voids within the proximal large arterial vessels. Skull and upper cervical spine: No focal suspicious marrow lesion. Sinuses/Orbits: No mass or acute finding within the imaged orbits. Prior bilateral ocular lens replacement. Mild mucosal thickening within the bilateral ethmoid, right sphenoid and bilateral maxillary sinuses. IMPRESSION: No evidence of acute intracranial abnormality. Mild chronic small vessel ischemic changes within the cerebral white matter and pons. 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 Mild paranasal sinus mucosal thickening. Electronically Signed   By: KKellie SimmeringD.O.   On: 03/04/2022 14:43       Assessment & Plan:   Problem List Items Addressed This Visit     CKD (chronic kidney disease) stage 3, GFR 30-59 ml/min (HCC) -  Primary    Avoid antiinflammatories.  Stay hydrated.  Follow  metabolic panel.       Relevant Orders   Ambulatory referral to [facility]   Dizziness    ENT - 04/2022 - dx with BPPV.  S/p epley maneuvers.  Some better.  Planning for f/u 08/25/22.        Relevant Orders   Ambulatory referral to Home Health   Hypercholesterolemia    On crestor. Follow lipid panel and liver function tests.   Lab Results  Component Value Date   CHOL 111 08/21/2022   HDL 47.20 08/21/2022   LDLCALC 45 08/21/2022   LDLDIRECT 139.4 05/12/2013   TRIG 90.0 08/21/2022   CHOLHDL 2 08/21/2022       Relevant Orders   CBC with Differential/Platelet (Completed)   Lipid panel (Completed)   Hepatic function panel (Completed)   Hyperglycemia    Low carb diet and exercise.  Follow met b and a1c.       Hypertension    Blood pressure doing well on atenolol.  Remain off amlodipine.  Follow pressures.  Follow metabolic panel.       Relevant Orders   Basic metabolic panel (Completed)   Leukocytosis    Follow cbc.       Type 2 diabetes mellitus with hyperglycemia (HCC)    Appetite is better.  Follow met b and a1c.       Relevant Orders   Hemoglobin A1c (Completed)   Ambulatory referral to Home Health   Unsteady gait    Unsteady gait and weakness.  Recent fall.  Discussed home health for PT and needs aid to help with ADLs.        Relevant Orders   Ambulatory referral to Home Health   Vitamin D deficiency   Relevant Orders   VITAMIN D 25 Hydroxy (Vit-D Deficiency, Fractures) (Completed)     Einar Pheasant, MD

## 2022-08-22 ENCOUNTER — Other Ambulatory Visit: Payer: Self-pay | Admitting: Internal Medicine

## 2022-08-22 ENCOUNTER — Encounter: Payer: Self-pay | Admitting: Internal Medicine

## 2022-08-22 DIAGNOSIS — D649 Anemia, unspecified: Secondary | ICD-10-CM

## 2022-08-22 DIAGNOSIS — R944 Abnormal results of kidney function studies: Secondary | ICD-10-CM

## 2022-08-22 DIAGNOSIS — R2681 Unsteadiness on feet: Secondary | ICD-10-CM | POA: Insufficient documentation

## 2022-08-22 NOTE — Assessment & Plan Note (Signed)
ENT - 04/2022 - dx with BPPV.  S/p epley maneuvers.  Some better.  Planning for f/u 08/25/22.

## 2022-08-22 NOTE — Assessment & Plan Note (Signed)
Low carb diet and exercise.  Follow met b and a1c.  

## 2022-08-22 NOTE — Assessment & Plan Note (Signed)
Avoid antiinflammatories.  Stay hydrated.  Follow metabolic panel.   

## 2022-08-22 NOTE — Progress Notes (Signed)
Order placed for f/u labs.  

## 2022-08-22 NOTE — Assessment & Plan Note (Signed)
Unsteady gait and weakness.  Recent fall.  Discussed home health for PT and needs aid to help with ADLs.

## 2022-08-22 NOTE — Assessment & Plan Note (Signed)
Blood pressure doing well on atenolol.  Remain off amlodipine.  Follow pressures.  Follow metabolic panel.

## 2022-08-22 NOTE — Assessment & Plan Note (Signed)
On crestor. Follow lipid panel and liver function tests.   Lab Results  Component Value Date   CHOL 111 08/21/2022   HDL 47.20 08/21/2022   LDLCALC 45 08/21/2022   LDLDIRECT 139.4 05/12/2013   TRIG 90.0 08/21/2022   CHOLHDL 2 08/21/2022

## 2022-08-22 NOTE — Assessment & Plan Note (Signed)
Follow cbc.  

## 2022-08-22 NOTE — Assessment & Plan Note (Signed)
Appetite is better.  Follow met b and a1c.

## 2022-08-24 ENCOUNTER — Other Ambulatory Visit: Payer: Self-pay

## 2022-08-24 DIAGNOSIS — R7989 Other specified abnormal findings of blood chemistry: Secondary | ICD-10-CM

## 2022-08-25 DIAGNOSIS — H8111 Benign paroxysmal vertigo, right ear: Secondary | ICD-10-CM | POA: Diagnosis not present

## 2022-08-26 DIAGNOSIS — I129 Hypertensive chronic kidney disease with stage 1 through stage 4 chronic kidney disease, or unspecified chronic kidney disease: Secondary | ICD-10-CM | POA: Diagnosis not present

## 2022-08-26 DIAGNOSIS — E1122 Type 2 diabetes mellitus with diabetic chronic kidney disease: Secondary | ICD-10-CM | POA: Diagnosis not present

## 2022-08-26 DIAGNOSIS — Z7902 Long term (current) use of antithrombotics/antiplatelets: Secondary | ICD-10-CM | POA: Diagnosis not present

## 2022-08-26 DIAGNOSIS — N1831 Chronic kidney disease, stage 3a: Secondary | ICD-10-CM | POA: Diagnosis not present

## 2022-08-26 DIAGNOSIS — M6281 Muscle weakness (generalized): Secondary | ICD-10-CM | POA: Diagnosis not present

## 2022-08-26 DIAGNOSIS — E1165 Type 2 diabetes mellitus with hyperglycemia: Secondary | ICD-10-CM | POA: Diagnosis not present

## 2022-08-26 DIAGNOSIS — G20A1 Parkinson's disease without dyskinesia, without mention of fluctuations: Secondary | ICD-10-CM | POA: Diagnosis not present

## 2022-08-26 DIAGNOSIS — H8110 Benign paroxysmal vertigo, unspecified ear: Secondary | ICD-10-CM | POA: Diagnosis not present

## 2022-08-26 DIAGNOSIS — M8580 Other specified disorders of bone density and structure, unspecified site: Secondary | ICD-10-CM | POA: Diagnosis not present

## 2022-08-26 DIAGNOSIS — Z7982 Long term (current) use of aspirin: Secondary | ICD-10-CM | POA: Diagnosis not present

## 2022-08-26 DIAGNOSIS — I10 Essential (primary) hypertension: Secondary | ICD-10-CM | POA: Diagnosis not present

## 2022-08-26 DIAGNOSIS — Z9181 History of falling: Secondary | ICD-10-CM | POA: Diagnosis not present

## 2022-08-26 DIAGNOSIS — E559 Vitamin D deficiency, unspecified: Secondary | ICD-10-CM | POA: Diagnosis not present

## 2022-08-26 DIAGNOSIS — Z85828 Personal history of other malignant neoplasm of skin: Secondary | ICD-10-CM | POA: Diagnosis not present

## 2022-08-26 DIAGNOSIS — E7800 Pure hypercholesterolemia, unspecified: Secondary | ICD-10-CM | POA: Diagnosis not present

## 2022-08-28 DIAGNOSIS — N1831 Chronic kidney disease, stage 3a: Secondary | ICD-10-CM | POA: Diagnosis not present

## 2022-08-28 DIAGNOSIS — M8580 Other specified disorders of bone density and structure, unspecified site: Secondary | ICD-10-CM | POA: Diagnosis not present

## 2022-08-28 DIAGNOSIS — Z7982 Long term (current) use of aspirin: Secondary | ICD-10-CM | POA: Diagnosis not present

## 2022-08-28 DIAGNOSIS — E7800 Pure hypercholesterolemia, unspecified: Secondary | ICD-10-CM | POA: Diagnosis not present

## 2022-08-28 DIAGNOSIS — I129 Hypertensive chronic kidney disease with stage 1 through stage 4 chronic kidney disease, or unspecified chronic kidney disease: Secondary | ICD-10-CM | POA: Diagnosis not present

## 2022-08-28 DIAGNOSIS — G20A1 Parkinson's disease without dyskinesia, without mention of fluctuations: Secondary | ICD-10-CM | POA: Diagnosis not present

## 2022-08-28 DIAGNOSIS — Z7902 Long term (current) use of antithrombotics/antiplatelets: Secondary | ICD-10-CM | POA: Diagnosis not present

## 2022-08-28 DIAGNOSIS — H8110 Benign paroxysmal vertigo, unspecified ear: Secondary | ICD-10-CM | POA: Diagnosis not present

## 2022-08-28 DIAGNOSIS — E1122 Type 2 diabetes mellitus with diabetic chronic kidney disease: Secondary | ICD-10-CM | POA: Diagnosis not present

## 2022-08-28 DIAGNOSIS — E1165 Type 2 diabetes mellitus with hyperglycemia: Secondary | ICD-10-CM | POA: Diagnosis not present

## 2022-08-28 DIAGNOSIS — M6281 Muscle weakness (generalized): Secondary | ICD-10-CM | POA: Diagnosis not present

## 2022-08-28 DIAGNOSIS — Z85828 Personal history of other malignant neoplasm of skin: Secondary | ICD-10-CM | POA: Diagnosis not present

## 2022-08-28 DIAGNOSIS — Z9181 History of falling: Secondary | ICD-10-CM | POA: Diagnosis not present

## 2022-08-28 DIAGNOSIS — E559 Vitamin D deficiency, unspecified: Secondary | ICD-10-CM | POA: Diagnosis not present

## 2022-08-31 DIAGNOSIS — Z9181 History of falling: Secondary | ICD-10-CM | POA: Diagnosis not present

## 2022-08-31 DIAGNOSIS — N1831 Chronic kidney disease, stage 3a: Secondary | ICD-10-CM | POA: Diagnosis not present

## 2022-08-31 DIAGNOSIS — Z7902 Long term (current) use of antithrombotics/antiplatelets: Secondary | ICD-10-CM | POA: Diagnosis not present

## 2022-08-31 DIAGNOSIS — E1165 Type 2 diabetes mellitus with hyperglycemia: Secondary | ICD-10-CM | POA: Diagnosis not present

## 2022-08-31 DIAGNOSIS — M6281 Muscle weakness (generalized): Secondary | ICD-10-CM | POA: Diagnosis not present

## 2022-08-31 DIAGNOSIS — E1122 Type 2 diabetes mellitus with diabetic chronic kidney disease: Secondary | ICD-10-CM | POA: Diagnosis not present

## 2022-08-31 DIAGNOSIS — Z7982 Long term (current) use of aspirin: Secondary | ICD-10-CM | POA: Diagnosis not present

## 2022-08-31 DIAGNOSIS — H8110 Benign paroxysmal vertigo, unspecified ear: Secondary | ICD-10-CM | POA: Diagnosis not present

## 2022-08-31 DIAGNOSIS — E7800 Pure hypercholesterolemia, unspecified: Secondary | ICD-10-CM | POA: Diagnosis not present

## 2022-08-31 DIAGNOSIS — Z85828 Personal history of other malignant neoplasm of skin: Secondary | ICD-10-CM | POA: Diagnosis not present

## 2022-08-31 DIAGNOSIS — G20A1 Parkinson's disease without dyskinesia, without mention of fluctuations: Secondary | ICD-10-CM | POA: Diagnosis not present

## 2022-08-31 DIAGNOSIS — E559 Vitamin D deficiency, unspecified: Secondary | ICD-10-CM | POA: Diagnosis not present

## 2022-08-31 DIAGNOSIS — I129 Hypertensive chronic kidney disease with stage 1 through stage 4 chronic kidney disease, or unspecified chronic kidney disease: Secondary | ICD-10-CM | POA: Diagnosis not present

## 2022-08-31 DIAGNOSIS — M8580 Other specified disorders of bone density and structure, unspecified site: Secondary | ICD-10-CM | POA: Diagnosis not present

## 2022-09-02 DIAGNOSIS — Z7982 Long term (current) use of aspirin: Secondary | ICD-10-CM | POA: Diagnosis not present

## 2022-09-02 DIAGNOSIS — G20A1 Parkinson's disease without dyskinesia, without mention of fluctuations: Secondary | ICD-10-CM | POA: Diagnosis not present

## 2022-09-02 DIAGNOSIS — M8580 Other specified disorders of bone density and structure, unspecified site: Secondary | ICD-10-CM | POA: Diagnosis not present

## 2022-09-02 DIAGNOSIS — I129 Hypertensive chronic kidney disease with stage 1 through stage 4 chronic kidney disease, or unspecified chronic kidney disease: Secondary | ICD-10-CM | POA: Diagnosis not present

## 2022-09-02 DIAGNOSIS — E1165 Type 2 diabetes mellitus with hyperglycemia: Secondary | ICD-10-CM | POA: Diagnosis not present

## 2022-09-02 DIAGNOSIS — Z7902 Long term (current) use of antithrombotics/antiplatelets: Secondary | ICD-10-CM | POA: Diagnosis not present

## 2022-09-02 DIAGNOSIS — E1122 Type 2 diabetes mellitus with diabetic chronic kidney disease: Secondary | ICD-10-CM | POA: Diagnosis not present

## 2022-09-02 DIAGNOSIS — Z85828 Personal history of other malignant neoplasm of skin: Secondary | ICD-10-CM | POA: Diagnosis not present

## 2022-09-02 DIAGNOSIS — N1831 Chronic kidney disease, stage 3a: Secondary | ICD-10-CM | POA: Diagnosis not present

## 2022-09-02 DIAGNOSIS — E559 Vitamin D deficiency, unspecified: Secondary | ICD-10-CM | POA: Diagnosis not present

## 2022-09-02 DIAGNOSIS — M6281 Muscle weakness (generalized): Secondary | ICD-10-CM | POA: Diagnosis not present

## 2022-09-02 DIAGNOSIS — E7800 Pure hypercholesterolemia, unspecified: Secondary | ICD-10-CM | POA: Diagnosis not present

## 2022-09-02 DIAGNOSIS — Z9181 History of falling: Secondary | ICD-10-CM | POA: Diagnosis not present

## 2022-09-02 DIAGNOSIS — H8110 Benign paroxysmal vertigo, unspecified ear: Secondary | ICD-10-CM | POA: Diagnosis not present

## 2022-09-03 DIAGNOSIS — E7800 Pure hypercholesterolemia, unspecified: Secondary | ICD-10-CM | POA: Diagnosis not present

## 2022-09-03 DIAGNOSIS — G20A1 Parkinson's disease without dyskinesia, without mention of fluctuations: Secondary | ICD-10-CM | POA: Diagnosis not present

## 2022-09-03 DIAGNOSIS — Z9181 History of falling: Secondary | ICD-10-CM | POA: Diagnosis not present

## 2022-09-03 DIAGNOSIS — Z7902 Long term (current) use of antithrombotics/antiplatelets: Secondary | ICD-10-CM | POA: Diagnosis not present

## 2022-09-03 DIAGNOSIS — E1165 Type 2 diabetes mellitus with hyperglycemia: Secondary | ICD-10-CM | POA: Diagnosis not present

## 2022-09-03 DIAGNOSIS — I129 Hypertensive chronic kidney disease with stage 1 through stage 4 chronic kidney disease, or unspecified chronic kidney disease: Secondary | ICD-10-CM | POA: Diagnosis not present

## 2022-09-03 DIAGNOSIS — Z85828 Personal history of other malignant neoplasm of skin: Secondary | ICD-10-CM | POA: Diagnosis not present

## 2022-09-03 DIAGNOSIS — M8580 Other specified disorders of bone density and structure, unspecified site: Secondary | ICD-10-CM | POA: Diagnosis not present

## 2022-09-03 DIAGNOSIS — Z7982 Long term (current) use of aspirin: Secondary | ICD-10-CM | POA: Diagnosis not present

## 2022-09-03 DIAGNOSIS — H8110 Benign paroxysmal vertigo, unspecified ear: Secondary | ICD-10-CM | POA: Diagnosis not present

## 2022-09-03 DIAGNOSIS — N1831 Chronic kidney disease, stage 3a: Secondary | ICD-10-CM | POA: Diagnosis not present

## 2022-09-03 DIAGNOSIS — E1122 Type 2 diabetes mellitus with diabetic chronic kidney disease: Secondary | ICD-10-CM | POA: Diagnosis not present

## 2022-09-03 DIAGNOSIS — E559 Vitamin D deficiency, unspecified: Secondary | ICD-10-CM | POA: Diagnosis not present

## 2022-09-03 DIAGNOSIS — M6281 Muscle weakness (generalized): Secondary | ICD-10-CM | POA: Diagnosis not present

## 2022-09-04 DIAGNOSIS — H8110 Benign paroxysmal vertigo, unspecified ear: Secondary | ICD-10-CM | POA: Diagnosis not present

## 2022-09-04 DIAGNOSIS — G20A1 Parkinson's disease without dyskinesia, without mention of fluctuations: Secondary | ICD-10-CM | POA: Diagnosis not present

## 2022-09-04 DIAGNOSIS — E1165 Type 2 diabetes mellitus with hyperglycemia: Secondary | ICD-10-CM | POA: Diagnosis not present

## 2022-09-04 DIAGNOSIS — E559 Vitamin D deficiency, unspecified: Secondary | ICD-10-CM | POA: Diagnosis not present

## 2022-09-04 DIAGNOSIS — N1831 Chronic kidney disease, stage 3a: Secondary | ICD-10-CM | POA: Diagnosis not present

## 2022-09-04 DIAGNOSIS — I129 Hypertensive chronic kidney disease with stage 1 through stage 4 chronic kidney disease, or unspecified chronic kidney disease: Secondary | ICD-10-CM | POA: Diagnosis not present

## 2022-09-04 DIAGNOSIS — Z9181 History of falling: Secondary | ICD-10-CM | POA: Diagnosis not present

## 2022-09-04 DIAGNOSIS — E7800 Pure hypercholesterolemia, unspecified: Secondary | ICD-10-CM | POA: Diagnosis not present

## 2022-09-04 DIAGNOSIS — Z85828 Personal history of other malignant neoplasm of skin: Secondary | ICD-10-CM | POA: Diagnosis not present

## 2022-09-04 DIAGNOSIS — E1122 Type 2 diabetes mellitus with diabetic chronic kidney disease: Secondary | ICD-10-CM | POA: Diagnosis not present

## 2022-09-04 DIAGNOSIS — Z7982 Long term (current) use of aspirin: Secondary | ICD-10-CM | POA: Diagnosis not present

## 2022-09-04 DIAGNOSIS — Z7902 Long term (current) use of antithrombotics/antiplatelets: Secondary | ICD-10-CM | POA: Diagnosis not present

## 2022-09-04 DIAGNOSIS — M8580 Other specified disorders of bone density and structure, unspecified site: Secondary | ICD-10-CM | POA: Diagnosis not present

## 2022-09-04 DIAGNOSIS — M6281 Muscle weakness (generalized): Secondary | ICD-10-CM | POA: Diagnosis not present

## 2022-09-07 ENCOUNTER — Other Ambulatory Visit: Payer: Self-pay | Admitting: Family

## 2022-09-07 ENCOUNTER — Other Ambulatory Visit: Payer: Medicare Other

## 2022-09-08 DIAGNOSIS — Z7902 Long term (current) use of antithrombotics/antiplatelets: Secondary | ICD-10-CM | POA: Diagnosis not present

## 2022-09-08 DIAGNOSIS — M8580 Other specified disorders of bone density and structure, unspecified site: Secondary | ICD-10-CM | POA: Diagnosis not present

## 2022-09-08 DIAGNOSIS — E7800 Pure hypercholesterolemia, unspecified: Secondary | ICD-10-CM | POA: Diagnosis not present

## 2022-09-08 DIAGNOSIS — E1122 Type 2 diabetes mellitus with diabetic chronic kidney disease: Secondary | ICD-10-CM | POA: Diagnosis not present

## 2022-09-08 DIAGNOSIS — I129 Hypertensive chronic kidney disease with stage 1 through stage 4 chronic kidney disease, or unspecified chronic kidney disease: Secondary | ICD-10-CM | POA: Diagnosis not present

## 2022-09-08 DIAGNOSIS — N1831 Chronic kidney disease, stage 3a: Secondary | ICD-10-CM | POA: Diagnosis not present

## 2022-09-08 DIAGNOSIS — Z85828 Personal history of other malignant neoplasm of skin: Secondary | ICD-10-CM | POA: Diagnosis not present

## 2022-09-08 DIAGNOSIS — H8110 Benign paroxysmal vertigo, unspecified ear: Secondary | ICD-10-CM | POA: Diagnosis not present

## 2022-09-08 DIAGNOSIS — Z9181 History of falling: Secondary | ICD-10-CM | POA: Diagnosis not present

## 2022-09-08 DIAGNOSIS — E1165 Type 2 diabetes mellitus with hyperglycemia: Secondary | ICD-10-CM | POA: Diagnosis not present

## 2022-09-08 DIAGNOSIS — E559 Vitamin D deficiency, unspecified: Secondary | ICD-10-CM | POA: Diagnosis not present

## 2022-09-08 DIAGNOSIS — Z7982 Long term (current) use of aspirin: Secondary | ICD-10-CM | POA: Diagnosis not present

## 2022-09-08 DIAGNOSIS — G20A1 Parkinson's disease without dyskinesia, without mention of fluctuations: Secondary | ICD-10-CM | POA: Diagnosis not present

## 2022-09-08 DIAGNOSIS — M6281 Muscle weakness (generalized): Secondary | ICD-10-CM | POA: Diagnosis not present

## 2022-09-10 DIAGNOSIS — I129 Hypertensive chronic kidney disease with stage 1 through stage 4 chronic kidney disease, or unspecified chronic kidney disease: Secondary | ICD-10-CM | POA: Diagnosis not present

## 2022-09-10 DIAGNOSIS — E1165 Type 2 diabetes mellitus with hyperglycemia: Secondary | ICD-10-CM | POA: Diagnosis not present

## 2022-09-10 DIAGNOSIS — M6281 Muscle weakness (generalized): Secondary | ICD-10-CM | POA: Diagnosis not present

## 2022-09-10 DIAGNOSIS — Z7902 Long term (current) use of antithrombotics/antiplatelets: Secondary | ICD-10-CM | POA: Diagnosis not present

## 2022-09-10 DIAGNOSIS — Z85828 Personal history of other malignant neoplasm of skin: Secondary | ICD-10-CM | POA: Diagnosis not present

## 2022-09-10 DIAGNOSIS — Z7982 Long term (current) use of aspirin: Secondary | ICD-10-CM | POA: Diagnosis not present

## 2022-09-10 DIAGNOSIS — G20A1 Parkinson's disease without dyskinesia, without mention of fluctuations: Secondary | ICD-10-CM | POA: Diagnosis not present

## 2022-09-10 DIAGNOSIS — E1122 Type 2 diabetes mellitus with diabetic chronic kidney disease: Secondary | ICD-10-CM | POA: Diagnosis not present

## 2022-09-10 DIAGNOSIS — E7800 Pure hypercholesterolemia, unspecified: Secondary | ICD-10-CM | POA: Diagnosis not present

## 2022-09-10 DIAGNOSIS — N1831 Chronic kidney disease, stage 3a: Secondary | ICD-10-CM | POA: Diagnosis not present

## 2022-09-10 DIAGNOSIS — M8580 Other specified disorders of bone density and structure, unspecified site: Secondary | ICD-10-CM | POA: Diagnosis not present

## 2022-09-10 DIAGNOSIS — Z9181 History of falling: Secondary | ICD-10-CM | POA: Diagnosis not present

## 2022-09-10 DIAGNOSIS — H8110 Benign paroxysmal vertigo, unspecified ear: Secondary | ICD-10-CM | POA: Diagnosis not present

## 2022-09-10 DIAGNOSIS — E559 Vitamin D deficiency, unspecified: Secondary | ICD-10-CM | POA: Diagnosis not present

## 2022-09-11 ENCOUNTER — Telehealth: Payer: Self-pay | Admitting: Family

## 2022-09-11 DIAGNOSIS — N1831 Chronic kidney disease, stage 3a: Secondary | ICD-10-CM | POA: Diagnosis not present

## 2022-09-11 DIAGNOSIS — Z85828 Personal history of other malignant neoplasm of skin: Secondary | ICD-10-CM | POA: Diagnosis not present

## 2022-09-11 DIAGNOSIS — Z7982 Long term (current) use of aspirin: Secondary | ICD-10-CM | POA: Diagnosis not present

## 2022-09-11 DIAGNOSIS — E785 Hyperlipidemia, unspecified: Secondary | ICD-10-CM

## 2022-09-11 DIAGNOSIS — M8580 Other specified disorders of bone density and structure, unspecified site: Secondary | ICD-10-CM | POA: Diagnosis not present

## 2022-09-11 DIAGNOSIS — E1122 Type 2 diabetes mellitus with diabetic chronic kidney disease: Secondary | ICD-10-CM | POA: Diagnosis not present

## 2022-09-11 DIAGNOSIS — I129 Hypertensive chronic kidney disease with stage 1 through stage 4 chronic kidney disease, or unspecified chronic kidney disease: Secondary | ICD-10-CM | POA: Diagnosis not present

## 2022-09-11 DIAGNOSIS — H8110 Benign paroxysmal vertigo, unspecified ear: Secondary | ICD-10-CM | POA: Diagnosis not present

## 2022-09-11 DIAGNOSIS — Z9181 History of falling: Secondary | ICD-10-CM | POA: Diagnosis not present

## 2022-09-11 DIAGNOSIS — M6281 Muscle weakness (generalized): Secondary | ICD-10-CM | POA: Diagnosis not present

## 2022-09-11 DIAGNOSIS — G20A1 Parkinson's disease without dyskinesia, without mention of fluctuations: Secondary | ICD-10-CM | POA: Diagnosis not present

## 2022-09-11 DIAGNOSIS — Z7902 Long term (current) use of antithrombotics/antiplatelets: Secondary | ICD-10-CM | POA: Diagnosis not present

## 2022-09-11 DIAGNOSIS — E559 Vitamin D deficiency, unspecified: Secondary | ICD-10-CM | POA: Diagnosis not present

## 2022-09-11 DIAGNOSIS — E1165 Type 2 diabetes mellitus with hyperglycemia: Secondary | ICD-10-CM | POA: Diagnosis not present

## 2022-09-11 DIAGNOSIS — E7800 Pure hypercholesterolemia, unspecified: Secondary | ICD-10-CM | POA: Diagnosis not present

## 2022-09-14 DIAGNOSIS — H8110 Benign paroxysmal vertigo, unspecified ear: Secondary | ICD-10-CM | POA: Diagnosis not present

## 2022-09-14 DIAGNOSIS — M8580 Other specified disorders of bone density and structure, unspecified site: Secondary | ICD-10-CM | POA: Diagnosis not present

## 2022-09-14 DIAGNOSIS — E7800 Pure hypercholesterolemia, unspecified: Secondary | ICD-10-CM | POA: Diagnosis not present

## 2022-09-14 DIAGNOSIS — Z9181 History of falling: Secondary | ICD-10-CM | POA: Diagnosis not present

## 2022-09-14 DIAGNOSIS — E1122 Type 2 diabetes mellitus with diabetic chronic kidney disease: Secondary | ICD-10-CM | POA: Diagnosis not present

## 2022-09-14 DIAGNOSIS — Z7902 Long term (current) use of antithrombotics/antiplatelets: Secondary | ICD-10-CM | POA: Diagnosis not present

## 2022-09-14 DIAGNOSIS — N1831 Chronic kidney disease, stage 3a: Secondary | ICD-10-CM | POA: Diagnosis not present

## 2022-09-14 DIAGNOSIS — Z85828 Personal history of other malignant neoplasm of skin: Secondary | ICD-10-CM | POA: Diagnosis not present

## 2022-09-14 DIAGNOSIS — Z7982 Long term (current) use of aspirin: Secondary | ICD-10-CM | POA: Diagnosis not present

## 2022-09-14 DIAGNOSIS — I129 Hypertensive chronic kidney disease with stage 1 through stage 4 chronic kidney disease, or unspecified chronic kidney disease: Secondary | ICD-10-CM | POA: Diagnosis not present

## 2022-09-14 DIAGNOSIS — E1165 Type 2 diabetes mellitus with hyperglycemia: Secondary | ICD-10-CM | POA: Diagnosis not present

## 2022-09-14 DIAGNOSIS — M6281 Muscle weakness (generalized): Secondary | ICD-10-CM | POA: Diagnosis not present

## 2022-09-14 DIAGNOSIS — E559 Vitamin D deficiency, unspecified: Secondary | ICD-10-CM | POA: Diagnosis not present

## 2022-09-14 DIAGNOSIS — G20A1 Parkinson's disease without dyskinesia, without mention of fluctuations: Secondary | ICD-10-CM | POA: Diagnosis not present

## 2022-09-15 DIAGNOSIS — E1165 Type 2 diabetes mellitus with hyperglycemia: Secondary | ICD-10-CM | POA: Diagnosis not present

## 2022-09-15 DIAGNOSIS — E1122 Type 2 diabetes mellitus with diabetic chronic kidney disease: Secondary | ICD-10-CM | POA: Diagnosis not present

## 2022-09-15 DIAGNOSIS — M8580 Other specified disorders of bone density and structure, unspecified site: Secondary | ICD-10-CM | POA: Diagnosis not present

## 2022-09-15 DIAGNOSIS — E559 Vitamin D deficiency, unspecified: Secondary | ICD-10-CM | POA: Diagnosis not present

## 2022-09-15 DIAGNOSIS — H8110 Benign paroxysmal vertigo, unspecified ear: Secondary | ICD-10-CM | POA: Diagnosis not present

## 2022-09-15 DIAGNOSIS — N1831 Chronic kidney disease, stage 3a: Secondary | ICD-10-CM | POA: Diagnosis not present

## 2022-09-15 DIAGNOSIS — Z9181 History of falling: Secondary | ICD-10-CM | POA: Diagnosis not present

## 2022-09-15 DIAGNOSIS — Z7982 Long term (current) use of aspirin: Secondary | ICD-10-CM | POA: Diagnosis not present

## 2022-09-15 DIAGNOSIS — G20A1 Parkinson's disease without dyskinesia, without mention of fluctuations: Secondary | ICD-10-CM | POA: Diagnosis not present

## 2022-09-15 DIAGNOSIS — M6281 Muscle weakness (generalized): Secondary | ICD-10-CM | POA: Diagnosis not present

## 2022-09-15 DIAGNOSIS — E7800 Pure hypercholesterolemia, unspecified: Secondary | ICD-10-CM | POA: Diagnosis not present

## 2022-09-15 DIAGNOSIS — I129 Hypertensive chronic kidney disease with stage 1 through stage 4 chronic kidney disease, or unspecified chronic kidney disease: Secondary | ICD-10-CM | POA: Diagnosis not present

## 2022-09-15 DIAGNOSIS — Z7902 Long term (current) use of antithrombotics/antiplatelets: Secondary | ICD-10-CM | POA: Diagnosis not present

## 2022-09-15 DIAGNOSIS — Z85828 Personal history of other malignant neoplasm of skin: Secondary | ICD-10-CM | POA: Diagnosis not present

## 2022-09-17 ENCOUNTER — Other Ambulatory Visit (INDEPENDENT_AMBULATORY_CARE_PROVIDER_SITE_OTHER): Payer: Medicare Other

## 2022-09-17 ENCOUNTER — Other Ambulatory Visit: Payer: Self-pay

## 2022-09-17 DIAGNOSIS — E785 Hyperlipidemia, unspecified: Secondary | ICD-10-CM

## 2022-09-17 DIAGNOSIS — D649 Anemia, unspecified: Secondary | ICD-10-CM | POA: Diagnosis not present

## 2022-09-17 DIAGNOSIS — E1165 Type 2 diabetes mellitus with hyperglycemia: Secondary | ICD-10-CM | POA: Diagnosis not present

## 2022-09-17 DIAGNOSIS — I129 Hypertensive chronic kidney disease with stage 1 through stage 4 chronic kidney disease, or unspecified chronic kidney disease: Secondary | ICD-10-CM | POA: Diagnosis not present

## 2022-09-17 DIAGNOSIS — Z85828 Personal history of other malignant neoplasm of skin: Secondary | ICD-10-CM | POA: Diagnosis not present

## 2022-09-17 DIAGNOSIS — M6281 Muscle weakness (generalized): Secondary | ICD-10-CM | POA: Diagnosis not present

## 2022-09-17 DIAGNOSIS — R944 Abnormal results of kidney function studies: Secondary | ICD-10-CM | POA: Diagnosis not present

## 2022-09-17 DIAGNOSIS — N1831 Chronic kidney disease, stage 3a: Secondary | ICD-10-CM | POA: Diagnosis not present

## 2022-09-17 DIAGNOSIS — Z9181 History of falling: Secondary | ICD-10-CM | POA: Diagnosis not present

## 2022-09-17 DIAGNOSIS — E7800 Pure hypercholesterolemia, unspecified: Secondary | ICD-10-CM | POA: Diagnosis not present

## 2022-09-17 DIAGNOSIS — G20A1 Parkinson's disease without dyskinesia, without mention of fluctuations: Secondary | ICD-10-CM | POA: Diagnosis not present

## 2022-09-17 DIAGNOSIS — E559 Vitamin D deficiency, unspecified: Secondary | ICD-10-CM | POA: Diagnosis not present

## 2022-09-17 DIAGNOSIS — E1122 Type 2 diabetes mellitus with diabetic chronic kidney disease: Secondary | ICD-10-CM | POA: Diagnosis not present

## 2022-09-17 DIAGNOSIS — R42 Dizziness and giddiness: Secondary | ICD-10-CM

## 2022-09-17 DIAGNOSIS — Z7982 Long term (current) use of aspirin: Secondary | ICD-10-CM | POA: Diagnosis not present

## 2022-09-17 DIAGNOSIS — H8110 Benign paroxysmal vertigo, unspecified ear: Secondary | ICD-10-CM | POA: Diagnosis not present

## 2022-09-17 DIAGNOSIS — M8580 Other specified disorders of bone density and structure, unspecified site: Secondary | ICD-10-CM | POA: Diagnosis not present

## 2022-09-17 DIAGNOSIS — Z7902 Long term (current) use of antithrombotics/antiplatelets: Secondary | ICD-10-CM | POA: Diagnosis not present

## 2022-09-17 LAB — CBC WITH DIFFERENTIAL/PLATELET
Basophils Absolute: 0.1 10*3/uL (ref 0.0–0.1)
Basophils Relative: 0.9 % (ref 0.0–3.0)
Eosinophils Absolute: 0.2 10*3/uL (ref 0.0–0.7)
Eosinophils Relative: 2.6 % (ref 0.0–5.0)
HCT: 34.5 % — ABNORMAL LOW (ref 36.0–46.0)
Hemoglobin: 11.7 g/dL — ABNORMAL LOW (ref 12.0–15.0)
Lymphocytes Relative: 32.8 % (ref 12.0–46.0)
Lymphs Abs: 2.7 10*3/uL (ref 0.7–4.0)
MCHC: 33.8 g/dL (ref 30.0–36.0)
MCV: 94.3 fl (ref 78.0–100.0)
Monocytes Absolute: 0.9 10*3/uL (ref 0.1–1.0)
Monocytes Relative: 10.6 % (ref 3.0–12.0)
Neutro Abs: 4.4 10*3/uL (ref 1.4–7.7)
Neutrophils Relative %: 53.1 % (ref 43.0–77.0)
Platelets: 222 10*3/uL (ref 150.0–400.0)
RBC: 3.65 Mil/uL — ABNORMAL LOW (ref 3.87–5.11)
RDW: 13.5 % (ref 11.5–15.5)
WBC: 8.2 10*3/uL (ref 4.0–10.5)

## 2022-09-17 LAB — IBC + FERRITIN
Ferritin: 39.2 ng/mL (ref 10.0–291.0)
Iron: 82 ug/dL (ref 42–145)
Saturation Ratios: 24.7 % (ref 20.0–50.0)
TIBC: 331.8 ug/dL (ref 250.0–450.0)
Transferrin: 237 mg/dL (ref 212.0–360.0)

## 2022-09-17 LAB — BASIC METABOLIC PANEL
BUN: 22 mg/dL (ref 6–23)
CO2: 28 mEq/L (ref 19–32)
Calcium: 9.1 mg/dL (ref 8.4–10.5)
Chloride: 108 mEq/L (ref 96–112)
Creatinine, Ser: 1.17 mg/dL (ref 0.40–1.20)
GFR: 42.89 mL/min — ABNORMAL LOW (ref >60.00)
Glucose, Bld: 103 mg/dL — ABNORMAL HIGH (ref 70–99)
Potassium: 4.4 mEq/L (ref 3.5–5.1)
Sodium: 141 mEq/L (ref 135–145)

## 2022-09-17 LAB — VITAMIN B12: Vitamin B-12: 247 pg/mL (ref 211–911)

## 2022-09-17 MED ORDER — SERTRALINE HCL 50 MG PO TABS: ORAL_TABLET | ORAL | 1 refills | Status: DC

## 2022-09-17 MED ORDER — ATENOLOL 50 MG PO TABS: ORAL_TABLET | ORAL | 0 refills | Status: DC

## 2022-09-17 MED ORDER — ROSUVASTATIN CALCIUM 10 MG PO TABS: 10.0000 mg | ORAL_TABLET | Freq: Every day | ORAL | 1 refills | Status: DC

## 2022-09-17 NOTE — Telephone Encounter (Signed)
sent 

## 2022-09-18 ENCOUNTER — Other Ambulatory Visit: Payer: Self-pay

## 2022-09-18 DIAGNOSIS — I129 Hypertensive chronic kidney disease with stage 1 through stage 4 chronic kidney disease, or unspecified chronic kidney disease: Secondary | ICD-10-CM | POA: Diagnosis not present

## 2022-09-18 DIAGNOSIS — M8580 Other specified disorders of bone density and structure, unspecified site: Secondary | ICD-10-CM | POA: Diagnosis not present

## 2022-09-18 DIAGNOSIS — E1165 Type 2 diabetes mellitus with hyperglycemia: Secondary | ICD-10-CM | POA: Diagnosis not present

## 2022-09-18 DIAGNOSIS — Z7982 Long term (current) use of aspirin: Secondary | ICD-10-CM | POA: Diagnosis not present

## 2022-09-18 DIAGNOSIS — N1831 Chronic kidney disease, stage 3a: Secondary | ICD-10-CM | POA: Diagnosis not present

## 2022-09-18 DIAGNOSIS — H8110 Benign paroxysmal vertigo, unspecified ear: Secondary | ICD-10-CM | POA: Diagnosis not present

## 2022-09-18 DIAGNOSIS — E1122 Type 2 diabetes mellitus with diabetic chronic kidney disease: Secondary | ICD-10-CM | POA: Diagnosis not present

## 2022-09-18 DIAGNOSIS — E559 Vitamin D deficiency, unspecified: Secondary | ICD-10-CM | POA: Diagnosis not present

## 2022-09-18 DIAGNOSIS — G20A1 Parkinson's disease without dyskinesia, without mention of fluctuations: Secondary | ICD-10-CM | POA: Diagnosis not present

## 2022-09-18 DIAGNOSIS — Z85828 Personal history of other malignant neoplasm of skin: Secondary | ICD-10-CM | POA: Diagnosis not present

## 2022-09-18 DIAGNOSIS — Z7902 Long term (current) use of antithrombotics/antiplatelets: Secondary | ICD-10-CM | POA: Diagnosis not present

## 2022-09-18 DIAGNOSIS — M6281 Muscle weakness (generalized): Secondary | ICD-10-CM | POA: Diagnosis not present

## 2022-09-18 DIAGNOSIS — E7800 Pure hypercholesterolemia, unspecified: Secondary | ICD-10-CM | POA: Diagnosis not present

## 2022-09-18 DIAGNOSIS — Z9181 History of falling: Secondary | ICD-10-CM | POA: Diagnosis not present

## 2022-09-21 MED ORDER — PROGESTERONE MICRONIZED 100 MG PO CAPS: 100.0000 mg | ORAL_CAPSULE | Freq: Every day | ORAL | 3 refills | Status: DC

## 2022-09-21 NOTE — Telephone Encounter (Signed)
Refill sent.

## 2022-09-21 NOTE — Telephone Encounter (Signed)
Prescription Request  09/21/2022  Is this a "Controlled Substance" medicine? No  LOV: Visit date not found  What is the name of the medication or equipment? progesterone (PROMETRIUM) 100 MG capsule   Have you contacted your pharmacy to request a refill? Yes   Which pharmacy would you like this sent to?   [id number] [place] - [address] [place]Phone: [phone]: [phone]   Patient notified that their request is being sent to the clinical staff for review and that they should receive a response within 2 business days.   [phone]

## 2022-09-22 ENCOUNTER — Telehealth: Payer: Self-pay | Admitting: Internal Medicine

## 2022-09-22 DIAGNOSIS — Z85828 Personal history of other malignant neoplasm of skin: Secondary | ICD-10-CM | POA: Diagnosis not present

## 2022-09-22 DIAGNOSIS — H8110 Benign paroxysmal vertigo, unspecified ear: Secondary | ICD-10-CM | POA: Diagnosis not present

## 2022-09-22 DIAGNOSIS — N1831 Chronic kidney disease, stage 3a: Secondary | ICD-10-CM | POA: Diagnosis not present

## 2022-09-22 DIAGNOSIS — M6281 Muscle weakness (generalized): Secondary | ICD-10-CM | POA: Diagnosis not present

## 2022-09-22 DIAGNOSIS — E1165 Type 2 diabetes mellitus with hyperglycemia: Secondary | ICD-10-CM | POA: Diagnosis not present

## 2022-09-22 DIAGNOSIS — E1122 Type 2 diabetes mellitus with diabetic chronic kidney disease: Secondary | ICD-10-CM | POA: Diagnosis not present

## 2022-09-22 DIAGNOSIS — Z9181 History of falling: Secondary | ICD-10-CM | POA: Diagnosis not present

## 2022-09-22 DIAGNOSIS — M8580 Other specified disorders of bone density and structure, unspecified site: Secondary | ICD-10-CM | POA: Diagnosis not present

## 2022-09-22 DIAGNOSIS — E559 Vitamin D deficiency, unspecified: Secondary | ICD-10-CM | POA: Diagnosis not present

## 2022-09-22 DIAGNOSIS — G20A1 Parkinson's disease without dyskinesia, without mention of fluctuations: Secondary | ICD-10-CM | POA: Diagnosis not present

## 2022-09-22 DIAGNOSIS — E7800 Pure hypercholesterolemia, unspecified: Secondary | ICD-10-CM | POA: Diagnosis not present

## 2022-09-22 DIAGNOSIS — Z7982 Long term (current) use of aspirin: Secondary | ICD-10-CM | POA: Diagnosis not present

## 2022-09-22 DIAGNOSIS — I129 Hypertensive chronic kidney disease with stage 1 through stage 4 chronic kidney disease, or unspecified chronic kidney disease: Secondary | ICD-10-CM | POA: Diagnosis not present

## 2022-09-22 DIAGNOSIS — Z7902 Long term (current) use of antithrombotics/antiplatelets: Secondary | ICD-10-CM | POA: Diagnosis not present

## 2022-09-22 NOTE — Telephone Encounter (Signed)
Lft pt vm to call ofc . thanks 

## 2022-09-23 ENCOUNTER — Ambulatory Visit
Admission: RE | Admit: 2022-09-23 | Discharge: 2022-09-23 | Disposition: A | Discharge status: Home / Self Care | Payer: Medicare Other | Source: Ambulatory Visit | Attending: Internal Medicine | Admitting: Internal Medicine

## 2022-09-23 ENCOUNTER — Telehealth: Payer: Self-pay

## 2022-09-23 DIAGNOSIS — N1831 Chronic kidney disease, stage 3a: Secondary | ICD-10-CM | POA: Insufficient documentation

## 2022-09-23 DIAGNOSIS — N189 Chronic kidney disease, unspecified: Secondary | ICD-10-CM | POA: Diagnosis not present

## 2022-09-23 DIAGNOSIS — K8020 Calculus of gallbladder without cholecystitis without obstruction: Secondary | ICD-10-CM | POA: Diagnosis not present

## 2022-09-23 NOTE — Telephone Encounter (Signed)
Graciella Belton from [facility] dropped off document to be filled out by provider Home Health order.  Document is located in providers color folder at front office.

## 2022-09-24 NOTE — Telephone Encounter (Signed)
Signed and placed in box.   

## 2022-10-05 HISTORY — PX: OTHER SURGICAL HISTORY: SHX169

## 2022-10-27 ENCOUNTER — Encounter: Payer: Self-pay | Admitting: Internal Medicine

## 2022-11-18 ENCOUNTER — Encounter: Payer: Self-pay | Admitting: Internal Medicine

## 2022-11-18 DIAGNOSIS — R93429 Abnormal radiologic findings on diagnostic imaging of unspecified kidney: Secondary | ICD-10-CM | POA: Insufficient documentation

## 2022-11-23 DIAGNOSIS — Z961 Presence of intraocular lens: Secondary | ICD-10-CM | POA: Diagnosis not present

## 2022-11-23 DIAGNOSIS — H43813 Vitreous degeneration, bilateral: Secondary | ICD-10-CM | POA: Diagnosis not present

## 2022-11-23 DIAGNOSIS — H0012 Chalazion right lower eyelid: Secondary | ICD-10-CM | POA: Diagnosis not present

## 2022-11-26 ENCOUNTER — Ambulatory Visit
Admission: RE | Admit: 2022-11-26 | Discharge: 2022-11-26 | Disposition: A | Discharge status: Home / Self Care | Payer: Medicare Other | Source: Ambulatory Visit | Attending: Internal Medicine | Admitting: Internal Medicine

## 2022-11-26 DIAGNOSIS — Z1231 Encounter for screening mammogram for malignant neoplasm of breast: Secondary | ICD-10-CM | POA: Diagnosis not present

## 2022-11-27 ENCOUNTER — Encounter: Payer: Medicare Other | Admitting: Internal Medicine

## 2022-11-30 ENCOUNTER — Other Ambulatory Visit: Payer: Self-pay | Admitting: Internal Medicine

## 2022-11-30 DIAGNOSIS — R42 Dizziness and giddiness: Secondary | ICD-10-CM | POA: Diagnosis not present

## 2022-11-30 DIAGNOSIS — R928 Other abnormal and inconclusive findings on diagnostic imaging of breast: Secondary | ICD-10-CM

## 2022-11-30 DIAGNOSIS — R921 Mammographic calcification found on diagnostic imaging of breast: Secondary | ICD-10-CM

## 2022-11-30 DIAGNOSIS — H8112 Benign paroxysmal vertigo, left ear: Secondary | ICD-10-CM | POA: Diagnosis not present

## 2022-11-30 DIAGNOSIS — H6983 Other specified disorders of Eustachian tube, bilateral: Secondary | ICD-10-CM | POA: Diagnosis not present

## 2022-11-30 NOTE — Progress Notes (Signed)
Order placed for f/u left breast mammogram with possible ultrasound.

## 2022-12-02 ENCOUNTER — Ambulatory Visit
Admission: RE | Admit: 2022-12-02 | Discharge: 2022-12-02 | Disposition: A | Discharge status: Home / Self Care | Payer: Medicare Other | Source: Ambulatory Visit | Attending: Internal Medicine | Admitting: Internal Medicine

## 2022-12-02 DIAGNOSIS — R928 Other abnormal and inconclusive findings on diagnostic imaging of breast: Secondary | ICD-10-CM | POA: Diagnosis not present

## 2022-12-02 DIAGNOSIS — R921 Mammographic calcification found on diagnostic imaging of breast: Secondary | ICD-10-CM | POA: Diagnosis not present

## 2022-12-03 ENCOUNTER — Other Ambulatory Visit: Payer: Self-pay | Admitting: Internal Medicine

## 2022-12-03 DIAGNOSIS — R928 Other abnormal and inconclusive findings on diagnostic imaging of breast: Secondary | ICD-10-CM

## 2022-12-03 DIAGNOSIS — R921 Mammographic calcification found on diagnostic imaging of breast: Secondary | ICD-10-CM

## 2022-12-04 ENCOUNTER — Encounter: Payer: Medicare Other | Admitting: Internal Medicine

## 2022-12-10 ENCOUNTER — Ambulatory Visit
Admission: RE | Admit: 2022-12-10 | Discharge: 2022-12-10 | Disposition: A | Discharge status: Home / Self Care | Payer: Medicare Other | Source: Ambulatory Visit | Attending: Internal Medicine | Admitting: Internal Medicine

## 2022-12-10 DIAGNOSIS — D242 Benign neoplasm of left breast: Secondary | ICD-10-CM | POA: Diagnosis not present

## 2022-12-10 DIAGNOSIS — R928 Other abnormal and inconclusive findings on diagnostic imaging of breast: Secondary | ICD-10-CM | POA: Insufficient documentation

## 2022-12-10 DIAGNOSIS — R921 Mammographic calcification found on diagnostic imaging of breast: Secondary | ICD-10-CM

## 2022-12-10 HISTORY — PX: BREAST BIOPSY: SHX20

## 2022-12-10 MED ORDER — LIDOCAINE-EPINEPHRINE 1 %-1:100000 IJ SOLN
20.0000 mL | Freq: Once | INTRAMUSCULAR | Status: AC
  Administered 2022-12-10: 20 mL

## 2022-12-10 MED ORDER — LIDOCAINE HCL (PF) 1 % IJ SOLN
5.0000 mL | Freq: Once | INTRAMUSCULAR | Status: AC
  Administered 2022-12-10: 5 mL

## 2022-12-11 LAB — SURGICAL PATHOLOGY

## 2022-12-15 ENCOUNTER — Other Ambulatory Visit: Payer: Self-pay | Admitting: Internal Medicine

## 2022-12-15 ENCOUNTER — Encounter: Payer: Self-pay | Admitting: Internal Medicine

## 2022-12-15 DIAGNOSIS — R42 Dizziness and giddiness: Secondary | ICD-10-CM

## 2022-12-15 DIAGNOSIS — R928 Other abnormal and inconclusive findings on diagnostic imaging of breast: Secondary | ICD-10-CM | POA: Insufficient documentation

## 2022-12-18 ENCOUNTER — Other Ambulatory Visit: Payer: Self-pay | Admitting: Internal Medicine

## 2022-12-22 DIAGNOSIS — M792 Neuralgia and neuritis, unspecified: Secondary | ICD-10-CM | POA: Diagnosis not present

## 2022-12-22 DIAGNOSIS — Z8619 Personal history of other infectious and parasitic diseases: Secondary | ICD-10-CM | POA: Diagnosis not present

## 2022-12-22 DIAGNOSIS — G20A1 Parkinson's disease without dyskinesia, without mention of fluctuations: Secondary | ICD-10-CM | POA: Diagnosis not present

## 2022-12-29 ENCOUNTER — Other Ambulatory Visit: Payer: Self-pay | Admitting: Internal Medicine

## 2022-12-29 DIAGNOSIS — E785 Hyperlipidemia, unspecified: Secondary | ICD-10-CM

## 2022-12-30 DIAGNOSIS — H0012 Chalazion right lower eyelid: Secondary | ICD-10-CM | POA: Diagnosis not present

## 2022-12-30 DIAGNOSIS — H02122 Mechanical ectropion of right lower eyelid: Secondary | ICD-10-CM | POA: Diagnosis not present

## 2023-01-07 DIAGNOSIS — H02132 Senile ectropion of right lower eyelid: Secondary | ICD-10-CM | POA: Diagnosis not present

## 2023-01-07 DIAGNOSIS — H02103 Unspecified ectropion of right eye, unspecified eyelid: Secondary | ICD-10-CM | POA: Diagnosis not present

## 2023-01-08 ENCOUNTER — Ambulatory Visit (INDEPENDENT_AMBULATORY_CARE_PROVIDER_SITE_OTHER): Payer: Medicare Other | Admitting: Internal Medicine

## 2023-01-08 ENCOUNTER — Encounter: Payer: Self-pay | Admitting: Internal Medicine

## 2023-01-08 VITALS — BP 132/78 | HR 70 | Temp 98.0°F | Resp 16 | Ht 60.0 in | Wt 127.4 lb

## 2023-01-08 DIAGNOSIS — R93429 Abnormal radiologic findings on diagnostic imaging of unspecified kidney: Secondary | ICD-10-CM | POA: Diagnosis not present

## 2023-01-08 DIAGNOSIS — D649 Anemia, unspecified: Secondary | ICD-10-CM

## 2023-01-08 DIAGNOSIS — E7800 Pure hypercholesterolemia, unspecified: Secondary | ICD-10-CM

## 2023-01-08 DIAGNOSIS — E1165 Type 2 diabetes mellitus with hyperglycemia: Secondary | ICD-10-CM | POA: Diagnosis not present

## 2023-01-08 DIAGNOSIS — R739 Hyperglycemia, unspecified: Secondary | ICD-10-CM

## 2023-01-08 DIAGNOSIS — L989 Disorder of the skin and subcutaneous tissue, unspecified: Secondary | ICD-10-CM | POA: Diagnosis not present

## 2023-01-08 DIAGNOSIS — R634 Abnormal weight loss: Secondary | ICD-10-CM | POA: Diagnosis not present

## 2023-01-08 DIAGNOSIS — R928 Other abnormal and inconclusive findings on diagnostic imaging of breast: Secondary | ICD-10-CM | POA: Diagnosis not present

## 2023-01-08 DIAGNOSIS — Z0000 Encounter for general adult medical examination without abnormal findings: Secondary | ICD-10-CM | POA: Diagnosis not present

## 2023-01-08 DIAGNOSIS — R42 Dizziness and giddiness: Secondary | ICD-10-CM

## 2023-01-08 DIAGNOSIS — I10 Essential (primary) hypertension: Secondary | ICD-10-CM | POA: Diagnosis not present

## 2023-01-08 DIAGNOSIS — F439 Reaction to severe stress, unspecified: Secondary | ICD-10-CM

## 2023-01-08 DIAGNOSIS — N1831 Chronic kidney disease, stage 3a: Secondary | ICD-10-CM

## 2023-01-08 MED ORDER — SERTRALINE HCL 50 MG PO TABS: ORAL_TABLET | ORAL | 1 refills | Status: DC

## 2023-01-08 MED ORDER — ATENOLOL 50 MG PO TABS: 50.0000 mg | ORAL_TABLET | Freq: Every day | ORAL | 1 refills | Status: DC

## 2023-01-08 MED ORDER — PROGESTERONE MICRONIZED 100 MG PO CAPS: 100.0000 mg | ORAL_CAPSULE | Freq: Every day | ORAL | 1 refills | Status: DC

## 2023-01-08 NOTE — Assessment & Plan Note (Signed)
Physical today 01/08/23.  Mammogram due.  Last 12/07/18 - Briads I.  Ordered.  Need to schedule.  Bone density ordered to be done with mammogram.  Colonoscopy 02/2013.

## 2023-01-08 NOTE — Progress Notes (Unsigned)
Subjective:    Patient ID: Kara Dixon, female    DOB: 10-Nov-1935, 87 y.o.   MRN: [id number]  Patient here for  Chief Complaint  Patient presents with   Annual Exam    HPI Here for physical exam.  She is accompanied by her neighbor/caretaker.  History obtained from both of them.  Husband recently passed.  Overall doing ok.  Feels she is handling things relatively well.  Trying to stay active.  Using her cane.  Denies any chest pain or sob.  States she can walk up inclines/stairs - no pain or sob.  No increased cough or congestion.  Eating.  States has a good appetite.  She is planning to have eye surgery.  Goes for a pre op evaluation 01/14/23.  Had eye surgery - previously left eye.  Is due to see dermatology - lesion on wrist and on bridge of nose.  They are planning to schedule appt with dermatology.  Has seen neurology.  Recommended continuing sinemet.     Past Medical History:  Diagnosis Date   Cancer    skin   Hypercholesterolemia    Hypertension    Nephrolithiasis    Osteopenia    Vitamin D deficiency    Past Surgical History:  Procedure Laterality Date   BREAST BIOPSY Left 12/10/2022   LEFT stereo bx, calcs, "COIL" clip-path pending   BREAST BIOPSY Left 12/10/2022   MM LT BREAST BX W LOC DEV 1ST LESION IMAGE BX SPEC STEREO GUIDE 12/10/2022 [facility]   CATARACT EXTRACTION W/PHACO Right 02/11/2021   Procedure: CATARACT EXTRACTION PHACO AND INTRAOCULAR LENS PLACEMENT (IOC) RIGHT;  Surgeon: Galen ManilaPorfilio, William, MD;  Location: [facility];  Service: Ophthalmology;  Laterality: Right;  CDE15.19 01:16.1 minutes   CATARACT EXTRACTION W/PHACO Left 02/25/2021   Procedure: CATARACT EXTRACTION PHACO AND INTRAOCULAR LENS PLACEMENT (IOC) LEFT;  Surgeon: Galen ManilaPorfilio, William, MD;  [facility];  Service: Ophthalmology;  Laterality: Left;  18.25 01:23.4   cyst removed under tongue  age 89   EYE SURGERY  02/2017   EYE LID; care everywhere   Family History   Problem Relation Age of Onset   Stroke Mother    Hypertension Mother    Fibromyalgia Sister    Breast cancer Neg Hx    Colon cancer Neg Hx    Social History   Socioeconomic History   Marital status: Widowed    Spouse name: Not on file   Number of children: 1   Years of education: Not on file   Highest education level: Not on file  Occupational History   Not on file  Tobacco Use   Smoking status: Never   Smokeless tobacco: Never  Vaping Use   Vaping Use: Never used  Substance and Sexual Activity   Alcohol use: No    Alcohol/week: 0.0 standard drinks of alcohol   Drug use: No   Sexual activity: Not Currently  Other Topics Concern   Not on file  Social History Narrative   Not on file   Social Determinants of Health   Financial Resource Strain: Low Risk  (07/27/2022)   Overall Financial Resource Strain (CARDIA)    Difficulty of Paying Living Expenses: Not hard at all  Food Insecurity: No Food Insecurity (07/27/2022)   Hunger Vital Sign    Worried About Running Out of Food in the Last Year: Never true    Ran Out of Food in the Last Year: Never true  Transportation Needs: No Transportation Needs (07/27/2022)  PRAPARE - Administrator, Civil ServiceTransportation    Lack of Transportation (Medical): No    Lack of Transportation (Non-Medical): No  Physical Activity: Unknown (05/12/2021)   Exercise Vital Sign    Days of Exercise per Week: 0 days    Minutes of Exercise per Session: Not on file  Stress: No Stress Concern Present (07/27/2022)   [employer] - Occupational Stress Questionnaire    Feeling of Stress : Only a little  Social Connections: Unknown (07/27/2022)   Social Connection and Isolation Panel [NHANES]    Frequency of Communication with Friends and Family: Not on file    Frequency of Social Gatherings with Friends and Family: Not on file    Attends Religious Services: Not on file    Active Member of Clubs or Organizations: Not on file    Attends Tax inspectorClub or  Organization Meetings: Not on file    Marital Status: Married     Review of Systems  Constitutional:  Negative for appetite change and unexpected weight change.  HENT:  Negative for congestion, sinus pressure and sore throat.   Eyes:  Negative for pain and visual disturbance.  Respiratory:  Negative for cough, chest tightness and shortness of breath.   Cardiovascular:  Negative for chest pain and palpitations.  Gastrointestinal:  Negative for abdominal pain, diarrhea, nausea and vomiting.  Genitourinary:  Negative for difficulty urinating and dysuria.  Musculoskeletal:  Negative for joint swelling and myalgias.  Skin:  Negative for color change and rash.  Neurological:  Negative for dizziness and headaches.  Hematological:  Negative for adenopathy. Does not bruise/bleed easily.  Psychiatric/Behavioral:  Negative for agitation and dysphoric mood.        Objective:     BP 132/78   Pulse 70   Temp 98 F (36.7 C)   Resp 16   Ht 5' (1.524 m)   Wt 127 lb 6.4 oz (57.8 kg)   SpO2 98%   BMI 24.88 kg/m  Wt Readings from Last 3 Encounters:  01/08/23 127 lb 6.4 oz (57.8 kg)  08/21/22 114 lb 9.6 oz (52 kg)  07/27/22 110 lb (49.9 kg)    Physical Exam Vitals reviewed.  Constitutional:      General: She is not in acute distress.    Appearance: Normal appearance. She is well-developed.  HENT:     Head: Normocephalic and atraumatic.     Right Ear: External ear normal.     Left Ear: External ear normal.  Eyes:     General: No scleral icterus.       Right eye: No discharge.        Left eye: No discharge.     Conjunctiva/sclera: Conjunctivae normal.  Neck:     Thyroid: No thyromegaly.  Cardiovascular:     Rate and Rhythm: Normal rate and regular rhythm.  Pulmonary:     Effort: No tachypnea, accessory muscle usage or respiratory distress.     Breath sounds: Normal breath sounds. No decreased breath sounds or wheezing.  Chest:  Breasts:    Right: No inverted nipple, mass,  nipple discharge or tenderness (no axillary adenopathy).     Left: No inverted nipple, mass, nipple discharge or tenderness (no axilarry adenopathy).  Abdominal:     General: Bowel sounds are normal.     Palpations: Abdomen is soft.     Tenderness: There is no abdominal tenderness.  Musculoskeletal:        General: No swelling or tenderness.     Cervical back: Neck  supple.  Lymphadenopathy:     Cervical: No cervical adenopathy.  Skin:    Findings: No erythema or rash.  Neurological:     Mental Status: She is alert and oriented to person, place, and time.  Psychiatric:        Mood and Affect: Mood normal.        Behavior: Behavior normal.      Outpatient Encounter Medications as of 01/08/2023  Medication Sig   carbidopa-levodopa (SINEMET IR) 25-100 MG tablet Take 1 tablet by mouth 3 (three) times daily.   aspirin 81 MG tablet Take 81 mg by mouth daily.   atenolol (TENORMIN) 50 MG tablet Take 1 tablet (50 mg total) by mouth daily.   busPIRone (BUSPAR) 5 MG tablet Take 1 tablet (5 mg total) by mouth daily as needed.   Cyanocobalamin (B-12 COMPLIANCE INJECTION) 1000 MCG/ML KIT Inject as directed.   fexofenadine (ALLEGRA) 180 MG tablet Take 180 mg by mouth as needed.    lactase (LACTAID) 3000 UNITS tablet Take 1 tablet by mouth as needed.   ondansetron (ZOFRAN-ODT) 4 MG disintegrating tablet Take 1 tablet (4 mg total) by mouth every 8 (eight) hours as needed for nausea or vomiting.   pregabalin (LYRICA) 50 MG capsule Take 50 mg by mouth 2 (two) times daily.   progesterone (PROMETRIUM) 100 MG capsule Take 1 capsule (100 mg total) by mouth daily.   rosuvastatin (CRESTOR) 10 MG tablet TAKE 1 TABLET BY MOUTH EVERY DAY   sertraline (ZOLOFT) 50 MG tablet TAKE 1/2 (25MG ) BY MOUTH ONCE DAILY FOR 10 DAYS THEN INCREASE TO 1 DAILY   [DISCONTINUED] atenolol (TENORMIN) 50 MG tablet TAKE 1 TABLET BY MOUTH EVERY DAY   [DISCONTINUED] PREGABALIN PO Take by mouth.   [DISCONTINUED] progesterone  (PROMETRIUM) 100 MG capsule TAKE 1 CAPSULE BY MOUTH EVERY DAY   [DISCONTINUED] sertraline (ZOLOFT) 50 MG tablet TAKE 1/2 (25MG ) BY MOUTH ONCE DAILY FOR 10 DAYS THEN INCREASE TO 1 DAILY   No facility-administered encounter medications on file as of 01/08/2023.     Lab Results  Component Value Date   WBC 8.3 01/08/2023   HGB 12.0 01/08/2023   HCT 36.3 01/08/2023   PLT 234 01/08/2023   GLUCOSE 104 (H) 01/08/2023   CHOL 123 01/08/2023   TRIG 75 01/08/2023   HDL 64 01/08/2023   LDLDIRECT 139.4 05/12/2013   LDLCALC 43 01/08/2023   ALT 5 (L) 01/08/2023   AST 13 01/08/2023   NA 142 01/08/2023   K 4.2 01/08/2023   CL 107 01/08/2023   CREATININE 1.09 (H) 01/08/2023   BUN 16 01/08/2023   CO2 28 01/08/2023   TSH 2.73 01/08/2023   HGBA1C 6.1 (H) 01/08/2023   MICROALBUR 3.5 (H) 12/29/2021    MM LT BREAST BX W LOC DEV 1ST LESION IMAGE BX SPEC STEREO GUIDE  Addendum Date: 12/14/2022   ADDENDUM REPORT: 12/14/2022 09:45 ADDENDUM: PATHOLOGY revealed: A. BREAST, "LEFT OUTER", STEREOTACTIC CORE BIOPSY: - FIBROADENOMATOID CHANGES WITH COARSE DYSTROPHIC CALCIFICATIONS. - FATTY BREAST TISSUE. Pathology results are CONCORDANT with imaging findings, per Dr. Meda Klinefelter. Pathology results and recommendations were discussed with patient (and caregiver "Darlene) via telephone on 12/11/2022. Patient reported biopsy site doing well with no adverse symptoms, and only slight tenderness at the site. Post biopsy care instructions were reviewed, questions were answered and my direct phone number was provided. Patient was instructed to call [facility] for any additional questions or concerns related to biopsy site. RECOMMENDATION: Patient instructed to continue monthly self  breast examinations and resume annual bilateral screening mammogram due February 2025. Pathology results reported by Randa Lynn RN on 12/11/2022. Electronically Signed   By: Meda Klinefelter M.D.   On: 12/14/2022 09:45   Result Date:  12/14/2022 CLINICAL DATA:  Indeterminate LEFT breast calcifications EXAM: LEFT BREAST STEREOTACTIC CORE NEEDLE BIOPSY COMPARISON:  Previous exam(s). FINDINGS: The patient and I discussed the procedure of stereotactic-guided biopsy including benefits and alternatives. We discussed the high likelihood of a successful procedure. We discussed the risks of the procedure including infection, bleeding, tissue injury, clip migration, and inadequate sampling. Informed written consent was given. The usual time out protocol was performed immediately prior to the procedure. Using sterile technique and 1% lidocaine and 1% lidocaine with epinephrine as local anesthetic, under stereotactic guidance, a 9 gauge vacuum assisted device was used to perform core needle biopsy of calcifications in the lower outer quadrant of the left breast using a lateral approach. Specimen radiograph was performed showing representative calcifications in 4 of 5 samples. Specimens with calcifications are identified for pathology. Lesion quadrant: Lower outer quadrant At the conclusion of the procedure, a COIL shaped tissue marker clip was deployed into the biopsy cavity. Follow-up 2-view mammogram was performed and dictated separately. IMPRESSION: Stereotactic-guided biopsy of indeterminate calcifications. No apparent complications. Electronically Signed: By: Meda Klinefelter M.D. On: 12/10/2022 13:33   MM CLIP PLACEMENT LEFT  Result Date: 12/10/2022 CLINICAL DATA:  Status post stereotactic guided biopsy EXAM: 3D DIAGNOSTIC LEFT MAMMOGRAM POST STEREOTACTIC BIOPSY COMPARISON:  Previous exam(s). FINDINGS: 3D Mammographic images were obtained following stereotactic guided biopsy of indeterminate calcifications. The COIL biopsy marking clip is in expected position at the site of biopsy. IMPRESSION: Appropriate positioning of the COIL shaped biopsy marking clip at the site of biopsy in the outer breast. Final Assessment: Post Procedure Mammograms for  Marker Placement Electronically Signed   By: Meda Klinefelter M.D.   On: 12/10/2022 13:39      Assessment & Plan:  Routine general medical examination at a health care facility  Vertigo -     Atenolol; Take 1 tablet (50 mg total) by mouth daily.  Dispense: 90 tablet; Refill: 1  Anemia, unspecified type -     CBC with Differential/Platelet -     BASIC METABOLIC PANEL WITH eGFR -     TSH  Hypercholesterolemia Assessment & Plan: On crestor. Follow lipid panel and liver function tests.   Lab Results  Component Value Date   CHOL 123 01/08/2023   HDL 64 01/08/2023   LDLCALC 43 01/08/2023   LDLDIRECT 139.4 05/12/2013   TRIG 75 01/08/2023   CHOLHDL 1.9 01/08/2023    Orders: -     Hepatic function panel -     Lipid panel  Type 2 diabetes mellitus with hyperglycemia, without long-term current use of insulin Assessment & Plan: Appetite is better.  Follow met b and a1c.   Orders: -     Hemoglobin A1c  Health care maintenance Assessment & Plan: Physical today 01/08/23.  Mammogram 12/02/22 - recommended f/u left breast mammogram.  Left breast mammogram 12/02/22 - recommended biopsy.  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 - FATTY BREAST TISSUE. Patient instructed to continue monthly self breast examinations and resume annual bilateral screening mammogram due February 2025. Colonoscopy 02/2013.     Abnormal mammogram Assessment & Plan: Mammogram 12/02/22 - recommended f/u left breast mammogram.  Left breast mammogram 12/02/22 - recommended biopsy.  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 -  FATTY BREAST TISSUE. Patient instructed to continue monthly self breast examinations and resume annual bilateral screening mammogram due February 2025.   Abnormal renal ultrasound Assessment & Plan: Renal ultrasound 09/23/22. The right kidney is unremarkable. A left-sided 4 mm non shadowing echogenic focus could  represent artifact or a tiny stone. No hydronephrosis on the left. Debris in the bladder is nonspecific. Discussed with urology - no further w/up.    Stage 3a chronic kidney disease Assessment & Plan: Avoid antiinflammatories.  Stay hydrated.  Follow metabolic panel.    Hyperglycemia Assessment & Plan: Low carb diet and exercise.  Follow met b and a1c.    Primary hypertension Assessment & Plan: Blood pressure doing well on atenolol.  Remain off amlodipine.  Follow pressures.  Follow metabolic panel.    Stress Assessment & Plan: On zoloft. Feels she is doing well. Follow.    Weight loss Assessment & Plan: Weight increased.  Appetite better.  Follow    Skin lesion Assessment & Plan: Wrist and bridge of nose - skin lesions.  Dermatology evaluation.  Calling for appt.    Other orders -     Progesterone; Take 1 capsule (100 mg total) by mouth daily.  Dispense: 90 capsule; Refill: 1 -     Sertraline HCl; TAKE 1/2 (25MG ) BY MOUTH ONCE DAILY FOR 10 DAYS THEN INCREASE TO 1 DAILY  Dispense: 90 tablet; Refill: 1     [place]ale [place], MD

## 2023-01-09 ENCOUNTER — Encounter: Payer: Self-pay | Admitting: Internal Medicine

## 2023-01-09 LAB — HEMOGLOBIN A1C
Hgb A1c MFr Bld: 6.1 % of total Hgb — ABNORMAL HIGH (ref <5.7)
Mean Plasma Glucose: 128 mg/dL
eAG (mmol/L): 7.1 mmol/L

## 2023-01-09 LAB — LIPID PANEL
Cholesterol: 123 mg/dL (ref <200)
HDL: 64 mg/dL (ref >=50)
LDL Cholesterol (Calc): 43 mg/dL (calc)
Non-HDL Cholesterol (Calc): 59 mg/dL (calc) (ref <130)
Total CHOL/HDL Ratio: 1.9 (calc) (ref <5.0)
Triglycerides: 75 mg/dL (ref <150)

## 2023-01-09 LAB — BASIC METABOLIC PANEL WITH GFR
BUN/Creatinine Ratio: 15 (calc) (ref 6–22)
BUN: 16 mg/dL (ref 7–25)
CO2: 28 mmol/L (ref 20–32)
Calcium: 9.2 mg/dL (ref 8.6–10.4)
Chloride: 107 mmol/L (ref 98–110)
Creat: 1.09 mg/dL — ABNORMAL HIGH (ref 0.60–0.95)
Glucose, Bld: 104 mg/dL — ABNORMAL HIGH (ref 65–99)
Potassium: 4.2 mmol/L (ref 3.5–5.3)
Sodium: 142 mmol/L (ref 135–146)
eGFR: 50 mL/min/{1.73_m2} — ABNORMAL LOW (ref >=60)

## 2023-01-09 LAB — HEPATIC FUNCTION PANEL
AG Ratio: 1.4 (calc) (ref 1.0–2.5)
ALT: 5 U/L — ABNORMAL LOW (ref 6–29)
AST: 13 U/L (ref 10–35)
Albumin: 4.1 g/dL (ref 3.6–5.1)
Alkaline phosphatase (APISO): 83 U/L (ref 37–153)
Bilirubin, Direct: 0.1 mg/dL (ref 0.0–0.2)
Globulin: 3 g/dL (calc) (ref 1.9–3.7)
Indirect Bilirubin: 0.2 mg/dL (calc) (ref 0.2–1.2)
Total Bilirubin: 0.3 mg/dL (ref 0.2–1.2)
Total Protein: 7.1 g/dL (ref 6.1–8.1)

## 2023-01-09 LAB — CBC WITH DIFFERENTIAL/PLATELET
Absolute Monocytes: 780 cells/uL (ref 200–950)
Basophils Absolute: 33 cells/uL (ref 0–200)
Basophils Relative: 0.4 %
Eosinophils Absolute: 199 cells/uL (ref 15–500)
Eosinophils Relative: 2.4 %
HCT: 36.3 % (ref 35.0–45.0)
Hemoglobin: 12 g/dL (ref 11.7–15.5)
Lymphs Abs: 3818 cells/uL (ref 850–3900)
MCH: 31 pg (ref 27.0–33.0)
MCHC: 33.1 g/dL (ref 32.0–36.0)
MCV: 93.8 fL (ref 80.0–100.0)
MPV: 13 fL — ABNORMAL HIGH (ref 7.5–12.5)
Monocytes Relative: 9.4 %
Neutro Abs: 3469 cells/uL (ref 1500–7800)
Neutrophils Relative %: 41.8 %
Platelets: 234 10*3/uL (ref 140–400)
RBC: 3.87 10*6/uL (ref 3.80–5.10)
RDW: 12.5 % (ref 11.0–15.0)
Total Lymphocyte: 46 %
WBC: 8.3 10*3/uL (ref 3.8–10.8)

## 2023-01-09 LAB — TSH: TSH: 2.73 mIU/L (ref 0.40–4.50)

## 2023-01-09 NOTE — Assessment & Plan Note (Signed)
Weight increased.  Appetite better.  Follow

## 2023-01-09 NOTE — Assessment & Plan Note (Signed)
On crestor. Follow lipid panel and liver function tests.   Lab Results  Component Value Date   CHOL 123 01/08/2023   HDL 64 01/08/2023   LDLCALC 43 01/08/2023   LDLDIRECT 139.4 05/12/2013   TRIG 75 01/08/2023   CHOLHDL 1.9 01/08/2023

## 2023-01-09 NOTE — Assessment & Plan Note (Signed)
Avoid antiinflammatories.  Stay hydrated.  Follow metabolic panel.   

## 2023-01-09 NOTE — Assessment & Plan Note (Signed)
Renal ultrasound 09/23/22. The right kidney is unremarkable. A left-sided 4 mm non shadowing echogenic focus could represent artifact or a tiny stone. No hydronephrosis on the left. Debris in the bladder is nonspecific. Discussed with urology - no further w/up.

## 2023-01-09 NOTE — Assessment & Plan Note (Signed)
Wrist and bridge of nose - skin lesions.  Dermatology evaluation.  Calling for appt.

## 2023-01-09 NOTE — Assessment & Plan Note (Signed)
Blood pressure doing well on atenolol.  Remain off amlodipine.  Follow pressures.  Follow metabolic panel.  

## 2023-01-09 NOTE — Assessment & Plan Note (Signed)
On zoloft.  Feels she is doing well.  Follow.   

## 2023-01-09 NOTE — Assessment & Plan Note (Signed)
Mammogram 12/02/22 - recommended f/u left breast mammogram.  Left breast mammogram 12/02/22 - recommended biopsy.  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 - FATTY BREAST TISSUE. Patient instructed to continue monthly self breast examinations and resume annual bilateral screening mammogram due February 2025.

## 2023-01-09 NOTE — Assessment & Plan Note (Signed)
Appetite is better.  Follow met b and a1c.  

## 2023-01-09 NOTE — Assessment & Plan Note (Signed)
Low carb diet and exercise.  Follow met b and a1c.   

## 2023-01-14 DIAGNOSIS — H02132 Senile ectropion of right lower eyelid: Secondary | ICD-10-CM | POA: Diagnosis not present

## 2023-01-19 ENCOUNTER — Encounter: Payer: Self-pay | Admitting: Ophthalmology

## 2023-01-20 NOTE — Discharge Instructions (Signed)

## 2023-01-20 NOTE — Anesthesia Preprocedure Evaluation (Addendum)
Anesthesia Evaluation  Patient identified by MRN, date of birth, ID band Patient awake    Reviewed: Allergy & Precautions, H&P , NPO status , Patient's Chart, lab work & pertinent test results  Airway Mallampati: IV  TM Distance: <3 FB Neck ROM: Full  Mouth opening: Limited Mouth Opening Comment: Literally cannot see a thing in the mouth; extremely limited oral opening Dental   Patient states she has caps and crowns, but cannot see these, cannot see any of her teeth due to limited oral opening:   Pulmonary neg pulmonary ROS   Pulmonary exam normal breath sounds clear to auscultation       Cardiovascular hypertension, Pt. on home beta blockers and Pt. on medications Normal cardiovascular exam Rhythm:Regular Rate:Normal     Neuro/Psych        Sadness, husband passed away in 14-Nov-2022. Parkinson's disease Vertigo  negative neurological ROS  negative psych ROS   GI/Hepatic negative GI ROS, Neg liver ROS,,,  Endo/Other  diabetes, Type 2, Oral Hypoglycemic Agents    Renal/GU Renal diseasenegative Renal ROS  negative genitourinary   Musculoskeletal negative musculoskeletal ROS (+)    Abdominal   Peds negative pediatric ROS (+)  Hematology negative hematology ROS (+)   Anesthesia Other Findings Hypertension  Hypercholesterolemia Nephrolithiasis  Osteopenia Vitamin D deficiency  Cancer Parkinson's disease  Vertigo Type 2 diabetes mellitus  CKD (chronic kidney disease), stage III    Reproductive/Obstetrics negative OB ROS                              Anesthesia Physical Anesthesia Plan  ASA: 2  Anesthesia Plan: MAC   Post-op Pain Management:    Induction: Intravenous  PONV Risk Score and Plan:   Airway Management Planned: Natural Airway and Nasal Cannula  Additional Equipment:   Intra-op Plan:   Post-operative Plan:   Informed Consent: I have reviewed the patients History  and Physical, chart, labs and discussed the procedure including the risks, benefits and alternatives for the proposed anesthesia with the patient or authorized representative who has indicated his/her understanding and acceptance.     Dental Advisory Given  Plan Discussed with: Anesthesiologist, CRNA and Surgeon  Anesthesia Plan Comments: (Patient consented for risks of anesthesia including but not limited to:  - adverse reactions to medications - damage to eyes, teeth, lips or other oral mucosa - nerve damage due to positioning  - sore throat or hoarseness - Damage to heart, brain, nerves, lungs, other parts of body or loss of life  Patient voiced understanding.)         Anesthesia Quick Evaluation

## 2023-01-22 ENCOUNTER — Ambulatory Visit: Payer: Medicare Other | Admitting: Anesthesiology

## 2023-01-22 ENCOUNTER — Encounter
Admission: RE | Disposition: A | Discharge status: Home / Self Care | Payer: Self-pay | Source: Home / Self Care | Attending: Ophthalmology

## 2023-01-22 ENCOUNTER — Ambulatory Visit
Admission: RE | Admit: 2023-01-22 | Discharge: 2023-01-22 | Disposition: A | Discharge status: Home / Self Care | Payer: Medicare Other | Attending: Ophthalmology | Admitting: Ophthalmology

## 2023-01-22 ENCOUNTER — Other Ambulatory Visit: Payer: Self-pay

## 2023-01-22 DIAGNOSIS — E119 Type 2 diabetes mellitus without complications: Secondary | ICD-10-CM | POA: Diagnosis not present

## 2023-01-22 DIAGNOSIS — E1122 Type 2 diabetes mellitus with diabetic chronic kidney disease: Secondary | ICD-10-CM | POA: Insufficient documentation

## 2023-01-22 DIAGNOSIS — I129 Hypertensive chronic kidney disease with stage 1 through stage 4 chronic kidney disease, or unspecified chronic kidney disease: Secondary | ICD-10-CM | POA: Insufficient documentation

## 2023-01-22 DIAGNOSIS — Z79899 Other long term (current) drug therapy: Secondary | ICD-10-CM | POA: Diagnosis not present

## 2023-01-22 DIAGNOSIS — H02532 Eyelid retraction right lower eyelid: Secondary | ICD-10-CM | POA: Diagnosis not present

## 2023-01-22 DIAGNOSIS — Z7984 Long term (current) use of oral hypoglycemic drugs: Secondary | ICD-10-CM | POA: Insufficient documentation

## 2023-01-22 DIAGNOSIS — N183 Chronic kidney disease, stage 3 unspecified: Secondary | ICD-10-CM | POA: Insufficient documentation

## 2023-01-22 DIAGNOSIS — G20A1 Parkinson's disease without dyskinesia, without mention of fluctuations: Secondary | ICD-10-CM | POA: Insufficient documentation

## 2023-01-22 DIAGNOSIS — I10 Essential (primary) hypertension: Secondary | ICD-10-CM | POA: Diagnosis not present

## 2023-01-22 DIAGNOSIS — E7800 Pure hypercholesterolemia, unspecified: Secondary | ICD-10-CM | POA: Diagnosis not present

## 2023-01-22 DIAGNOSIS — H04521 Eversion of right lacrimal punctum: Secondary | ICD-10-CM | POA: Insufficient documentation

## 2023-01-22 DIAGNOSIS — H02132 Senile ectropion of right lower eyelid: Secondary | ICD-10-CM | POA: Diagnosis not present

## 2023-01-22 DIAGNOSIS — H02102 Unspecified ectropion of right lower eyelid: Secondary | ICD-10-CM | POA: Diagnosis not present

## 2023-01-22 HISTORY — DX: Type 2 diabetes mellitus without complications: E11.9

## 2023-01-22 HISTORY — DX: Parkinson's disease without dyskinesia, without mention of fluctuations: G20.A1

## 2023-01-22 HISTORY — DX: Chronic kidney disease, stage 3 unspecified: N18.30

## 2023-01-22 HISTORY — PX: ECTROPION REPAIR: SHX357

## 2023-01-22 HISTORY — DX: Dizziness and giddiness: R42

## 2023-01-22 SURGERY — REPAIR, ECTROPION, EYELID
Anesthesia: Monitor Anesthesia Care | Site: Eye | Laterality: Right

## 2023-01-22 MED ORDER — ERYTHROMYCIN 5 MG/GM OP OINT
TOPICAL_OINTMENT | OPHTHALMIC | Status: DC | PRN
  Administered 2023-01-22: 1 via OPHTHALMIC

## 2023-01-22 MED ORDER — ERYTHROMYCIN 5 MG/GM OP OINT: TOPICAL_OINTMENT | OPHTHALMIC | 2 refills | Status: DC

## 2023-01-22 MED ORDER — BSS IO SOLN
INTRAOCULAR | Status: DC | PRN
  Administered 2023-01-22: 30 mL

## 2023-01-22 MED ORDER — TRAMADOL HCL 50 MG PO TABS: ORAL_TABLET | ORAL | 0 refills | Status: DC

## 2023-01-22 MED ORDER — PROPOFOL 500 MG/50ML IV EMUL
INTRAVENOUS | Status: DC | PRN
  Administered 2023-01-22: 100 ug/kg/min via INTRAVENOUS

## 2023-01-22 MED ORDER — LACTATED RINGERS IV SOLN: INTRAVENOUS | Status: DC

## 2023-01-22 MED ORDER — MIDAZOLAM HCL 2 MG/2ML IJ SOLN
INTRAMUSCULAR | Status: DC | PRN
  Administered 2023-01-22: .5 mg via INTRAVENOUS

## 2023-01-22 MED ORDER — LIDOCAINE-EPINEPHRINE 2 %-1:100000 IJ SOLN
INTRAMUSCULAR | Status: DC | PRN
  Administered 2023-01-22: 3 mL via OPHTHALMIC

## 2023-01-22 MED ORDER — TETRACAINE HCL 0.5 % OP SOLN
OPHTHALMIC | Status: DC | PRN
  Administered 2023-01-22: 2 [drp] via OPHTHALMIC

## 2023-01-22 SURGICAL SUPPLY — 34 items
APPLICATOR COTTON TIP WD 3 STR (MISCELLANEOUS) ×2 IMPLANT
BLADE SURG 15 STRL LF DISP TIS (BLADE) ×1 IMPLANT
BLADE SURG 15 STRL SS (BLADE) ×1
CORD BIP STRL DISP 12FT (MISCELLANEOUS) ×1 IMPLANT
GAUZE SPONGE 4X4 12PLY STRL (GAUZE/BANDAGES/DRESSINGS) ×1 IMPLANT
GLOVE SURG UNDER POLY LF SZ7 (GLOVE) ×2 IMPLANT
MARKER SKIN XFINE TIP W/RULER (MISCELLANEOUS) ×1 IMPLANT
NDL FILTER BLUNT 18X1 1/2 (NEEDLE) ×1 IMPLANT
NDL HYPO 30X.5 LL (NEEDLE) ×2 IMPLANT
NEEDLE FILTER BLUNT 18X1 1/2 (NEEDLE) ×1 IMPLANT
NEEDLE HYPO 30X.5 LL (NEEDLE) ×2 IMPLANT
PACK ENT CUSTOM (PACKS) ×1 IMPLANT
SOL PREP PVP 2OZ (MISCELLANEOUS) ×1
SOLUTION PREP PVP 2OZ (MISCELLANEOUS) ×1 IMPLANT
SPONGE GAUZE 2X2 8PLY STRL LF (GAUZE/BANDAGES/DRESSINGS) ×10 IMPLANT
SUT CHROMIC 4-0 (SUTURE) ×1
SUT CHROMIC 4-0 M2 12X2 ARM (SUTURE) ×1
SUT CHROMIC 5 0 P 3 (SUTURE) IMPLANT
SUT ETHILON 4 0 CL P 3 (SUTURE) IMPLANT
SUT GUT PLAIN 6-0 1X18 ABS (SUTURE) ×1 IMPLANT
SUT MERSILENE 4-0 S-2 (SUTURE) ×1 IMPLANT
SUT PDS AB 4-0 P3 18 (SUTURE) IMPLANT
SUT PROLENE 5 0 P 3 (SUTURE) IMPLANT
SUT PROLENE 6 0 P 1 18 (SUTURE) IMPLANT
SUT SILK 4 0 G 3 (SUTURE) IMPLANT
SUT VIC AB 5-0 P-3 18X BRD (SUTURE) IMPLANT
SUT VIC AB 5-0 P3 18 (SUTURE)
SUT VICRYL 6-0  S14 CTD (SUTURE)
SUT VICRYL 6-0 S14 CTD (SUTURE) IMPLANT
SUT VICRYL 7 0 TG140 8 (SUTURE) IMPLANT
SUTURE CHRMC 4-0 M2 12X2 ARM (SUTURE) IMPLANT
SYR 10ML LL (SYRINGE) ×1 IMPLANT
SYR 3ML LL SCALE MARK (SYRINGE) ×1 IMPLANT
WATER STERILE IRR 250ML POUR (IV SOLUTION) ×1 IMPLANT

## 2023-01-22 NOTE — Anesthesia Postprocedure Evaluation (Signed)
Anesthesia Post Note  Patient: Kara Dixon  Procedure(s) Performed: ECTROPION REPAIR, TARSAL WEDGE ECTROPION REPAIR, EXTENSIVE RIGHT LOWER LID (Right: Eye)  Patient location during evaluation: PACU Anesthesia Type: MAC Level of consciousness: awake and alert Pain management: pain level controlled Vital Signs Assessment: post-procedure vital signs reviewed and stable Respiratory status: spontaneous breathing, nonlabored ventilation, respiratory function stable and patient connected to nasal cannula oxygen Cardiovascular status: stable and blood pressure returned to baseline Postop Assessment: no apparent nausea or vomiting Anesthetic complications: no   No notable events documented.   Last Vitals:  Vitals:   01/22/23 1225 01/22/23 1230  BP:  (!) 152/70  Pulse: 66 70  Resp: 15 17  Temp:  (!) 36.4 C  SpO2: 99% 99%    Last Pain:  Vitals:   01/22/23 1230  TempSrc:   PainSc: 0-No pain                 Drenda Sobecki C Lennie Vasco

## 2023-01-22 NOTE — Op Note (Signed)
Preoperative Diagnosis:   1.  Lower eyelid laxity with ectropion, right  lower eyelid(s). 2.  Punctal eversion right  lower eyelid(s)  Postoperative Diagnosis:   Same.  Procedure(s) Performed:  1.  Lateral tarsal strip procedure,  right   lower eyelid(s) 2.  Medial spindle (tarsal wedge excision) right  lower eyelid(s)  Surgeon: Hubbard Robinson. Ether Griffins, M.D.  Assistants: none  Anesthesia: MAC  Specimens: None.  Estimated Blood Loss: Minimal.  Complications: None.  Operative Findings: None   Procedure:   Allergies were reviewed and the patient Avelox [moxifloxacin hcl in nacl], Mucinex [guaifenesin er], Penicillins, Sulfa antibiotics, Lotemax [loteprednol], and Maxitrol [neomycin-polymyxin-dexameth].    After discussing the risks, benefits, complications, and alternatives with the patient, appropriate informed consent was obtained. The patient was brought to the operating suite and reclined supine. Time out was conducted and the patient was sedated.  Local anesthetic consisting of a 50-50 mixture of 2% lidocaine with epinephrine and 0.75% bupivacaine with added Hylenex was injected subcutaneously to the right  lateral canthal region(s) and lower eyelid(s). Additional anesthetic was injected subconjunctivally to the right  lower eyelid(s). Finally, anesthetic was injected down to the periosteum of the right  lateral orbital rim(s).  After adequate local was instilled, the patient was prepped and draped in the usual sterile fashion for eyelid surgery. Attention was turned to the right  lateral canthal angle. Westcott scissors were used to create a lateral canthotomy. Hemostasis was obtained with bipolar cautery. An inferior cantholysis was then performed with additional bipolar hemostasis. The anterior and posterior lamella of the lid were divided for approximately 8 mm.  A strip of the epithelium was excised off the superior margin of the tarsal strip and conjunctiva and retractors were incised  off the inferior margin of the tarsal strip.  Attention was then turned to the medial portion of the lid. An ellipse of conjunctiva, retractors and inferior tarsal margin was excised inferior to the punctum. Hemostasis was obtained with bipolar cautery. A double-armed 4-0 chromic suture was then passed through the tarsal edge first then the conjunctival and retractor borders and finally from posterior to anterior through the skin and tied off. This provided nice inward rotation of the lower eyelid medially   A double-armed 4-0 Mersilene suture was then passed each arm through the terminal portion of the tarsal strip. Each arm of the suture was then passed through the periosteum of the inner portion of the lateral orbital rim at the level of Whitnall's tubercle. The sutures were advanced and this provided nice elevation and tightening of the lower eyelid. Once the suture was secured, a thin strip of follicle-bearing skin was excised. The lateral canthal angle was reformed with an interrupted 6-0 plain gut suture. Orbicularis was reapproximated with horizontal subcuticular 6-0  plain gut sutures. The skin was closed with interrupted 6-0 plain gut sutures.   The patient tolerated the procedure well. Erythromycin ophthalmic ointment was applied to the incision site(s) followed by ice packs. The patient was taken to the recovery area where she recovered without difficulty.  Post-Op Plan/Instructions:  The patient was instructed to use ice packs frequently for the next 48 hours. She was instructed to use Erythromycin ophthalmic ointment on her incisions 4 times a day for the next 12 to 14 days. She was given a prescription for tramadol (or similar) for pain control should Tylenol not be effective. She was asked to to follow up at the [facility] in [place], [place] in 2-3 weeks' time or  sooner as needed for problems.  Sergio Hobart M. Ether Griffins, M.D. Ophthalmology

## 2023-01-22 NOTE — Transfer of Care (Signed)
Immediate Anesthesia Transfer of Care Note  Patient: Kara Dixon  Procedure(s) Performed: ECTROPION REPAIR, TARSAL WEDGE ECTROPION REPAIR, EXTENSIVE RIGHT LOWER LID (Right: Eye)  Patient Location: PACU  Anesthesia Type: MAC  Level of Consciousness: awake, alert  and patient cooperative  Airway and Oxygen Therapy: Patient Spontanous Breathing and Patient connected to supplemental oxygen  Post-op Assessment: Post-op Vital signs reviewed, Patient's Cardiovascular Status Stable, Respiratory Function Stable, Patent Airway and No signs of Nausea or vomiting  Post-op Vital Signs: Reviewed and stable  Complications: No notable events documented.

## 2023-01-25 ENCOUNTER — Encounter: Payer: Self-pay | Admitting: Ophthalmology

## 2023-03-31 DIAGNOSIS — D485 Neoplasm of uncertain behavior of skin: Secondary | ICD-10-CM | POA: Diagnosis not present

## 2023-03-31 DIAGNOSIS — D0461 Carcinoma in situ of skin of right upper limb, including shoulder: Secondary | ICD-10-CM | POA: Diagnosis not present

## 2023-03-31 DIAGNOSIS — D2272 Melanocytic nevi of left lower limb, including hip: Secondary | ICD-10-CM | POA: Diagnosis not present

## 2023-03-31 DIAGNOSIS — D2262 Melanocytic nevi of left upper limb, including shoulder: Secondary | ICD-10-CM | POA: Diagnosis not present

## 2023-03-31 DIAGNOSIS — L570 Actinic keratosis: Secondary | ICD-10-CM | POA: Diagnosis not present

## 2023-03-31 DIAGNOSIS — D2271 Melanocytic nevi of right lower limb, including hip: Secondary | ICD-10-CM | POA: Diagnosis not present

## 2023-03-31 DIAGNOSIS — D2261 Melanocytic nevi of right upper limb, including shoulder: Secondary | ICD-10-CM | POA: Diagnosis not present

## 2023-03-31 DIAGNOSIS — D225 Melanocytic nevi of trunk: Secondary | ICD-10-CM | POA: Diagnosis not present

## 2023-03-31 DIAGNOSIS — L821 Other seborrheic keratosis: Secondary | ICD-10-CM | POA: Diagnosis not present

## 2023-03-31 DIAGNOSIS — C44311 Basal cell carcinoma of skin of nose: Secondary | ICD-10-CM | POA: Diagnosis not present

## 2023-05-17 ENCOUNTER — Ambulatory Visit
Admission: RE | Admit: 2023-05-17 | Discharge: 2023-05-17 | Disposition: A | Discharge status: Home / Self Care | Payer: Medicare Other | Source: Ambulatory Visit | Attending: Internal Medicine | Admitting: Internal Medicine

## 2023-05-17 ENCOUNTER — Ambulatory Visit: Payer: Medicare Other | Admitting: Internal Medicine

## 2023-05-17 VITALS — BP 118/72 | HR 60 | Temp 97.9°F | Resp 16 | Ht 60.0 in | Wt 119.4 lb

## 2023-05-17 DIAGNOSIS — R93429 Abnormal radiologic findings on diagnostic imaging of unspecified kidney: Secondary | ICD-10-CM

## 2023-05-17 DIAGNOSIS — R739 Hyperglycemia, unspecified: Secondary | ICD-10-CM

## 2023-05-17 DIAGNOSIS — Z79899 Other long term (current) drug therapy: Secondary | ICD-10-CM

## 2023-05-17 DIAGNOSIS — R928 Other abnormal and inconclusive findings on diagnostic imaging of breast: Secondary | ICD-10-CM | POA: Diagnosis not present

## 2023-05-17 DIAGNOSIS — Z79818 Long term (current) use of other agents affecting estrogen receptors and estrogen levels: Secondary | ICD-10-CM

## 2023-05-17 DIAGNOSIS — I10 Essential (primary) hypertension: Secondary | ICD-10-CM | POA: Diagnosis not present

## 2023-05-17 DIAGNOSIS — N1831 Chronic kidney disease, stage 3a: Secondary | ICD-10-CM | POA: Diagnosis not present

## 2023-05-17 DIAGNOSIS — S0990XA Unspecified injury of head, initial encounter: Secondary | ICD-10-CM | POA: Insufficient documentation

## 2023-05-17 DIAGNOSIS — R42 Dizziness and giddiness: Secondary | ICD-10-CM | POA: Diagnosis not present

## 2023-05-17 DIAGNOSIS — E7800 Pure hypercholesterolemia, unspecified: Secondary | ICD-10-CM | POA: Diagnosis not present

## 2023-05-17 DIAGNOSIS — E1165 Type 2 diabetes mellitus with hyperglycemia: Secondary | ICD-10-CM

## 2023-05-17 DIAGNOSIS — F439 Reaction to severe stress, unspecified: Secondary | ICD-10-CM

## 2023-05-17 NOTE — Progress Notes (Signed)
Subjective:    Patient ID: Kara Dixon, female    DOB: 1936-05-07, 87 y.o.   MRN: [id number]  Patient here for  Chief Complaint  Patient presents with   Medical Management of Chronic Issues    HPI Here to follow up regarding hypercholesterolemia, diabetes and hypertension.  On atenolol for blood pressure.  Remains on zoloft.  Had recent fall. Fell 3-4 weeks ago.  Was in her bed.  Reached for a water bottle.  Fell down - out of bed.  Unwitnessed fall.  Did hit her head. Had a small laceration.  Called her neighbor.  Came over - Kara Dixon had already placed an ice pack on her head.  Some bleeding.  Was not evaluated.  Denies any other significant injury.  Reports having dizziness.  Notices more with certain position changes and movements.  Has a history of vertigo.  Discussed concern given recent fall/head injury.  No headache.  No increased sinus symptoms.  No chest pain or sob reported.  Eating.  No abdominal pain or bowel change reported.     Past Medical History:  Diagnosis Date   Cancer (HCC)    skin   CKD (chronic kidney disease), stage III (HCC)    Hypercholesterolemia    Hypertension    Nephrolithiasis    Osteopenia    Parkinson's disease    Type 2 diabetes mellitus (HCC)    Vertigo    Vitamin D deficiency    Past Surgical History:  Procedure Laterality Date   BREAST BIOPSY Left 12/10/2022   LEFT stereo bx, calcs, "COIL" clip-path pending   BREAST BIOPSY Left 12/10/2022   MM LT BREAST BX W LOC DEV 1ST LESION IMAGE BX SPEC STEREO GUIDE 12/10/2022 [facility]   CATARACT EXTRACTION W/PHACO Right 02/11/2021   Procedure: CATARACT EXTRACTION PHACO AND INTRAOCULAR LENS PLACEMENT (IOC) RIGHT;  Surgeon: Galen Manila, MD;  Location: [facility];  Service: Ophthalmology;  Laterality: Right;  CDE15.19 01:16.1 minutes   CATARACT EXTRACTION W/PHACO Left 02/25/2021   Procedure: CATARACT EXTRACTION PHACO AND INTRAOCULAR LENS PLACEMENT (IOC) LEFT;  Surgeon: Galen Manila, MD;  Location: [facility];  Service: Ophthalmology;  Laterality: Left;  18.25 01:23.4   cyst removed under tongue  age 53   ECTROPION REPAIR Right 01/22/2023   Procedure: ECTROPION REPAIR, TARSAL WEDGE ECTROPION REPAIR, EXTENSIVE RIGHT LOWER LID;  Surgeon: Imagene Riches, MD;  Location: [facility];  Service: Ophthalmology;  Laterality: Right;   EYE SURGERY  02/2017   EYE LID; care everywhere   Family History  Problem Relation Age of Onset   Stroke Mother    Hypertension Mother    Fibromyalgia Sister    Breast cancer Neg Hx    Colon cancer Neg Hx    Social History   Socioeconomic History   Marital status: Widowed    Spouse name: Not on file   Number of children: 1   Years of education: Not on file   Highest education level: Not on file  Occupational History   Not on file  Tobacco Use   Smoking status: Never   Smokeless tobacco: Never  Vaping Use   Vaping status: Never Used  Substance and Sexual Activity   Alcohol use: No    Alcohol/week: 0.0 standard drinks of alcohol   Drug use: No   Sexual activity: Not Currently  Other Topics Concern   Not on file  Social History Narrative   Not on file   Social Determinants of Health  Financial Resource Strain: Low Risk  (05/14/2023)   Overall Financial Resource Strain (CARDIA)    Difficulty of Paying Living Expenses: Not hard at all  Food Insecurity: No Food Insecurity (05/14/2023)   Hunger Vital Sign    Worried About Running Out of Food in the Last Year: Never true    Ran Out of Food in the Last Year: Never true  Transportation Needs: No Transportation Needs (05/14/2023)   PRAPARE - Administrator, Civil Service (Medical): No    Lack of Transportation (Non-Medical): No  Physical Activity: Insufficiently Active (05/14/2023)   Exercise Vital Sign    Days of Exercise per Week: 1 day    Minutes of Exercise per Session: 10 min  Stress: No Stress Concern Present (05/14/2023)   [employer] of  Occupational Health - Occupational Stress Questionnaire    Feeling of Stress : Only a little  Social Connections: Socially Isolated (05/14/2023)   Social Connection and Isolation Panel [NHANES]    Frequency of Communication with Friends and Family: More than three times a week    Frequency of Social Gatherings with Friends and Family: More than three times a week    Attends Religious Services: Never    Database administrator or Organizations: No    Attends Banker Meetings: Not on file    Marital Status: Widowed     Review of Systems  Constitutional:  Negative for appetite change and unexpected weight change.  HENT:  Negative for congestion and sinus pressure.   Respiratory:  Negative for cough, chest tightness and shortness of breath.   Cardiovascular:  Negative for chest pain, palpitations and leg swelling.  Gastrointestinal:  Negative for abdominal pain, diarrhea, nausea and vomiting.  Genitourinary:  Negative for difficulty urinating and dysuria.  Musculoskeletal:  Negative for joint swelling and myalgias.  Skin:  Negative for color change and rash.  Neurological:  Positive for dizziness. Negative for headaches.  Psychiatric/Behavioral:  Negative for agitation and dysphoric mood.        Objective:     BP 118/72   Pulse 60   Temp 97.9 F (36.6 C)   Resp 16   Ht 5' (1.524 m)   Wt 119 lb 6.4 oz (54.2 kg)   SpO2 98%   BMI 23.32 kg/m  Wt Readings from Last 3 Encounters:  05/17/23 119 lb 6.4 oz (54.2 kg)  01/22/23 126 lb (57.2 kg)  01/08/23 127 lb 6.4 oz (57.8 kg)    Physical Exam   Outpatient Encounter Medications as of 05/17/2023  Medication Sig   aspirin 81 MG tablet Take 81 mg by mouth daily.   atenolol (TENORMIN) 50 MG tablet Take 1 tablet (50 mg total) by mouth daily.   busPIRone (BUSPAR) 5 MG tablet Take 1 tablet (5 mg total) by mouth daily as needed.   carbidopa-levodopa (SINEMET IR) 25-100 MG tablet Take 1 tablet by mouth 3 (three) times daily.    Cholecalciferol (VITAMIN D3 SUPER STRENGTH) 50 MCG (2000 UT) TABS Take by mouth at bedtime.   Cyanocobalamin (B-12 COMPLIANCE INJECTION) 1000 MCG/ML KIT Inject as directed.   erythromycin ophthalmic ointment Apply to sutures 4 times a day for 10-12 days.  Discontinue if allergy develops and call our office   fexofenadine (ALLEGRA) 180 MG tablet Take 180 mg by mouth as needed.    lactase (LACTAID) 3000 UNITS tablet Take 1 tablet by mouth as needed.   ondansetron (ZOFRAN-ODT) 4 MG disintegrating tablet Take 1 tablet (4 mg  total) by mouth every 8 (eight) hours as needed for nausea or vomiting.   pregabalin (LYRICA) 50 MG capsule Take 50 mg by mouth 2 (two) times daily.   progesterone (PROMETRIUM) 100 MG capsule Take 1 capsule (100 mg total) by mouth daily.   rosuvastatin (CRESTOR) 10 MG tablet TAKE 1 TABLET BY MOUTH EVERY DAY   sertraline (ZOLOFT) 50 MG tablet TAKE 1/2 (25MG ) BY MOUTH ONCE DAILY FOR 10 DAYS THEN INCREASE TO 1 DAILY   [DISCONTINUED] traMADol (ULTRAM) 50 MG tablet Take 1 every 4-6 hours as needed for pain not controlled by Tylenol   No facility-administered encounter medications on file as of 05/17/2023.     Lab Results  Component Value Date   WBC 8.3 01/08/2023   HGB 12.0 01/08/2023   HCT 36.3 01/08/2023   PLT 234 01/08/2023   GLUCOSE 103 (H) 05/19/2023   CHOL 114 05/19/2023   TRIG 106.0 05/19/2023   HDL 48.10 05/19/2023   LDLDIRECT 139.4 05/12/2013   LDLCALC 45 05/19/2023   ALT 12 05/19/2023   AST 13 05/19/2023   NA 139 05/19/2023   K 4.2 05/19/2023   CL 105 05/19/2023   CREATININE 1.07 05/19/2023   BUN 14 05/19/2023   CO2 27 05/19/2023   TSH 2.73 01/08/2023   HGBA1C 6.1 05/19/2023   MICROALBUR <0.7 05/19/2023    No results found.     Assessment & Plan:  Hypercholesterolemia Assessment & Plan: On crestor. Follow lipid panel and liver function tests.   Lab Results  Component Value Date   CHOL 114 05/19/2023   HDL 48.10 05/19/2023   LDLCALC 45  05/19/2023   LDLDIRECT 139.4 05/12/2013   TRIG 106.0 05/19/2023   CHOLHDL 2 05/19/2023    Orders: -     Hepatic function panel; Future -     Lipid panel; Future  Primary hypertension Assessment & Plan: Blood pressure doing well on atenolol.  Remain off amlodipine.  Continues on atenolol. Follow pressures.  Follow metabolic panel.    Type 2 diabetes mellitus with hyperglycemia, without long-term current use of insulin (HCC) Assessment & Plan: Appetite is better.  Follow met b and a1c.   Orders: -     Basic metabolic panel; Future -     Hemoglobin A1c; Future -     Microalbumin / creatinine urine ratio; Future  Traumatic injury of head, initial encounter Assessment & Plan: Reports increased dizziness now.  Does feel similar to her vertigo, but with recent fall/head injury - will obtain CT scan to confirm no acute abnormality.  CT today. Has appt with ENT this week.  Discussed slow position changes and movements.   Orders: -     CT HEAD WO CONTRAST ( ); Future  Abnormal mammogram Assessment & Plan: Mammogram 12/02/22 - recommended f/u left breast mammogram.  Left breast mammogram 12/02/22 - recommended biopsy.  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 - FATTY BREAST TISSUE. Patient instructed to continue monthly self breast examinations and resume annual bilateral screening mammogram due February 2025.   Abnormal renal ultrasound Assessment & Plan: Renal ultrasound 09/23/22. The right kidney is unremarkable. A left-sided 4 mm non shadowing echogenic focus could represent artifact or a tiny stone. No hydronephrosis on the left. Debris in the bladder is nonspecific. Discussed with urology - no further w/up.    Stage 3a chronic kidney disease (HCC) Assessment & Plan: Avoid antiinflammatories.  Stay hydrated.  Follow metabolic panel.    Current use of estrogen therapy Assessment &  Plan: She is currently on progesterone. Not on estrogen.   Discussed coming off progesterone.    Dizziness Assessment & Plan: Previously saw ENT - 04/2022 - dx with BPPV.  S/p epley maneuvers. Reports increased dizziness now.  Does feel similar to her vertigo, but with recent fall/head injury - will obtain CT scan to confirm no acute abnormality.  CT today. Has appt with ENT this week.  Discussed slow position changes and movements.    Hyperglycemia Assessment & Plan: Low carb diet and exercise.  Follow met b and a1c.    Stress Assessment & Plan: On zoloft. Feels she is doing well. Follow.    Vertigo Assessment & Plan: Reports increased dizziness now.  Does feel similar to her vertigo, but with recent fall/head injury - will obtain CT scan to confirm no acute abnormality.  CT today. Has appt with ENT this week.  Discussed slow position changes and movements.       Dale [place], MD

## 2023-05-19 ENCOUNTER — Other Ambulatory Visit (INDEPENDENT_AMBULATORY_CARE_PROVIDER_SITE_OTHER): Payer: Medicare Other

## 2023-05-19 DIAGNOSIS — E7800 Pure hypercholesterolemia, unspecified: Secondary | ICD-10-CM

## 2023-05-19 DIAGNOSIS — E1165 Type 2 diabetes mellitus with hyperglycemia: Secondary | ICD-10-CM

## 2023-05-19 DIAGNOSIS — H8111 Benign paroxysmal vertigo, right ear: Secondary | ICD-10-CM | POA: Diagnosis not present

## 2023-05-19 LAB — MICROALBUMIN / CREATININE URINE RATIO
Creatinine,U: 90.5 mg/dL
Microalb Creat Ratio: 0.8 mg/g (ref 0.0–30.0)
Microalb, Ur: <0.7 mg/dL (ref 0.0–1.9)

## 2023-05-19 LAB — BASIC METABOLIC PANEL
BUN: 14 mg/dL (ref 6–23)
CO2: 27 mEq/L (ref 19–32)
Calcium: 9.3 mg/dL (ref 8.4–10.5)
Chloride: 105 mEq/L (ref 96–112)
Creatinine, Ser: 1.07 mg/dL (ref 0.40–1.20)
GFR: 47.52 mL/min — ABNORMAL LOW (ref >60.00)
Glucose, Bld: 103 mg/dL — ABNORMAL HIGH (ref 70–99)
Potassium: 4.2 mEq/L (ref 3.5–5.1)
Sodium: 139 mEq/L (ref 135–145)

## 2023-05-19 LAB — LIPID PANEL
Cholesterol: 114 mg/dL (ref 0–200)
HDL: 48.1 mg/dL (ref >39.00)
LDL Cholesterol: 45 mg/dL (ref 0–99)
NonHDL: 66.01
Total CHOL/HDL Ratio: 2
Triglycerides: 106 mg/dL (ref 0.0–149.0)
VLDL: 21.2 mg/dL (ref 0.0–40.0)

## 2023-05-19 LAB — HEPATIC FUNCTION PANEL
ALT: 12 U/L (ref 0–35)
AST: 13 U/L (ref 0–37)
Albumin: 4.2 g/dL (ref 3.5–5.2)
Alkaline Phosphatase: 66 U/L (ref 39–117)
Bilirubin, Direct: 0.1 mg/dL (ref 0.0–0.3)
Total Bilirubin: 0.7 mg/dL (ref 0.2–1.2)
Total Protein: 7.4 g/dL (ref 6.0–8.3)

## 2023-05-19 LAB — HEMOGLOBIN A1C: Hgb A1c MFr Bld: 6.1 % (ref 4.6–6.5)

## 2023-05-20 ENCOUNTER — Telehealth: Payer: Self-pay

## 2023-05-20 NOTE — Telephone Encounter (Signed)
-----   Message from [place] sent at 05/20/2023  4:59 AM EDT ----- Notify - kidney function is stable.  Overall sugar control stable.  Cholesterol levels look good.  Liver function tests are wnl.

## 2023-05-23 ENCOUNTER — Encounter: Payer: Self-pay | Admitting: Internal Medicine

## 2023-05-23 NOTE — Assessment & Plan Note (Signed)
On zoloft.  Feels she is doing well.  Follow.

## 2023-05-23 NOTE — Assessment & Plan Note (Signed)
On crestor. Follow lipid panel and liver function tests.   Lab Results  Component Value Date   CHOL 114 05/19/2023   HDL 48.10 05/19/2023   LDLCALC 45 05/19/2023   LDLDIRECT 139.4 05/12/2013   TRIG 106.0 05/19/2023   CHOLHDL 2 05/19/2023

## 2023-05-23 NOTE — Assessment & Plan Note (Signed)
Appetite is better.  Follow met b and a1c.  

## 2023-05-23 NOTE — Assessment & Plan Note (Signed)
Renal ultrasound 09/23/22. The right kidney is unremarkable. A left-sided 4 mm non shadowing echogenic focus could represent artifact or a tiny stone. No hydronephrosis on the left. Debris in the bladder is nonspecific. Discussed with urology - no further w/up.

## 2023-05-23 NOTE — Assessment & Plan Note (Signed)
Previously saw ENT - 04/2022 - dx with BPPV.  S/p epley maneuvers. Reports increased dizziness now.  Does feel similar to her vertigo, but with recent fall/head injury - will obtain CT scan to confirm no acute abnormality.  CT today. Has appt with ENT this week.  Discussed slow position changes and movements.

## 2023-05-23 NOTE — Assessment & Plan Note (Signed)
Blood pressure doing well on atenolol.  Remain off amlodipine.  Continues on atenolol. Follow pressures.  Follow metabolic panel.

## 2023-05-23 NOTE — Assessment & Plan Note (Signed)
Reports increased dizziness now.  Does feel similar to her vertigo, but with recent fall/head injury - will obtain CT scan to confirm no acute abnormality.  CT today. Has appt with ENT this week.  Discussed slow position changes and movements.

## 2023-05-23 NOTE — Assessment & Plan Note (Signed)
She is currently on progesterone. Not on estrogen.  Discussed coming off progesterone.

## 2023-05-23 NOTE — Assessment & Plan Note (Signed)
Low carb diet and exercise.  Follow met b and a1c.   

## 2023-05-23 NOTE — Assessment & Plan Note (Signed)
Mammogram 12/02/22 - recommended f/u left breast mammogram.  Left breast mammogram 12/02/22 - recommended biopsy.  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 - FATTY BREAST TISSUE. Patient instructed to continue monthly self breast examinations and resume annual bilateral screening mammogram due February 2025.

## 2023-05-23 NOTE — Assessment & Plan Note (Signed)
Avoid antiinflammatories.  Stay hydrated.  Follow metabolic panel.   

## 2023-05-24 DIAGNOSIS — H814 Vertigo of central origin: Secondary | ICD-10-CM | POA: Diagnosis not present

## 2023-05-24 DIAGNOSIS — R262 Difficulty in walking, not elsewhere classified: Secondary | ICD-10-CM | POA: Diagnosis not present

## 2023-05-25 DIAGNOSIS — H814 Vertigo of central origin: Secondary | ICD-10-CM | POA: Diagnosis not present

## 2023-05-25 DIAGNOSIS — R262 Difficulty in walking, not elsewhere classified: Secondary | ICD-10-CM | POA: Diagnosis not present

## 2023-05-26 DIAGNOSIS — D0461 Carcinoma in situ of skin of right upper limb, including shoulder: Secondary | ICD-10-CM | POA: Diagnosis not present

## 2023-05-28 DIAGNOSIS — H814 Vertigo of central origin: Secondary | ICD-10-CM | POA: Diagnosis not present

## 2023-05-28 DIAGNOSIS — R262 Difficulty in walking, not elsewhere classified: Secondary | ICD-10-CM | POA: Diagnosis not present

## 2023-06-02 DIAGNOSIS — R262 Difficulty in walking, not elsewhere classified: Secondary | ICD-10-CM | POA: Diagnosis not present

## 2023-06-02 DIAGNOSIS — H814 Vertigo of central origin: Secondary | ICD-10-CM | POA: Diagnosis not present

## 2023-06-03 DIAGNOSIS — R262 Difficulty in walking, not elsewhere classified: Secondary | ICD-10-CM | POA: Diagnosis not present

## 2023-06-03 DIAGNOSIS — H814 Vertigo of central origin: Secondary | ICD-10-CM | POA: Diagnosis not present

## 2023-06-04 DIAGNOSIS — H814 Vertigo of central origin: Secondary | ICD-10-CM | POA: Diagnosis not present

## 2023-06-04 DIAGNOSIS — R262 Difficulty in walking, not elsewhere classified: Secondary | ICD-10-CM | POA: Diagnosis not present

## 2023-06-14 DIAGNOSIS — C44311 Basal cell carcinoma of skin of nose: Secondary | ICD-10-CM | POA: Diagnosis not present

## 2023-06-14 DIAGNOSIS — L814 Other melanin hyperpigmentation: Secondary | ICD-10-CM | POA: Diagnosis not present

## 2023-06-14 DIAGNOSIS — L578 Other skin changes due to chronic exposure to nonionizing radiation: Secondary | ICD-10-CM | POA: Diagnosis not present

## 2023-06-24 DIAGNOSIS — E538 Deficiency of other specified B group vitamins: Secondary | ICD-10-CM | POA: Diagnosis not present

## 2023-06-24 DIAGNOSIS — Z9181 History of falling: Secondary | ICD-10-CM | POA: Diagnosis not present

## 2023-06-24 DIAGNOSIS — Z8619 Personal history of other infectious and parasitic diseases: Secondary | ICD-10-CM | POA: Diagnosis not present

## 2023-06-24 DIAGNOSIS — M792 Neuralgia and neuritis, unspecified: Secondary | ICD-10-CM | POA: Diagnosis not present

## 2023-06-24 DIAGNOSIS — G20A1 Parkinson's disease without dyskinesia, without mention of fluctuations: Secondary | ICD-10-CM | POA: Diagnosis not present

## 2023-06-24 DIAGNOSIS — L989 Disorder of the skin and subcutaneous tissue, unspecified: Secondary | ICD-10-CM | POA: Diagnosis not present

## 2023-06-24 DIAGNOSIS — E1165 Type 2 diabetes mellitus with hyperglycemia: Secondary | ICD-10-CM | POA: Diagnosis not present

## 2023-07-21 ENCOUNTER — Ambulatory Visit: Payer: Medicare Other | Admitting: Internal Medicine

## 2023-07-21 ENCOUNTER — Encounter: Payer: Self-pay | Admitting: Internal Medicine

## 2023-07-21 VITALS — BP 128/72 | HR 60 | Temp 98.3°F | Resp 16 | Ht 60.0 in | Wt 118.6 lb

## 2023-07-21 DIAGNOSIS — R42 Dizziness and giddiness: Secondary | ICD-10-CM

## 2023-07-21 DIAGNOSIS — R739 Hyperglycemia, unspecified: Secondary | ICD-10-CM

## 2023-07-21 DIAGNOSIS — N1831 Chronic kidney disease, stage 3a: Secondary | ICD-10-CM | POA: Diagnosis not present

## 2023-07-21 DIAGNOSIS — F439 Reaction to severe stress, unspecified: Secondary | ICD-10-CM

## 2023-07-21 DIAGNOSIS — E1165 Type 2 diabetes mellitus with hyperglycemia: Secondary | ICD-10-CM

## 2023-07-21 DIAGNOSIS — E559 Vitamin D deficiency, unspecified: Secondary | ICD-10-CM | POA: Diagnosis not present

## 2023-07-21 DIAGNOSIS — I10 Essential (primary) hypertension: Secondary | ICD-10-CM

## 2023-07-21 DIAGNOSIS — Z23 Encounter for immunization: Secondary | ICD-10-CM

## 2023-07-21 DIAGNOSIS — E7800 Pure hypercholesterolemia, unspecified: Secondary | ICD-10-CM | POA: Diagnosis not present

## 2023-07-21 NOTE — Progress Notes (Signed)
Subjective:    Patient ID: Kara Dixon, female    DOB: [birth year], 87 y.o.   MRN: [id number]  Patient here for  Chief Complaint  Patient presents with   Medical Management of Chronic Issues    HPI Here to follow up regarding hypercholesterolemia, diabetes and hypertension. On atenolol for blood pressure. Remains on zoloft. She is accompanied by her neighbor.  History obtained from both of them. Just saw neurology 06/24/23.  F/u parkinsons.  Recommended continuing sinemet. Also recommended continuing aspirin and crestor. Issues with BPPV (saw ENT).  Recommended PT.  Denies chest pain.  Breathing stable.  No cough or congestion.  Eating.  No abdominal pain or bowel change reported.    Past Medical History:  Diagnosis Date   Cancer (HCC)    skin   CKD (chronic kidney disease), stage III (HCC)    Hypercholesterolemia    Hypertension    Nephrolithiasis    Osteopenia    Parkinson's disease (HCC)    Type 2 diabetes mellitus (HCC)    Vertigo    Vitamin D deficiency    Past Surgical History:  Procedure Laterality Date   BREAST BIOPSY Left 12/10/2022   LEFT stereo bx, calcs, "COIL" clip-path pending   BREAST BIOPSY Left 12/10/2022   MM LT BREAST BX W LOC DEV 1ST LESION IMAGE BX SPEC STEREO GUIDE 12/10/2022 [facility]   CATARACT EXTRACTION W/PHACO Right 02/11/2021   Procedure: CATARACT EXTRACTION PHACO AND INTRAOCULAR LENS PLACEMENT (IOC) RIGHT;  Surgeon: Galen Manila, MD;  Location: [facility];  Service: Ophthalmology;  Laterality: Right;  CDE15.19 01:16.1 minutes   CATARACT EXTRACTION W/PHACO Left 02/25/2021   Procedure: CATARACT EXTRACTION PHACO AND INTRAOCULAR LENS PLACEMENT (IOC) LEFT;  Surgeon: Galen Manila, MD;  Location: [facility];  Service: Ophthalmology;  Laterality: Left;  18.25 01:23.4   cyst removed under tongue  age 33   ECTROPION REPAIR Right 01/22/2023   Procedure: ECTROPION REPAIR, TARSAL WEDGE ECTROPION REPAIR, EXTENSIVE RIGHT LOWER  LID;  Surgeon: Imagene Riches, MD;  Location: [facility];  Service: Ophthalmology;  Laterality: Right;   EYE SURGERY  02/2017   EYE LID; care everywhere   Family History  Problem Relation Age of Onset   Stroke Mother    Hypertension Mother    Fibromyalgia Sister    Breast cancer Neg Hx    Colon cancer Neg Hx    Social History   Socioeconomic History   Marital status: Widowed    Spouse name: Not on file   Number of children: 1   Years of education: Not on file   Highest education level: Associate degree: occupational, Scientist, product/process development, or vocational program  Occupational History   Not on file  Tobacco Use   Smoking status: Never   Smokeless tobacco: Never  Vaping Use   Vaping status: Never Used  Substance and Sexual Activity   Alcohol use: No    Alcohol/week: 0.0 standard drinks of alcohol   Drug use: No   Sexual activity: Not Currently  Other Topics Concern   Not on file  Social History Narrative   Not on file   Social Determinants of Health   Financial Resource Strain: Low Risk  (07/17/2023)   Overall Financial Resource Strain (CARDIA)    Difficulty of Paying Living Expenses: Not hard at all  Food Insecurity: No Food Insecurity (07/17/2023)   Hunger Vital Sign    Worried About Running Out of Food in the Last Year: Never true    Ran Out  of Food in the Last Year: Never true  Transportation Needs: No Transportation Needs (07/17/2023)   PRAPARE - Administrator, Civil Service (Medical): No    Lack of Transportation (Non-Medical): No  Physical Activity: Insufficiently Active (07/17/2023)   Exercise Vital Sign    Days of Exercise per Week: 2 days    Minutes of Exercise per Session: 10 min  Stress: No Stress Concern Present (07/17/2023)   [employer] of Occupational Health - Occupational Stress Questionnaire    Feeling of Stress : Only a little  Social Connections: Moderately Isolated (07/17/2023)   Social Connection and Isolation Panel [NHANES]     Frequency of Communication with Friends and Family: More than three times a week    Frequency of Social Gatherings with Friends and Family: More than three times a week    Attends Religious Services: 1 to 4 times per year    Active Member of [employer] or Organizations: No    Attends Banker Meetings: Not on file    Marital Status: Widowed     Review of Systems  Constitutional:  Negative for appetite change and unexpected weight change.  HENT:  Negative for congestion and sinus pressure.   Respiratory:  Negative for cough, chest tightness and shortness of breath.   Cardiovascular:  Negative for chest pain and palpitations.  Gastrointestinal:  Negative for abdominal pain, diarrhea, nausea and vomiting.  Genitourinary:  Negative for difficulty urinating and dysuria.  Musculoskeletal:  Negative for joint swelling and myalgias.  Skin:  Negative for color change and rash.  Neurological:  Negative for headaches.       Issues with BPPV as outlined.   Psychiatric/Behavioral:  Negative for agitation and dysphoric mood.        Objective:     BP 128/72   Pulse 60   Temp 98.3 F (36.8 C)   Resp 16   Ht 5' (1.524 m)   Wt 118 lb 9.6 oz (53.8 kg)   SpO2 98%   BMI 23.16 kg/m  Wt Readings from Last 3 Encounters:  07/21/23 118 lb 9.6 oz (53.8 kg)  05/17/23 119 lb 6.4 oz (54.2 kg)  01/22/23 126 lb (57.2 kg)    Physical Exam Vitals reviewed.  Constitutional:      General: She is not in acute distress.    Appearance: Normal appearance.  HENT:     Head: Normocephalic and atraumatic.     Right Ear: External ear normal.     Left Ear: External ear normal.  Eyes:     General: No scleral icterus.       Right eye: No discharge.        Left eye: No discharge.     Conjunctiva/sclera: Conjunctivae normal.  Neck:     Thyroid: No thyromegaly.  Cardiovascular:     Rate and Rhythm: Normal rate and regular rhythm.  Pulmonary:     Effort: No respiratory distress.     Breath  sounds: Normal breath sounds. No wheezing.  Abdominal:     General: Bowel sounds are normal.     Palpations: Abdomen is soft.     Tenderness: There is no abdominal tenderness.  Musculoskeletal:        General: No swelling or tenderness.     Cervical back: Neck supple. No tenderness.  Lymphadenopathy:     Cervical: No cervical adenopathy.  Skin:    Findings: No erythema or rash.  Neurological:     Mental Status: She is  alert.  Psychiatric:        Mood and Affect: Mood normal.        Behavior: Behavior normal.      Outpatient Encounter Medications as of 07/21/2023  Medication Sig   aspirin 81 MG tablet Take 81 mg by mouth daily.   atenolol (TENORMIN) 50 MG tablet Take 1 tablet (50 mg total) by mouth daily.   carbidopa-levodopa (SINEMET IR) 25-100 MG tablet Take 1 tablet by mouth 3 (three) times daily.   Cholecalciferol (VITAMIN D3 SUPER STRENGTH) 50 MCG (2000 UT) TABS Take by mouth at bedtime.   Cyanocobalamin (B-12 COMPLIANCE INJECTION) 1000 MCG/ML KIT Inject as directed.   erythromycin ophthalmic ointment Apply to sutures 4 times a day for 10-12 days.  Discontinue if allergy develops and call our office   fexofenadine (ALLEGRA) 180 MG tablet Take 180 mg by mouth as needed.    lactase (LACTAID) 3000 UNITS tablet Take 1 tablet by mouth as needed.   ondansetron (ZOFRAN-ODT) 4 MG disintegrating tablet Take 1 tablet (4 mg total) by mouth every 8 (eight) hours as needed for nausea or vomiting.   rosuvastatin (CRESTOR) 10 MG tablet TAKE 1 TABLET BY MOUTH EVERY DAY   sertraline (ZOLOFT) 50 MG tablet TAKE 1/2 (25MG ) BY MOUTH ONCE DAILY FOR 10 DAYS THEN INCREASE TO 1 DAILY   [DISCONTINUED] busPIRone (BUSPAR) 5 MG tablet Take 1 tablet (5 mg total) by mouth daily as needed.   [DISCONTINUED] pregabalin (LYRICA) 50 MG capsule Take 50 mg by mouth 2 (two) times daily.   [DISCONTINUED] progesterone (PROMETRIUM) 100 MG capsule Take 1 capsule (100 mg total) by mouth daily.   No  facility-administered encounter medications on file as of 07/21/2023.     Lab Results  Component Value Date   WBC 8.3 01/08/2023   HGB 12.0 01/08/2023   HCT 36.3 01/08/2023   PLT 234 01/08/2023   GLUCOSE 103 (H) 05/19/2023   CHOL 114 05/19/2023   TRIG 106.0 05/19/2023   HDL 48.10 05/19/2023   LDLDIRECT 139.4 05/12/2013   LDLCALC 45 05/19/2023   ALT 12 05/19/2023   AST 13 05/19/2023   NA 139 05/19/2023   K 4.2 05/19/2023   CL 105 05/19/2023   CREATININE 1.07 05/19/2023   BUN 14 05/19/2023   CO2 27 05/19/2023   TSH 2.73 01/08/2023   HGBA1C 6.1 05/19/2023   MICROALBUR <0.7 05/19/2023    CT HEAD WO CONTRAST ( )  Result Date: 05/17/2023 CLINICAL DATA:  Larey Seat 3-4 days ago with trauma to the head. Dizziness. Mental status changes. EXAM: CT HEAD WITHOUT CONTRAST TECHNIQUE: Contiguous axial images were obtained from the base of the skull through the vertex without intravenous contrast. RADIATION DOSE REDUCTION: This exam was performed according to the departmental dose-optimization program which includes automated exposure control, adjustment of the mA and/or kV according to patient size and/or use of iterative reconstruction technique. COMPARISON:  03/04/2022 FINDINGS: Brain: Age related volume loss. Mild chronic small-vessel ischemic change of the white matter. No evidence of recent stroke, mass, hemorrhage, hydrocephalus or extra-axial collection. Vascular: There is atherosclerotic calcification of the major vessels at the base of the brain. Skull: Negative.  No fracture. Sinuses/Orbits: Clear/normal Other: None IMPRESSION: No acute or traumatic finding. Age related volume loss. Mild chronic small-vessel ischemic change of the white matter. Electronically Signed   By: Paulina Fusi M.D.   On: 05/17/2023 15:24       Assessment & Plan:  Type 2 diabetes mellitus with hyperglycemia, without long-term current use  of insulin (HCC) Assessment & Plan: Appetite is better.  Follow met b and a1c.    Orders: -     Hemoglobin A1c; Future  Hypercholesterolemia Assessment & Plan: On crestor. Follow lipid panel and liver function tests.   Lab Results  Component Value Date   CHOL 114 05/19/2023   HDL 48.10 05/19/2023   LDLCALC 45 05/19/2023   LDLDIRECT 139.4 05/12/2013   TRIG 106.0 05/19/2023   CHOLHDL 2 05/19/2023    Orders: -     Basic metabolic panel; Future -     Hepatic function panel; Future -     Lipid panel; Future  Need for influenza vaccination -     Flu Vaccine Trivalent High Dose (Fluad)  Vitamin D deficiency Assessment & Plan: Follow vitamin D level.    Vertigo Assessment & Plan: ENT.  BPPV.  Saw neurology.  Recommended PT referral.  F/u with neurology regarding referral.    Stress Assessment & Plan: On zoloft. Stable.    Primary hypertension Assessment & Plan: Blood pressure doing well on atenolol.  Remain off amlodipine.  Continues on atenolol. Follow pressures.  Follow metabolic panel.    Hyperglycemia Assessment & Plan: Low carb diet and exercise.  Follow met b and a1c.    Stage 3a chronic kidney disease (HCC) Assessment & Plan: Avoid antiinflammatories.  Stay hydrated.  Follow metabolic panel.       Dale [place], MD

## 2023-07-25 ENCOUNTER — Other Ambulatory Visit: Payer: Self-pay | Admitting: Internal Medicine

## 2023-07-25 ENCOUNTER — Encounter: Payer: Self-pay | Admitting: Internal Medicine

## 2023-07-25 NOTE — Assessment & Plan Note (Signed)
Appetite is better.  Follow met b and a1c.  

## 2023-07-25 NOTE — Assessment & Plan Note (Signed)
Avoid antiinflammatories.  Stay hydrated.  Follow metabolic panel.   

## 2023-07-25 NOTE — Assessment & Plan Note (Signed)
Low carb diet and exercise.  Follow met b and a1c.  

## 2023-07-25 NOTE — Assessment & Plan Note (Signed)
Blood pressure doing well on atenolol.  Remain off amlodipine.  Continues on atenolol. Follow pressures.  Follow metabolic panel.

## 2023-07-25 NOTE — Assessment & Plan Note (Signed)
On crestor. Follow lipid panel and liver function tests.   Lab Results  Component Value Date   CHOL 114 05/19/2023   HDL 48.10 05/19/2023   LDLCALC 45 05/19/2023   LDLDIRECT 139.4 05/12/2013   TRIG 106.0 05/19/2023   CHOLHDL 2 05/19/2023

## 2023-07-25 NOTE — Assessment & Plan Note (Signed)
On zoloft.  Stable.  

## 2023-07-25 NOTE — Assessment & Plan Note (Signed)
ENT.  BPPV.  Saw neurology.  Recommended PT referral.  F/u with neurology regarding referral.

## 2023-07-25 NOTE — Assessment & Plan Note (Signed)
Follow vitamin D level.  

## 2023-07-28 ENCOUNTER — Telehealth: Payer: Self-pay | Admitting: Internal Medicine

## 2023-07-28 NOTE — Telephone Encounter (Signed)
Copied from CRM [id number]. Topic: Medicare AWV >> Jul 28, 2023  3:07 PM Payton Doughty wrote: Reason for CRM: Called LVM 07/28/19 24 to schedule Annual Wellness Visit  Verlee Rossetti; Care Guide Ambulatory Clinical Support [place] [facility] Direct Dial: [phone]

## 2023-08-04 ENCOUNTER — Ambulatory Visit: Payer: Medicare Other | Admitting: Emergency Medicine

## 2023-08-04 VITALS — Ht 61.0 in | Wt 118.0 lb

## 2023-08-04 DIAGNOSIS — Z1231 Encounter for screening mammogram for malignant neoplasm of breast: Secondary | ICD-10-CM | POA: Diagnosis not present

## 2023-08-04 DIAGNOSIS — Z0000 Encounter for general adult medical examination without abnormal findings: Secondary | ICD-10-CM

## 2023-08-04 DIAGNOSIS — H814 Vertigo of central origin: Secondary | ICD-10-CM | POA: Diagnosis not present

## 2023-08-04 DIAGNOSIS — R262 Difficulty in walking, not elsewhere classified: Secondary | ICD-10-CM | POA: Diagnosis not present

## 2023-08-04 NOTE — Patient Instructions (Addendum)
Kara Dixon , Thank you for taking time to come for your Medicare Wellness Visit. I appreciate your ongoing commitment to your health goals. Please review the following plan we discussed and let me know if I can assist you in the future.   Referrals/Orders/Follow-Ups/Clinician Recommendations: Get a tetanus and covid vaccines at your earliest convenience. I have placed an order for a mammogram. Call [facility] @ [phone] to schedule. Your mammogram is due 11/28/23.  This is a list of the screening recommended for you and due dates:  Health Maintenance  Topic Date Due   DTaP/Tdap/Td vaccine (1 - Tdap) Never done   Complete foot exam   11/21/2021   Eye exam for diabetics  02/11/2022   COVID-19 Vaccine (4 - 2023-24 season) 08/06/2023*   Hemoglobin A1C  11/19/2023   Mammogram  11/27/2023   Yearly kidney function blood test for diabetes  05/18/2024   Yearly kidney health urinalysis for diabetes  05/18/2024   Medicare Annual Wellness Visit  08/03/2024   Pneumonia Vaccine  Completed   Flu Shot  Completed   DEXA scan (bone density measurement)  Completed   Zoster (Shingles) Vaccine  Completed   HPV Vaccine  Aged Out  *Topic was postponed. The date shown is not the original due date.    Advanced directives: (ACP Link)Information on Advanced Care Planning can be found at [facility] of [facility] Advance Health Care Directives Advance Health Care Directives ([email or web address]   Please bring a copy of your health care power of attorney and living will to the office to be added to your chart at your convenience.   Next Medicare Annual Wellness Visit scheduled for next year: Yes, 08/08/24 @ 2:50pm  Fall Prevention in the Home, Adult Falls can cause injuries and affect people of all ages. There are many simple things that you can do to make your home safe and to help prevent falls. If you need it, ask for help making these changes. What actions can I take to prevent falls? General  information Use good lighting in all rooms. Make sure to: Replace any light bulbs that burn out. Turn on lights if it is dark and use night-lights. Keep items that you use often in easy-to-reach places. Lower the shelves around your home if needed. Move furniture so that there are clear paths around it. Do not keep throw rugs or other things on the floor that can make you trip. If any of your floors are uneven, fix them. Add color or contrast paint or tape to clearly mark and help you see: Grab bars or handrails. First and last steps of staircases. Where the edge of each step is. If you use a ladder or stepladder: Make sure that it is fully opened. Do not climb a closed ladder. Make sure the sides of the ladder are locked in place. Have someone hold the ladder while you use it. Know where your pets are as you move through your home. What can I do in the bathroom?     Keep the floor dry. Clean up any water that is on the floor right away. Remove soap buildup in the bathtub or shower. Buildup makes bathtubs and showers slippery. Use non-skid mats or decals on the floor of the bathtub or shower. Attach bath mats securely with double-sided, non-slip rug tape. If you need to sit down while you are in the shower, use a non-slip stool. Install grab bars by the toilet and in the bathtub and shower.  Do not use towel bars as grab bars. What can I do in the bedroom? Make sure that you have a light by your bed that is easy to reach. Do not use any sheets or blankets on your bed that hang to the floor. Have a firm bench or chair with side arms that you can use for support when you get dressed. What can I do in the kitchen? Clean up any spills right away. If you need to reach something above you, use a sturdy step stool that has a grab bar. Keep electrical cables out of the way. Do not use floor polish or wax that makes floors slippery. What can I do with my stairs? Do not leave anything on  the stairs. Make sure that you have a light switch at the top and the bottom of the stairs. Have them installed if you do not have them. Make sure that there are handrails on both sides of the stairs. Fix handrails that are broken or loose. Make sure that handrails are as long as the staircases. Install non-slip stair treads on all stairs in your home if they do not have carpet. Avoid having throw rugs at the top or bottom of stairs, or secure the rugs with carpet tape to prevent them from moving. Choose a carpet design that does not hide the edge of steps on the stairs. Make sure that carpet is firmly attached to the stairs. Fix any carpet that is loose or worn. What can I do on the outside of my home? Use bright outdoor lighting. Repair the edges of walkways and driveways and fix any cracks. Clear paths of anything that can make you trip, such as tools or rocks. Add color or contrast paint or tape to clearly mark and help you see high doorway thresholds. Trim any bushes or trees on the main path into your home. Check that handrails are securely fastened and in good repair. Both sides of all steps should have handrails. Install guardrails along the edges of any raised decks or porches. Have leaves, snow, and ice cleared regularly. Use sand, salt, or ice melt on walkways during winter months if you live where there is ice and snow. In the garage, clean up any spills right away, including grease or oil spills. What other actions can I take? Review your medicines with your health care provider. Some medicines can make you confused or feel dizzy. This can increase your chance of falling. Wear closed-toe shoes that fit well and support your feet. Wear shoes that have rubber soles and low heels. Use a cane, walker, scooter, or crutches that help you move around if needed. Talk with your provider about other ways that you can decrease your risk of falls. This may include seeing a physical therapist to  learn to do exercises to improve movement and strength. Where to find more information Centers for Disease Control and Prevention, [employer]AD[employer]: TonerPromos.no [employer] on Aging: BaseRingTones.pl National Institute on Aging: BaseRingTones.pl Contact a health care provider if: You are afraid of falling at home. You feel weak, drowsy, or dizzy at home. You fall at home. Get help right away if you: Lose consciousness or have trouble moving after a fall. Have a fall that causes a head injury. These symptoms may be an emergency. Get help right away. Call 911. Do not wait to see if the symptoms will go away. Do not drive yourself to the hospital. This information is not intended to replace advice given to  you by your health care provider. Make sure you discuss any questions you have with your health care provider. Document Revised: 05/25/2022 Document Reviewed: 05/25/2022 Elsevier Patient Education  2024 [employer].

## 2023-08-04 NOTE — Progress Notes (Signed)
Subjective:   Kara Dixon is a 87 y.o. female who presents for Medicare Annual (Subsequent) preventive examination.  Visit Complete: Virtual I connected with  Kara Dixon on 08/04/23 by a audio enabled telemedicine application and verified that I am speaking with the correct person using two identifiers.  Patient Location: Home  Provider Location: Home Office  I discussed the limitations of evaluation and management by telemedicine. The patient expressed understanding and agreed to proceed.  Vital Signs: Because this visit was a virtual/telehealth visit, some criteria may be missing or patient reported. Any vitals not documented were not able to be obtained and vitals that have been documented are patient reported.  Patient Medicare AWV questionnaire was completed by the patient on 07/31/23; I have confirmed that all information answered by patient is correct and no changes since this date.  Cardiac Risk Factors include: advanced age (>32men, >62 women);diabetes mellitus;dyslipidemia;hypertension     Objective:    Today's Vitals   08/04/23 1458  Weight: 118 lb (53.5 kg)  Height: 5\' 1"  (1.549 m)   Body mass index is 22.3 kg/m.     08/04/2023    3:15 PM 07/27/2022    2:09 PM 05/12/2021    1:54 PM 02/25/2021    7:40 AM 02/11/2021   10:25 AM 09/23/2018    9:57 AM 09/23/2017    9:11 AM  Advanced Directives  Does Patient Have a Medical Advance Directive? Yes No No No No No No  Type of Estate agent of [place];Living will        Does patient want to make changes to medical advance directive? No - Patient declined        Copy of Healthcare Power of Attorney in Chart? No - copy requested        Would patient like information on creating a medical advance directive?  No - Patient declined No - Patient declined No - Patient declined Yes (MAU/Ambulatory/Procedural Areas - Information given) No - Patient declined Yes (MAU/Ambulatory/Procedural Areas -  Information given)    Current Medications (verified) Outpatient Encounter Medications as of 08/04/2023  Medication Sig   aspirin 81 MG tablet Take 81 mg by mouth daily.   atenolol (TENORMIN) 50 MG tablet Take 1 tablet (50 mg total) by mouth daily.   carbidopa-levodopa (SINEMET IR) 25-100 MG tablet Take 1 tablet by mouth 3 (three) times daily.   Cholecalciferol (VITAMIN D3 SUPER STRENGTH) 50 MCG (2000 UT) TABS Take by mouth at bedtime.   Cyanocobalamin (B-12 COMPLIANCE INJECTION) 1000 MCG/ML KIT Inject as directed.   lactase (LACTAID) 3000 UNITS tablet Take 1 tablet by mouth as needed.   meclizine (ANTIVERT) 25 MG tablet Take 25 mg by mouth 2 (two) times daily as needed.   ondansetron (ZOFRAN-ODT) 4 MG disintegrating tablet Take 1 tablet (4 mg total) by mouth every 8 (eight) hours as needed for nausea or vomiting.   rosuvastatin (CRESTOR) 10 MG tablet TAKE 1 TABLET BY MOUTH EVERY DAY   sertraline (ZOLOFT) 50 MG tablet TAKE 1/2 (25MG ) BY MOUTH ONCE DAILY FOR 10 DAYS THEN INCREASE TO 1 DAILY   fexofenadine (ALLEGRA) 180 MG tablet Take 180 mg by mouth as needed.  (Patient not taking: Reported on 08/04/2023)   [DISCONTINUED] erythromycin ophthalmic ointment Apply to sutures 4 times a day for 10-12 days.  Discontinue if allergy develops and call our office   No facility-administered encounter medications on file as of 08/04/2023.    Allergies (verified) Avelox [moxifloxacin hcl in nacl], Mucinex [  guaifenesin er], Penicillins, Sulfa antibiotics, Lotemax [loteprednol], and Maxitrol [neomycin-polymyxin-dexameth]   History: Past Medical History:  Diagnosis Date   Cancer (HCC)    skin   CKD (chronic kidney disease), stage III (HCC)    Hypercholesterolemia    Hypertension    Nephrolithiasis    Osteopenia    Parkinson's disease (HCC)    Type 2 diabetes mellitus (HCC)    Vertigo    Vitamin D deficiency    Past Surgical History:  Procedure Laterality Date   BREAST BIOPSY Left 12/10/2022    LEFT stereo bx, calcs, "COIL" clip-path pending   BREAST BIOPSY Left 12/10/2022   MM LT BREAST BX W LOC DEV 1ST LESION IMAGE BX SPEC STEREO GUIDE 12/10/2022 [facility]   CATARACT EXTRACTION W/PHACO Right 02/11/2021   Procedure: CATARACT EXTRACTION PHACO AND INTRAOCULAR LENS PLACEMENT (IOC) RIGHT;  Surgeon: Galen Manila, MD;  Location: [facility];  Service: Ophthalmology;  Laterality: Right;  CDE15.19 01:16.1 minutes   CATARACT EXTRACTION W/PHACO Left 02/25/2021   Procedure: CATARACT EXTRACTION PHACO AND INTRAOCULAR LENS PLACEMENT (IOC) LEFT;  Surgeon: Galen Manila, MD;  Location: [facility];  Service: Ophthalmology;  Laterality: Left;  18.25 01:23.4   cyst removed under tongue  age 77   ECTROPION REPAIR Right 01/22/2023   Procedure: ECTROPION REPAIR, TARSAL WEDGE ECTROPION REPAIR, EXTENSIVE RIGHT LOWER LID;  Surgeon: Imagene Riches, MD;  Location: [facility];  Service: Ophthalmology;  Laterality: Right;   EYE SURGERY  02/2017   EYE LID; care everywhere   Family History  Problem Relation Age of Onset   Stroke Mother    Hypertension Mother    Other Father 71   Fibromyalgia Sister    Breast cancer Neg Hx    Colon cancer Neg Hx    Social History   Socioeconomic History   Marital status: Widowed    Spouse name: Not on file   Number of children: 1   Years of education: Not on file   Highest education level: Associate degree: occupational, Scientist, product/process development, or vocational program  Occupational History   Occupation: retired  Tobacco Use   Smoking status: Never   Smokeless tobacco: Never  Vaping Use   Vaping status: Never Used  Substance and Sexual Activity   Alcohol use: No    Alcohol/week: 0.0 standard drinks of alcohol   Drug use: No   Sexual activity: Not Currently  Other Topics Concern   Not on file  Social History Narrative   Husband deceased 2022-11-11   Agustin Cree, neighbor helps patient with daily activities and transportation   Social  Determinants of Health   Financial Resource Strain: Low Risk  (07/31/2023)   Overall Financial Resource Strain (CARDIA)    Difficulty of Paying Living Expenses: Not hard at all  Food Insecurity: No Food Insecurity (07/31/2023)   Hunger Vital Sign    Worried About Running Out of Food in the Last Year: Never true    Ran Out of Food in the Last Year: Never true  Transportation Needs: No Transportation Needs (07/31/2023)   PRAPARE - Administrator, Civil Service (Medical): No    Lack of Transportation (Non-Medical): No  Physical Activity: Insufficiently Active (07/31/2023)   Exercise Vital Sign    Days of Exercise per Week: 3 days    Minutes of Exercise per Session: 10 min  Stress: No Stress Concern Present (07/31/2023)   [employer] of Occupational Health - Occupational Stress Questionnaire    Feeling of Stress : Not at  all  Social Connections: Socially Isolated (07/31/2023)   Social Connection and Isolation Panel [NHANES]    Frequency of Communication with Friends and Family: More than three times a week    Frequency of Social Gatherings with Friends and Family: More than three times a week    Attends Religious Services: Never    Database administrator or Organizations: No    Attends Banker Meetings: Never    Marital Status: Widowed    Tobacco Counseling Counseling given: Not Answered   Clinical Intake:  Pre-visit preparation completed: Yes  Pain : No/denies pain     BMI - recorded: 22.3 Nutritional Status: BMI of 19-24  Normal Nutritional Risks: None Diabetes: Yes CBG done?: No Did pt. bring in CBG monitor from home?: No  How often do you need to have someone help you when you read instructions, pamphlets, or other written materials from your doctor or pharmacy?: 1 - Never  Interpreter Needed?: No  Information entered by :: Tora Kindred, CMA   Activities of Daily Living    07/31/2023   10:21 AM 01/22/2023   10:45 AM  In your  present state of health, do you have any difficulty performing the following activities:  Hearing? 0 0  Vision? 0 0  Difficulty concentrating or making decisions? 0 0  Walking or climbing stairs? 0 1  Dressing or bathing? 1 0  Comment needs assistance   Doing errands, shopping? 1   Comment Darlene, neighbor takes to appts and Medical illustrator and eating ? N   Using the Toilet? N   In the past six months, have you accidently leaked urine? N   Do you have problems with loss of bowel control? N   Managing your Medications? Y   Comment Darlene, neighbor fills pill box   Managing your Finances? N   Housekeeping or managing your Housekeeping? Y   Comment needs help with house cleaning     Patient Care Team: Dale [place], MD as PCP - General (Internal Medicine)  Indicate any recent Medical Services you may have received from other than [facility] providers in the past year (date may be approximate).     Assessment:   This is a routine wellness examination for Robertine.  Hearing/Vision screen Hearing Screening - Comments:: Slight hearing loss per patient Vision Screening - Comments:: Gets routine eye exam   Goals Addressed               This Visit's Progress     Patient Stated (pt-stated)        Maintain current health      Depression Screen    08/04/2023    3:11 PM 07/27/2022    2:06 PM 03/11/2022   10:04 AM 12/29/2021    7:27 AM 05/12/2021    1:49 PM 04/03/2021   11:25 AM 08/20/2020    2:08 PM  PHQ 2/9 Scores  PHQ - 2 Score 0 0 0 5 0 0 0  PHQ- 9 Score 0   10       Fall Risk    07/31/2023   10:21 AM 07/27/2022    1:57 PM 03/11/2022   10:03 AM 12/29/2021    7:28 AM 05/12/2021    1:48 PM  Fall Risk   Falls in the past year? 1 1 1 1  0  Number falls in past yr: 0 0 1 1   Injury with Fall? 1 0 1 0   Risk for fall due  to : History of fall(s);Impaired balance/gait;Orthopedic patient;Impaired mobility -- History of fall(s);Impaired balance/gait;Impaired vision Mental  status change;Impaired vision;Impaired balance/gait   Risk for fall due to: Comment  Hx of vertigo     Follow up Education provided;Falls prevention discussed;Falls evaluation completed Falls evaluation completed Falls evaluation completed Falls prevention discussed Falls evaluation completed    MEDICARE RISK AT HOME: Medicare Risk at Home Any stairs in or around the home?: Yes If so, are there any without handrails?: No Home free of loose throw rugs in walkways, pet beds, electrical cords, etc?: Yes Adequate lighting in your home to reduce risk of falls?: Yes Life alert?: No Use of a cane, walker or w/c?: Yes Grab bars in the bathroom?: Yes Shower chair or bench in shower?: Yes Elevated toilet seat or a handicapped toilet?: Yes  TIMED UP AND GO:  Was the test performed?  No    Cognitive Function:        08/04/2023    3:19 PM 07/27/2022    2:27 PM 05/12/2021    1:51 PM 09/23/2018   10:00 AM 09/23/2017    9:25 AM  6CIT Screen  What Year? 0 points 0 points 0 points 0 points 0 points  What month? 0 points 0 points 0 points 0 points 0 points  What time? 0 points 0 points 0 points 0 points 0 points  Count back from 20 2 points 2 points 0 points 0 points 0 points  Months in reverse 4 points 4 points 0 points 0 points 0 points  Repeat phrase 2 points   0 points 0 points  Total Score 8 points   0 points 0 points    Immunizations Immunization History  Administered Date(s) Administered   Fluad Quad(high Dose 65+) 09/11/2019, 08/20/2020, 10/23/2021   Fluad Trivalent(High Dose 65+) 07/21/2023   Influenza Split 07/10/2013   Influenza, High Dose Seasonal PF 10/30/2016, 09/10/2017, 09/06/2018, 08/05/2022   Influenza,inj,Quad PF,6+ Mos 09/10/2014, 09/18/2015   Influenza-Unspecified 07/25/2012, 07/11/2013   Moderna SARS-COV2 Booster Vaccination 08/05/2022   Moderna Sars-Covid-2 Vaccination 04/25/2020, 05/23/2020, 11/27/2020   Pfizer(Comirnaty)Fall Seasonal Vaccine 12 years and older  08/17/2022   Pneumococcal Conjugate-13 03/09/2017   Pneumococcal Polysaccharide-23 05/18/2018   Zoster Recombinant(Shingrix) 07/05/2022, 11/23/2022    TDAP status: Due, Education has been provided regarding the importance of this vaccine. Advised may receive this vaccine at local pharmacy or Health Dept. Aware to provide a copy of the vaccination record if obtained from local pharmacy or Health Dept. Verbalized acceptance and understanding.  Flu Vaccine status: Up to date  Pneumococcal vaccine status: Up to date  Covid-19 vaccine status: Information provided on how to obtain vaccines.   Qualifies for Shingles Vaccine? Yes   Zostavax completed No   Shingrix Completed?: Yes  Screening Tests Health Maintenance  Topic Date Due   DTaP/Tdap/Td (1 - Tdap) Never done   FOOT EXAM  11/21/2021   OPHTHALMOLOGY EXAM  02/11/2022   COVID-19 Vaccine (4 - 2023-24 season) 08/06/2023 (Originally 06/06/2023)   HEMOGLOBIN A1C  11/19/2023   MAMMOGRAM  11/27/2023   Diabetic kidney evaluation - eGFR measurement  05/18/2024   Diabetic kidney evaluation - Urine ACR  05/18/2024   Medicare Annual Wellness (AWV)  08/03/2024   Pneumonia Vaccine 59+ Years old  Completed   INFLUENZA VACCINE  Completed   DEXA SCAN  Completed   Zoster Vaccines- Shingrix  Completed   HPV VACCINES  Aged Out    Health Maintenance  Health Maintenance Due  Topic Date Due  DTaP/Tdap/Td (1 - Tdap) Never done   FOOT EXAM  11/21/2021   OPHTHALMOLOGY EXAM  02/11/2022    Colorectal cancer screening: No longer required.   Mammogram status: Completed 11/26/22. Repeat every year  Bone Density status: Completed 12/17/20. Results reflect: Bone density results: OSTEOPENIA. Repeat every 2 years. Patient declined.  Lung Cancer Screening: (Low Dose CT Chest recommended if Age 29-80 years, 20 pack-year currently smoking OR have quit w/in 15years.) does not qualify.   Lung Cancer Screening Referral: n/a  Additional  Screening:  Hepatitis C Screening: does not qualify;   Vision Screening: Recommended annual ophthalmology exams for early detection of glaucoma and other disorders of the eye. Is the patient up to date with their annual eye exam?  Yes , requested copies of last eye exam Who is the provider or what is the name of the office in which the patient attends annual eye exams? Dr. Galen Manila If pt is not established with a provider, would they like to be referred to a provider to establish care? No .   Dental Screening: Recommended annual dental exams for proper oral hygiene  Diabetic Foot Exam: Diabetic Foot Exam: Overdue, Pt has been advised about the importance in completing this exam. Pt is scheduled for diabetic foot exam on 11/22/23. To be done at next OV with Dr. Delorse Limber Resource Referral / Chronic Care Management: CRR required this visit?  No   CCM required this visit?  No     Plan:     I have personally reviewed and noted the following in the patient's chart:   Medical and social history Use of alcohol, tobacco or illicit drugs  Current medications and supplements including opioid prescriptions. Patient is not currently taking opioid prescriptions. Functional ability and status Nutritional status Physical activity Advanced directives List of other physicians Hospitalizations, surgeries, and ER visits in previous 12 months Vitals Screenings to include cognitive, depression, and falls Referrals and appointments  In addition, I have reviewed and discussed with patient certain preventive protocols, quality metrics, and best practice recommendations. A written personalized care plan for preventive services as well as general preventive health recommendations were provided to patient.     Tora Kindred, CMA   08/04/2023   After Visit Summary: (MyChart) Due to this being a telephonic visit, the after visit summary with patients personalized plan was offered to  patient via MyChart   Nurse Notes:  Patient's neighbor, Agustin Cree, helped with today's visit 6 CIT Score - 8 Patient declined DM & Nutrition education Patient denies being diabetic Needs Tdap and Covid vaccines Requested most recent eye exam from Dr. Druscilla Brownie Declined DEXA scan Placed order for MMG due ~11/28/23 Needs DM foot exam at next OV on 11/22/23.

## 2023-09-06 DIAGNOSIS — R262 Difficulty in walking, not elsewhere classified: Secondary | ICD-10-CM | POA: Diagnosis not present

## 2023-09-06 DIAGNOSIS — H814 Vertigo of central origin: Secondary | ICD-10-CM | POA: Diagnosis not present

## 2023-09-08 ENCOUNTER — Other Ambulatory Visit: Payer: Self-pay | Admitting: Internal Medicine

## 2023-09-08 DIAGNOSIS — R42 Dizziness and giddiness: Secondary | ICD-10-CM

## 2023-09-20 ENCOUNTER — Other Ambulatory Visit: Payer: Medicare Other

## 2023-09-20 DIAGNOSIS — H814 Vertigo of central origin: Secondary | ICD-10-CM | POA: Diagnosis not present

## 2023-09-20 DIAGNOSIS — R262 Difficulty in walking, not elsewhere classified: Secondary | ICD-10-CM | POA: Diagnosis not present

## 2023-09-23 ENCOUNTER — Other Ambulatory Visit (INDEPENDENT_AMBULATORY_CARE_PROVIDER_SITE_OTHER): Payer: Medicare Other

## 2023-09-23 DIAGNOSIS — E1165 Type 2 diabetes mellitus with hyperglycemia: Secondary | ICD-10-CM

## 2023-09-23 DIAGNOSIS — E7800 Pure hypercholesterolemia, unspecified: Secondary | ICD-10-CM

## 2023-09-23 LAB — BASIC METABOLIC PANEL
BUN: 13 mg/dL (ref 6–23)
CO2: 29 meq/L (ref 19–32)
Calcium: 8.9 mg/dL (ref 8.4–10.5)
Chloride: 107 meq/L (ref 96–112)
Creatinine, Ser: 0.98 mg/dL (ref 0.40–1.20)
GFR: 52.67 mL/min — ABNORMAL LOW (ref >60.00)
Glucose, Bld: 100 mg/dL — ABNORMAL HIGH (ref 70–99)
Potassium: 3.9 meq/L (ref 3.5–5.1)
Sodium: 142 meq/L (ref 135–145)

## 2023-09-23 LAB — HEPATIC FUNCTION PANEL
ALT: 5 U/L (ref 0–35)
AST: 10 U/L (ref 0–37)
Albumin: 4 g/dL (ref 3.5–5.2)
Alkaline Phosphatase: 64 U/L (ref 39–117)
Bilirubin, Direct: 0.1 mg/dL (ref 0.0–0.3)
Total Bilirubin: 0.6 mg/dL (ref 0.2–1.2)
Total Protein: 6.8 g/dL (ref 6.0–8.3)

## 2023-09-23 LAB — LIPID PANEL
Cholesterol: 106 mg/dL (ref 0–200)
HDL: 48.2 mg/dL (ref >39.00)
LDL Cholesterol: 44 mg/dL (ref 0–99)
NonHDL: 57.84
Total CHOL/HDL Ratio: 2
Triglycerides: 67 mg/dL (ref 0.0–149.0)
VLDL: 13.4 mg/dL (ref 0.0–40.0)

## 2023-09-23 LAB — HEMOGLOBIN A1C: Hgb A1c MFr Bld: 6 % (ref 4.6–6.5)

## 2023-09-27 DIAGNOSIS — H814 Vertigo of central origin: Secondary | ICD-10-CM | POA: Diagnosis not present

## 2023-09-27 DIAGNOSIS — R262 Difficulty in walking, not elsewhere classified: Secondary | ICD-10-CM | POA: Diagnosis not present

## 2023-10-07 DIAGNOSIS — H814 Vertigo of central origin: Secondary | ICD-10-CM | POA: Diagnosis not present

## 2023-10-07 DIAGNOSIS — R262 Difficulty in walking, not elsewhere classified: Secondary | ICD-10-CM | POA: Diagnosis not present

## 2023-10-25 DIAGNOSIS — R262 Difficulty in walking, not elsewhere classified: Secondary | ICD-10-CM | POA: Diagnosis not present

## 2023-10-25 DIAGNOSIS — H814 Vertigo of central origin: Secondary | ICD-10-CM | POA: Diagnosis not present

## 2023-11-22 ENCOUNTER — Ambulatory Visit: Payer: Medicare Other | Admitting: Internal Medicine

## 2023-11-26 ENCOUNTER — Ambulatory Visit: Payer: Medicare Other | Admitting: Internal Medicine

## 2023-12-06 ENCOUNTER — Encounter: Payer: Self-pay | Admitting: Internal Medicine

## 2023-12-06 ENCOUNTER — Ambulatory Visit (INDEPENDENT_AMBULATORY_CARE_PROVIDER_SITE_OTHER): Payer: Medicare Other | Admitting: Internal Medicine

## 2023-12-06 VITALS — BP 122/68 | HR 60 | Temp 98.0°F | Resp 16 | Ht 61.0 in | Wt 109.2 lb

## 2023-12-06 DIAGNOSIS — R634 Abnormal weight loss: Secondary | ICD-10-CM

## 2023-12-06 DIAGNOSIS — I10 Essential (primary) hypertension: Secondary | ICD-10-CM | POA: Diagnosis not present

## 2023-12-06 DIAGNOSIS — E1165 Type 2 diabetes mellitus with hyperglycemia: Secondary | ICD-10-CM

## 2023-12-06 DIAGNOSIS — N1831 Chronic kidney disease, stage 3a: Secondary | ICD-10-CM

## 2023-12-06 DIAGNOSIS — E7800 Pure hypercholesterolemia, unspecified: Secondary | ICD-10-CM

## 2023-12-06 DIAGNOSIS — E559 Vitamin D deficiency, unspecified: Secondary | ICD-10-CM | POA: Diagnosis not present

## 2023-12-06 LAB — HM DIABETES FOOT EXAM

## 2023-12-06 NOTE — Assessment & Plan Note (Signed)
 On crestor. Follow lipid panel and liver function tests.  Lab Results  Component Value Date   CHOL 106 09/23/2023   HDL 48.20 09/23/2023   LDLCALC 44 09/23/2023   LDLDIRECT 139.4 05/12/2013   TRIG 67.0 09/23/2023   CHOLHDL 2 09/23/2023

## 2023-12-06 NOTE — Assessment & Plan Note (Signed)
Blood pressure doing well.  Follow pressures.  Follow metabolic panel.  

## 2023-12-06 NOTE — Progress Notes (Signed)
 Subjective:    Patient ID: Kara Dixon, female    DOB: 05/28/1936, 88 y.o.   MRN: [id number]  Patient here for  Chief Complaint  Patient presents with   Medical Management of Chronic Issues    HPI Here for a scheduled follow up -  follow up regarding hypercholesterolemia, diabetes and hypertension.  On atenolol for blood pressure. Remains on zoloft. She is accompanied by her neighbor.  History obtained from both of them. Saw neurology 06/24/23 - f/u parkinsons. Recommended continuing sinemet.  Reports able to do her ADLs. Discussed her weight loss. Discussed the need to eat regular meals. Discussed adding nutritional supplements. No chest pain. Breathing stable. Bowles stable.    Past Medical History:  Diagnosis Date   Cancer (HCC)    skin   CKD (chronic kidney disease), stage III (HCC)    Hypercholesterolemia    Hypertension    Nephrolithiasis    Osteopenia    Parkinson's disease (HCC)    Type 2 diabetes mellitus (HCC)    Vertigo    Vitamin D deficiency    Past Surgical History:  Procedure Laterality Date   BREAST BIOPSY Left 12/10/2022   LEFT stereo bx, calcs, "COIL" clip-path pending   BREAST BIOPSY Left 12/10/2022   MM LT BREAST BX W LOC DEV 1ST LESION IMAGE BX SPEC STEREO GUIDE 12/10/2022 [facility]   CATARACT EXTRACTION W/PHACO Right 02/11/2021   Procedure: CATARACT EXTRACTION PHACO AND INTRAOCULAR LENS PLACEMENT (IOC) RIGHT;  Surgeon: Galen Manila, MD;  Location: [facility];  Service: Ophthalmology;  Laterality: Right;  CDE15.19 01:16.1 minutes   CATARACT EXTRACTION W/PHACO Left 02/25/2021   Procedure: CATARACT EXTRACTION PHACO AND INTRAOCULAR LENS PLACEMENT (IOC) LEFT;  Surgeon: Galen Manila, MD;  Location: [facility];  Service: Ophthalmology;  Laterality: Left;  18.25 01:23.4   cyst removed under tongue  age 46   ECTROPION REPAIR Right 01/22/2023   Procedure: ECTROPION REPAIR, TARSAL WEDGE ECTROPION REPAIR, EXTENSIVE RIGHT LOWER LID;   Surgeon: Imagene Riches, MD;  Location: [facility];  Service: Ophthalmology;  Laterality: Right;   EYE SURGERY  02/2017   EYE LID; care everywhere   Family History  Problem Relation Age of Onset   Stroke Mother    Hypertension Mother    Other Father 28   Fibromyalgia Sister    Breast cancer Neg Hx    Colon cancer Neg Hx    Social History   Socioeconomic History   Marital status: Widowed    Spouse name: Not on file   Number of children: 1   Years of education: Not on file   Highest education level: Associate degree: occupational, Scientist, product/process development, or vocational program  Occupational History   Occupation: retired  Tobacco Use   Smoking status: Never   Smokeless tobacco: Never  Vaping Use   Vaping status: Never Used  Substance and Sexual Activity   Alcohol use: No    Alcohol/week: 0.0 standard drinks of alcohol   Drug use: No   Sexual activity: Not Currently  Other Topics Concern   Not on file  Social History Narrative   Husband deceased 18-Oct-2022   Agustin Cree, neighbor helps patient with daily activities and transportation   Social Drivers of Health   Financial Resource Strain: Low Risk  (07/31/2023)   Overall Financial Resource Strain (CARDIA)    Difficulty of Paying Living Expenses: Not hard at all  Food Insecurity: No Food Insecurity (07/31/2023)   Hunger Vital Sign    Worried About Running Out  of Food in the Last Year: Never true    Ran Out of Food in the Last Year: Never true  Transportation Needs: No Transportation Needs (07/31/2023)   PRAPARE - Administrator, Civil Service (Medical): No    Lack of Transportation (Non-Medical): No  Physical Activity: Insufficiently Active (07/31/2023)   Exercise Vital Sign    Days of Exercise per Week: 3 days    Minutes of Exercise per Session: 10 min  Stress: No Stress Concern Present (07/31/2023)   [employer] of Occupational Health - Occupational Stress Questionnaire    Feeling of Stress : Not at all   Social Connections: Socially Isolated (07/31/2023)   Social Connection and Isolation Panel [NHANES]    Frequency of Communication with Friends and Family: More than three times a week    Frequency of Social Gatherings with Friends and Family: More than three times a week    Attends Religious Services: Never    Database administrator or Organizations: No    Attends Banker Meetings: Never    Marital Status: Widowed     Review of Systems  Constitutional:        Weight loss. Discussed appetite. She reports she is eating.   HENT:  Negative for congestion and sinus pressure.   Respiratory:  Negative for cough, chest tightness and shortness of breath.   Cardiovascular:  Negative for chest pain, palpitations and leg swelling.  Gastrointestinal:  Negative for abdominal pain, diarrhea, nausea and vomiting.  Genitourinary:  Negative for difficulty urinating and dysuria.  Musculoskeletal:  Negative for joint swelling and myalgias.  Skin:  Negative for color change and rash.  Neurological:  Negative for dizziness and headaches.  Psychiatric/Behavioral:  Negative for agitation and dysphoric mood.        Objective:     BP 122/68   Pulse 60   Temp 98 F (36.7 C)   Resp 16   Ht 5\' 1"  (1.549 m)   Wt 109 lb 3.2 oz (49.5 kg)   SpO2 98%   BMI 20.63 kg/m  Wt Readings from Last 3 Encounters:  12/06/23 109 lb 3.2 oz (49.5 kg)  08/04/23 118 lb (53.5 kg)  07/21/23 118 lb 9.6 oz (53.8 kg)    Physical Exam Vitals reviewed.  Constitutional:      General: She is not in acute distress.    Appearance: Normal appearance.  HENT:     Head: Normocephalic and atraumatic.     Right Ear: External ear normal.     Left Ear: External ear normal.     Mouth/Throat:     Pharynx: No oropharyngeal exudate or posterior oropharyngeal erythema.  Eyes:     General: No scleral icterus.       Right eye: No discharge.        Left eye: No discharge.     Conjunctiva/sclera: Conjunctivae normal.   Neck:     Thyroid: No thyromegaly.  Cardiovascular:     Rate and Rhythm: Normal rate and regular rhythm.  Pulmonary:     Effort: No respiratory distress.     Breath sounds: Normal breath sounds. No wheezing.  Abdominal:     General: Bowel sounds are normal.     Palpations: Abdomen is soft.     Tenderness: There is no abdominal tenderness.  Musculoskeletal:        General: No swelling or tenderness.     Cervical back: Neck supple. No tenderness.  Lymphadenopathy:  Cervical: No cervical adenopathy.  Skin:    Findings: No erythema or rash.  Neurological:     Mental Status: She is alert.  Psychiatric:        Mood and Affect: Mood normal.        Behavior: Behavior normal.         Outpatient Encounter Medications as of 12/06/2023  Medication Sig   aspirin 81 MG tablet Take 81 mg by mouth daily.   atenolol (TENORMIN) 50 MG tablet TAKE 1 TABLET BY MOUTH EVERY DAY   carbidopa-levodopa (SINEMET IR) 25-100 MG tablet Take 1 tablet by mouth 3 (three) times daily.   Cholecalciferol (VITAMIN D3 SUPER STRENGTH) 50 MCG (2000 UT) TABS Take by mouth at bedtime.   Cyanocobalamin (B-12 COMPLIANCE INJECTION) 1000 MCG/ML KIT Inject as directed.   lactase (LACTAID) 3000 UNITS tablet Take 1 tablet by mouth as needed.   meclizine (ANTIVERT) 25 MG tablet Take 25 mg by mouth 2 (two) times daily as needed.   ondansetron (ZOFRAN-ODT) 4 MG disintegrating tablet Take 1 tablet (4 mg total) by mouth every 8 (eight) hours as needed for nausea or vomiting.   rosuvastatin (CRESTOR) 10 MG tablet TAKE 1 TABLET BY MOUTH EVERY DAY   sertraline (ZOLOFT) 50 MG tablet TAKE 1/2 (25MG ) BY MOUTH ONCE DAILY FOR 10 DAYS THEN INCREASE TO 1 DAILY (Patient taking differently: Take 50 mg by mouth daily.)   [DISCONTINUED] fexofenadine (ALLEGRA) 180 MG tablet Take 180 mg by mouth as needed.  (Patient not taking: Reported on 08/04/2023)   No facility-administered encounter medications on file as of 12/06/2023.     Lab  Results  Component Value Date   WBC 8.3 01/08/2023   HGB 12.0 01/08/2023   HCT 36.3 01/08/2023   PLT 234 01/08/2023   GLUCOSE 100 (H) 09/23/2023   CHOL 106 09/23/2023   TRIG 67.0 09/23/2023   HDL 48.20 09/23/2023   LDLDIRECT 139.4 05/12/2013   LDLCALC 44 09/23/2023   ALT 5 09/23/2023   AST 10 09/23/2023   NA 142 09/23/2023   K 3.9 09/23/2023   CL 107 09/23/2023   CREATININE 0.98 09/23/2023   BUN 13 09/23/2023   CO2 29 09/23/2023   TSH 2.73 01/08/2023   HGBA1C 6.0 09/23/2023   MICROALBUR <0.7 05/19/2023    CT HEAD WO CONTRAST ( ) Result Date: 05/17/2023 CLINICAL DATA:  Larey Seat 3-4 days ago with trauma to the head. Dizziness. Mental status changes. EXAM: CT HEAD WITHOUT CONTRAST TECHNIQUE: Contiguous axial images were obtained from the base of the skull through the vertex without intravenous contrast. RADIATION DOSE REDUCTION: This exam was performed according to the departmental dose-optimization program which includes automated exposure control, adjustment of the mA and/or kV according to patient size and/or use of iterative reconstruction technique. COMPARISON:  03/04/2022 FINDINGS: Brain: Age related volume loss. Mild chronic small-vessel ischemic change of the white matter. No evidence of recent stroke, mass, hemorrhage, hydrocephalus or extra-axial collection. Vascular: There is atherosclerotic calcification of the major vessels at the base of the brain. Skull: Negative.  No fracture. Sinuses/Orbits: Clear/normal Other: None IMPRESSION: No acute or traumatic finding. Age related volume loss. Mild chronic small-vessel ischemic change of the white matter. Electronically Signed   By: Paulina Fusi M.D.   On: 05/17/2023 15:24       Assessment & Plan:  Weight loss Assessment & Plan: Discussed weight loss. She is eating. Gets Meals on Wheels. Sometimes sleeps late - make skip meal. Discussed adding nutritional supplements. Check routine labs.  Follow weight.    Vitamin D  deficiency Assessment & Plan: Check vitamin D level with next labs.    Type 2 diabetes mellitus with hyperglycemia, without long-term current use of insulin (HCC) Assessment & Plan: Follow met b and A1c.    Primary hypertension Assessment & Plan: Blood pressure doing well. Follow pressures. Follow metabolic panel.    Hypercholesterolemia Assessment & Plan: On crestor. Follow lipid panel and liver function tests.  Lab Results  Component Value Date   CHOL 106 09/23/2023   HDL 48.20 09/23/2023   LDLCALC 44 09/23/2023   LDLDIRECT 139.4 05/12/2013   TRIG 67.0 09/23/2023   CHOLHDL 2 09/23/2023      Stage 3a chronic kidney disease (HCC) Assessment & Plan: Avoid antiinflammatory medication. Stay hydrated. Follow metabolic panel.       Dale Knightsen, MD

## 2023-12-06 NOTE — Assessment & Plan Note (Signed)
Avoid antiinflammatory medication.  Stay hydrated.  Follow metabolic panel.

## 2023-12-06 NOTE — Assessment & Plan Note (Signed)
 Follow met b and A1c.

## 2023-12-06 NOTE — Assessment & Plan Note (Signed)
 Check vitamin D level with next labs.  ?

## 2023-12-06 NOTE — Assessment & Plan Note (Signed)
 Discussed weight loss. She is eating. Gets Meals on Wheels. Sometimes sleeps late - make skip meal. Discussed adding nutritional supplements. Check routine labs. Follow weight.

## 2023-12-09 DIAGNOSIS — D2261 Melanocytic nevi of right upper limb, including shoulder: Secondary | ICD-10-CM | POA: Diagnosis not present

## 2023-12-09 DIAGNOSIS — Z85828 Personal history of other malignant neoplasm of skin: Secondary | ICD-10-CM | POA: Diagnosis not present

## 2023-12-09 DIAGNOSIS — D2262 Melanocytic nevi of left upper limb, including shoulder: Secondary | ICD-10-CM | POA: Diagnosis not present

## 2023-12-09 DIAGNOSIS — D225 Melanocytic nevi of trunk: Secondary | ICD-10-CM | POA: Diagnosis not present

## 2023-12-09 DIAGNOSIS — L570 Actinic keratosis: Secondary | ICD-10-CM | POA: Diagnosis not present

## 2023-12-09 DIAGNOSIS — L578 Other skin changes due to chronic exposure to nonionizing radiation: Secondary | ICD-10-CM | POA: Diagnosis not present

## 2023-12-16 ENCOUNTER — Ambulatory Visit
Admission: RE | Admit: 2023-12-16 | Discharge: 2023-12-16 | Disposition: A | Discharge status: Home / Self Care | Source: Ambulatory Visit | Attending: Internal Medicine | Admitting: Internal Medicine

## 2023-12-16 DIAGNOSIS — Z1231 Encounter for screening mammogram for malignant neoplasm of breast: Secondary | ICD-10-CM | POA: Insufficient documentation

## 2023-12-24 ENCOUNTER — Other Ambulatory Visit: Payer: Self-pay

## 2023-12-24 DIAGNOSIS — E1165 Type 2 diabetes mellitus with hyperglycemia: Secondary | ICD-10-CM

## 2023-12-24 DIAGNOSIS — D649 Anemia, unspecified: Secondary | ICD-10-CM

## 2023-12-24 DIAGNOSIS — E7800 Pure hypercholesterolemia, unspecified: Secondary | ICD-10-CM

## 2023-12-28 ENCOUNTER — Other Ambulatory Visit

## 2023-12-31 ENCOUNTER — Other Ambulatory Visit (INDEPENDENT_AMBULATORY_CARE_PROVIDER_SITE_OTHER)

## 2023-12-31 DIAGNOSIS — E7800 Pure hypercholesterolemia, unspecified: Secondary | ICD-10-CM

## 2023-12-31 DIAGNOSIS — D649 Anemia, unspecified: Secondary | ICD-10-CM

## 2023-12-31 DIAGNOSIS — E1165 Type 2 diabetes mellitus with hyperglycemia: Secondary | ICD-10-CM

## 2023-12-31 LAB — CBC WITH DIFFERENTIAL/PLATELET
Basophils Absolute: 0.1 10*3/uL (ref 0.0–0.1)
Basophils Relative: 0.9 % (ref 0.0–3.0)
Eosinophils Absolute: 0.3 10*3/uL (ref 0.0–0.7)
Eosinophils Relative: 4.2 % (ref 0.0–5.0)
HCT: 37.7 % (ref 36.0–46.0)
Hemoglobin: 12.7 g/dL (ref 12.0–15.0)
Lymphocytes Relative: 32 % (ref 12.0–46.0)
Lymphs Abs: 2.4 10*3/uL (ref 0.7–4.0)
MCHC: 33.6 g/dL (ref 30.0–36.0)
MCV: 93.8 fl (ref 78.0–100.0)
Monocytes Absolute: 0.7 10*3/uL (ref 0.1–1.0)
Monocytes Relative: 9.2 % (ref 3.0–12.0)
Neutro Abs: 4 10*3/uL (ref 1.4–7.7)
Neutrophils Relative %: 53.7 % (ref 43.0–77.0)
Platelets: 219 10*3/uL (ref 150.0–400.0)
RBC: 4.02 Mil/uL (ref 3.87–5.11)
RDW: 13 % (ref 11.5–15.5)
WBC: 7.5 10*3/uL (ref 4.0–10.5)

## 2023-12-31 LAB — LIPID PANEL
Cholesterol: 125 mg/dL (ref 0–200)
HDL: 54.4 mg/dL (ref >39.00)
LDL Cholesterol: 60 mg/dL (ref 0–99)
NonHDL: 70.67
Total CHOL/HDL Ratio: 2
Triglycerides: 52 mg/dL (ref 0.0–149.0)
VLDL: 10.4 mg/dL (ref 0.0–40.0)

## 2023-12-31 LAB — BASIC METABOLIC PANEL WITH GFR
BUN: 17 mg/dL (ref 6–23)
CO2: 29 meq/L (ref 19–32)
Calcium: 9.2 mg/dL (ref 8.4–10.5)
Chloride: 105 meq/L (ref 96–112)
Creatinine, Ser: 1 mg/dL (ref 0.40–1.20)
GFR: 51.31 mL/min — ABNORMAL LOW (ref >60.00)
Glucose, Bld: 107 mg/dL — ABNORMAL HIGH (ref 70–99)
Potassium: 4.1 meq/L (ref 3.5–5.1)
Sodium: 140 meq/L (ref 135–145)

## 2023-12-31 LAB — HEPATIC FUNCTION PANEL
ALT: 2 U/L (ref 0–35)
AST: 11 U/L (ref 0–37)
Albumin: 4 g/dL (ref 3.5–5.2)
Alkaline Phosphatase: 69 U/L (ref 39–117)
Bilirubin, Direct: 0.1 mg/dL (ref 0.0–0.3)
Total Bilirubin: 0.4 mg/dL (ref 0.2–1.2)
Total Protein: 7.1 g/dL (ref 6.0–8.3)

## 2023-12-31 LAB — TSH: TSH: 2.66 u[IU]/mL (ref 0.35–5.50)

## 2024-01-01 LAB — HEMOGLOBIN A1C: Hgb A1c MFr Bld: 6.1 % (ref 4.6–6.5)

## 2024-01-03 ENCOUNTER — Telehealth: Payer: Self-pay

## 2024-01-03 DIAGNOSIS — R001 Bradycardia, unspecified: Secondary | ICD-10-CM | POA: Diagnosis not present

## 2024-01-03 DIAGNOSIS — G20A1 Parkinson's disease without dyskinesia, without mention of fluctuations: Secondary | ICD-10-CM | POA: Diagnosis not present

## 2024-01-03 DIAGNOSIS — R2689 Other abnormalities of gait and mobility: Secondary | ICD-10-CM | POA: Diagnosis not present

## 2024-01-03 NOTE — Telephone Encounter (Signed)
 Copied from CRM [id number]. Topic: Clinical - Lab/Test Results >> Jan 03, 2024  4:00 PM Almira Coaster wrote: Reason for CRM: Patient returning a call for lab results, advised of the message left by Dr.Scott. No questions.

## 2024-01-03 NOTE — Telephone Encounter (Signed)
 Documented in patient result management. See result management.

## 2024-01-28 ENCOUNTER — Other Ambulatory Visit: Payer: Self-pay | Admitting: Internal Medicine

## 2024-01-28 NOTE — Telephone Encounter (Signed)
 Rx ok'd for zoloft  50mg  q day #90 with one refill.

## 2024-02-25 ENCOUNTER — Other Ambulatory Visit: Payer: Self-pay | Admitting: Internal Medicine

## 2024-02-25 DIAGNOSIS — E785 Hyperlipidemia, unspecified: Secondary | ICD-10-CM

## 2024-03-01 ENCOUNTER — Other Ambulatory Visit: Payer: Self-pay | Admitting: Internal Medicine

## 2024-03-01 DIAGNOSIS — R42 Dizziness and giddiness: Secondary | ICD-10-CM

## 2024-03-07 ENCOUNTER — Ambulatory Visit: Admitting: Internal Medicine

## 2024-04-11 ENCOUNTER — Telehealth: Payer: Self-pay | Admitting: Internal Medicine

## 2024-04-11 NOTE — Telephone Encounter (Signed)
 A disability parking placard renewal form has been dropped off for Dr Glendia to sign. It is in her color folder up front. [facility]

## 2024-04-12 NOTE — Telephone Encounter (Signed)
 Signed and placed in box.

## 2024-04-12 NOTE — Telephone Encounter (Signed)
Form completed and placed out for your signature

## 2024-04-12 NOTE — Telephone Encounter (Signed)
 Placed up front for pick up. Care giver is aware/

## 2024-04-17 IMAGING — MR MR HEAD WO/W CM
14 series · 48 of 48 positions shown · IV contrast (gadavist)
Comparison: No pertinent prior exams available for comparison.

CLINICAL DATA: Right history: Tremor. Dizziness. Dizziness,
nonspecific. Additional history provided by scanning technologist:
Vertigo and confusion for 3 months.

EXAM:
MRI HEAD WITHOUT AND WITH CONTRAST
TECHNIQUE: Multiplanar, multiecho pulse sequences of the brain and surrounding
structures were obtained without and with intravenous contrast.
CONTRAST:  5mL GADAVIST GADOBUTROL 1 MMOL/ML IV SOLN

[Series 5: ax dwi_tracew · axial · 3.0mm · 0.65mm/px · z∈[-88,+52]mm · 2 of 44 slices shown]
[im 1/44]
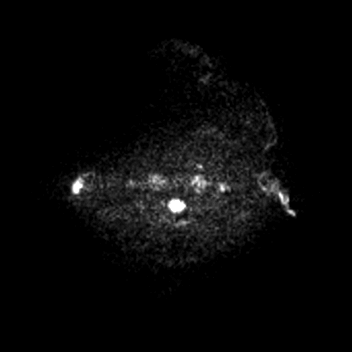
[im 44/44]
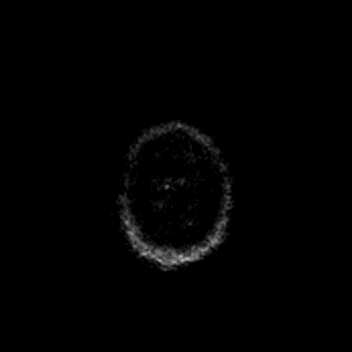

[Series 6: ax dwi_adc · axial · 3.0mm · 0.65mm/px · z∈[-88,+52]mm · 2 of 44 slices shown]
[im 1/44]
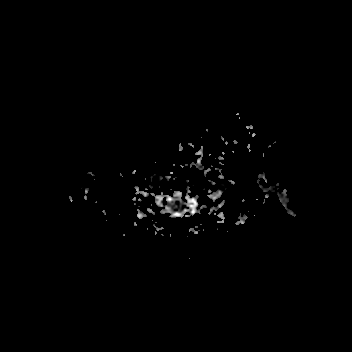
[im 44/44]
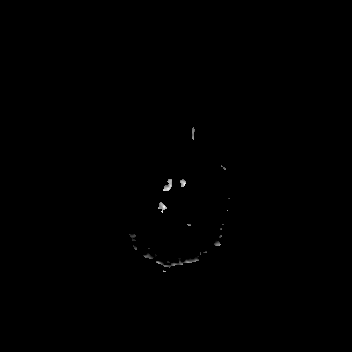

[Series 7: cor dwi_tracew · coronal · 5.0mm · 0.60mm/px · 2 of 34 slices shown]
[im 1/34]
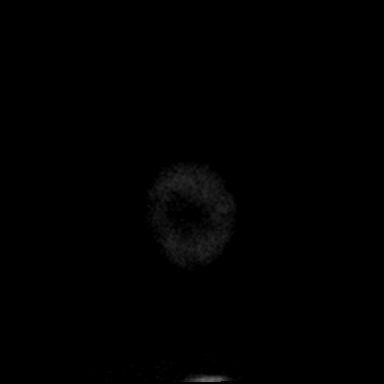
[im 34/34]
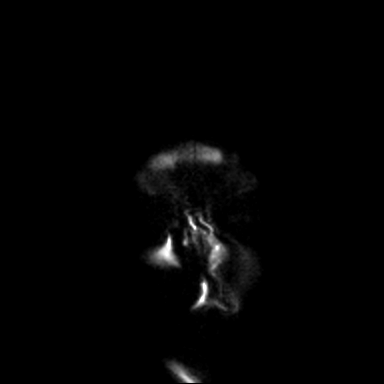

[Series 8: cor dwi_adc · coronal · 5.0mm · 0.60mm/px · 2 of 34 slices shown]
[im 1/34]
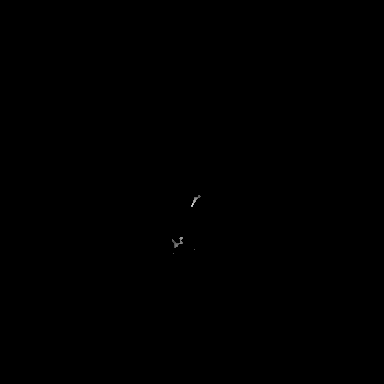
[im 34/34]
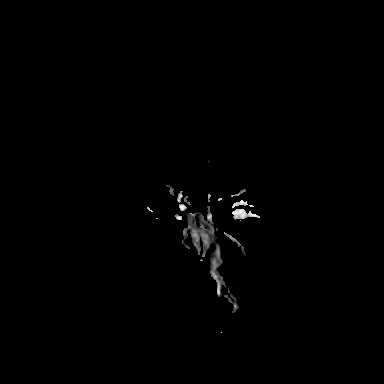

[Series 9: T1 · sagittal · 5.0mm · 0.62mm/px · 1 of 21 slices shown (1 of 2)]
[im 1/21]
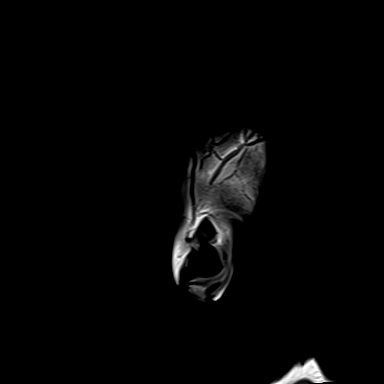

[Series 10: T2 · axial · 5.0mm · 0.53mm/px · 1 of 25 slices shown]
[im 1/25]
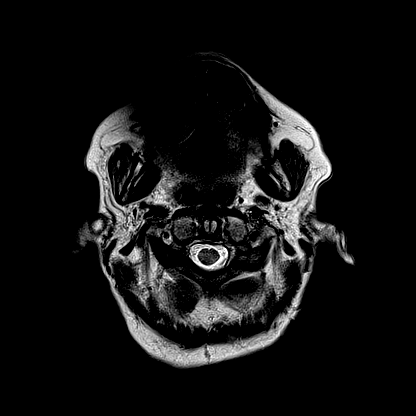

[Series 11: ax swi_mag · axial · 2.0mm · 0.90mm/px · z∈[-93,+62]mm · 5 of 80 slices shown]
[im 1/80]
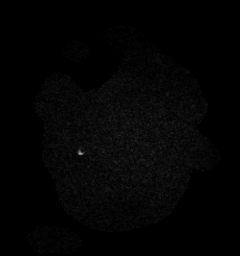
[im 20/80]
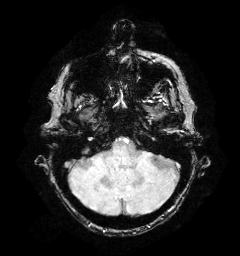
[im 40/80]
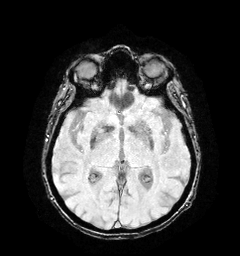
[im 60/80]
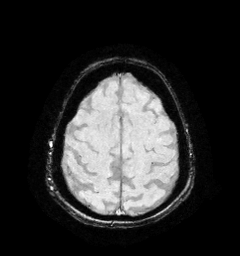
[im 80/80]
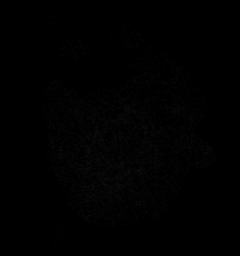

[Series 12: ax swi_pha · axial · 2.0mm · 0.90mm/px · z∈[-93,+62]mm · 5 of 80 slices shown]
[im 1/80]
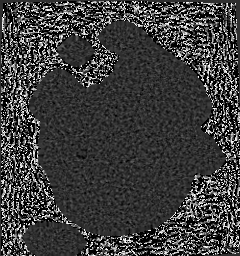
[im 20/80]
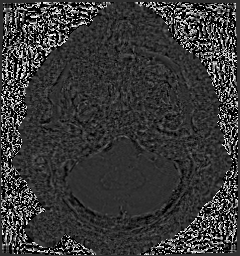
[im 40/80]
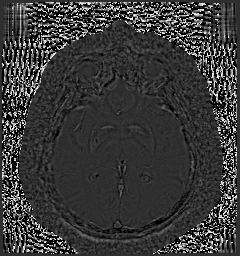
[im 60/80]
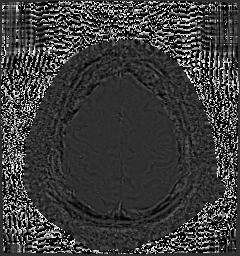
[im 80/80]
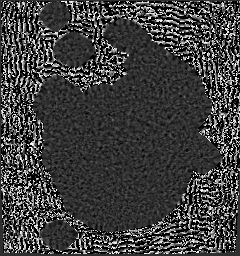

[Series 13: ax swi_swi · axial · 2.0mm · 0.90mm/px · z∈[-93,+62]mm · 5 of 80 slices shown]
[im 1/80]
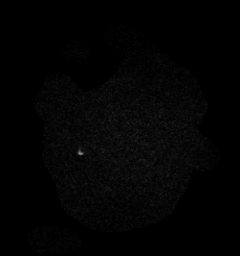
[im 20/80]
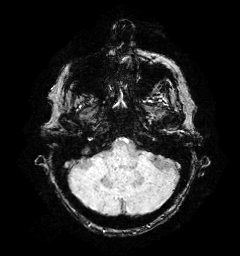
[im 40/80]
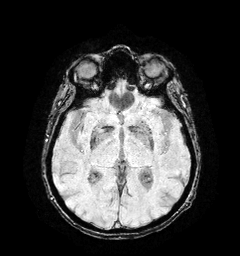
[im 60/80]
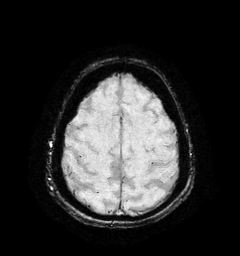
[im 80/80]
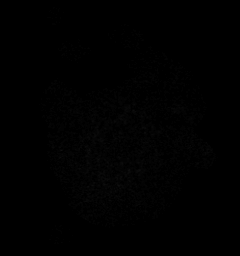

[Series 15: FLAIR · axial · 3.0mm · 0.53mm/px · z∈[-96,+63]mm · 3 of 55 slices shown]
[im 1/55]
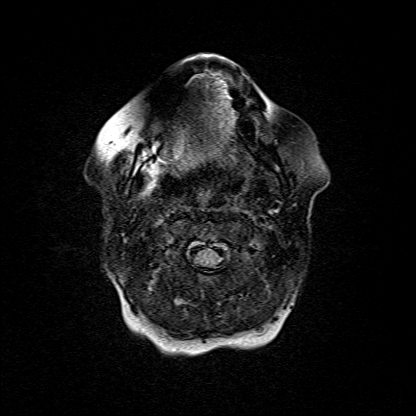
[im 28/55]
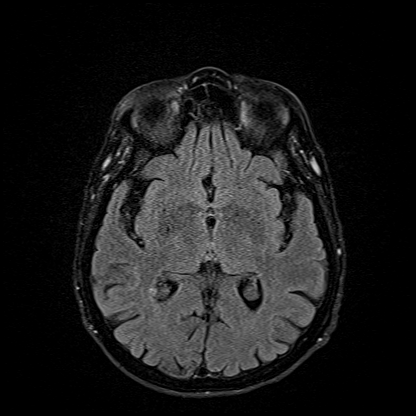
[im 55/55]
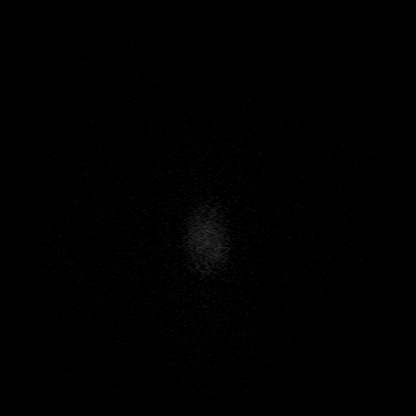

[Series 16: T1 · axial · 1.0mm · 0.98mm/px · z∈[-84,+56]mm · 8 of 144 slices shown (2 of 2)]
[im 1/144]
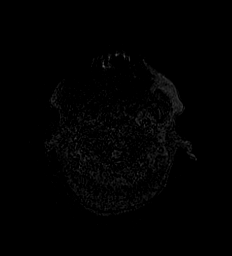
[im 21/144]
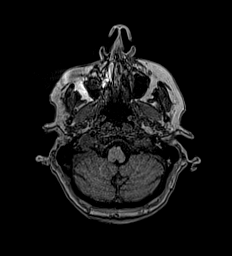
[im 41/144]
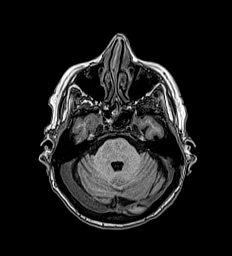
[im 62/144]
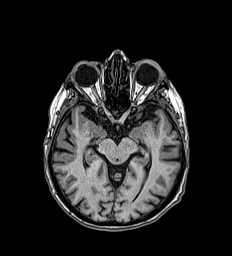
[im 82/144]
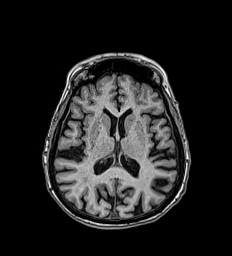
[im 103/144]
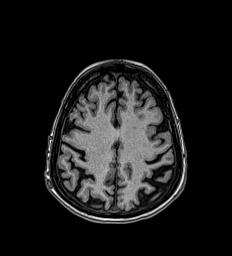
[im 123/144]
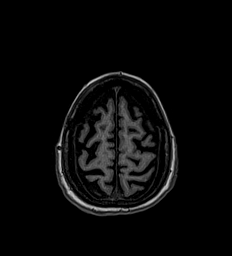
[im 144/144]
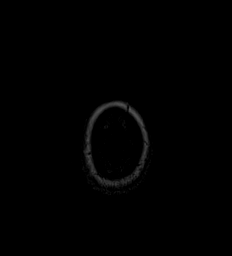

[Series 17: T2 post-contrast · coronal · 5.0mm · 0.57mm/px · 2 of 27 slices shown]
[im 1/27]
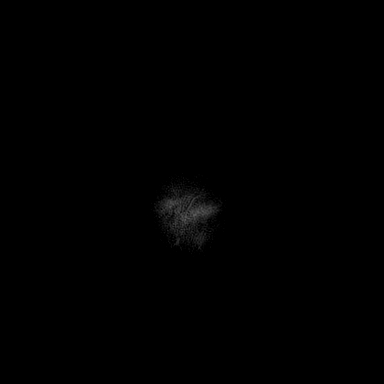
[im 27/27]
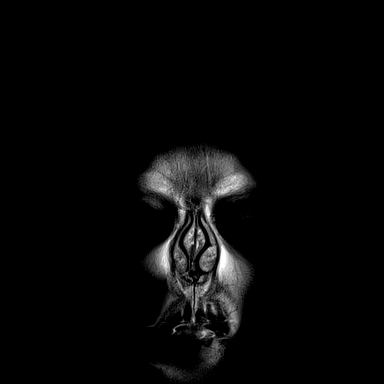

[Series 18: T1 post-contrast · axial · 1.0mm · 0.98mm/px · z∈[-84,+56]mm · 8 of 144 slices shown (1 of 2)]
[im 1/144]
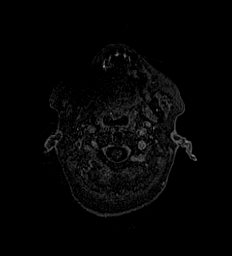
[im 21/144]
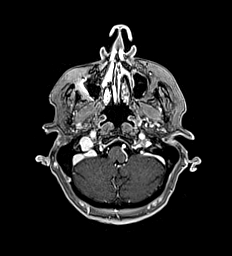
[im 41/144]
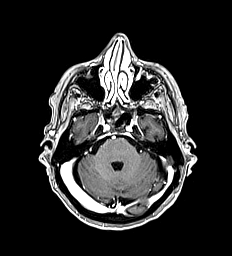
[im 62/144]
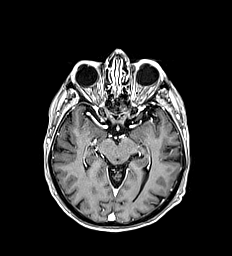
[im 82/144]
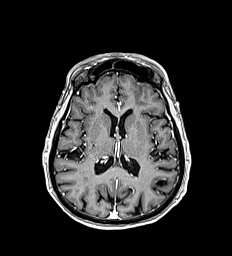
[im 103/144]
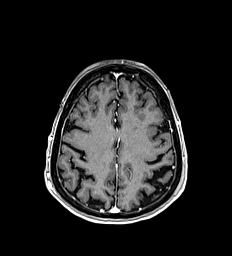
[im 123/144]
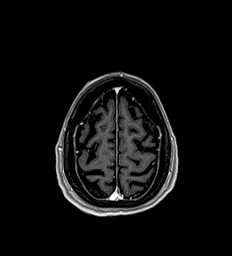
[im 144/144]
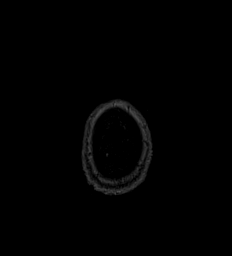

[Series 19: T1 post-contrast · coronal · 5.0mm · 0.57mm/px · 2 of 27 slices shown (2 of 2)]
[im 1/27]
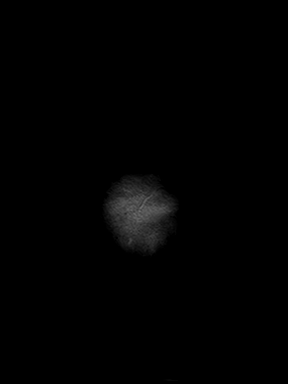
[im 27/27]
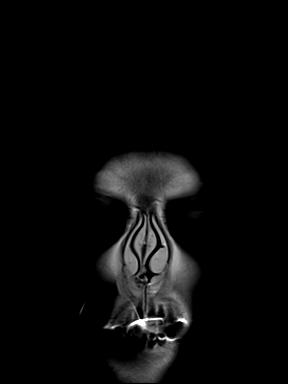

[48 of 48 positions shown; findings below may reference images not displayed]

FINDINGS: Brain:

No age advanced or lobar predominant parenchymal atrophy.

Multifocal T2 FLAIR hyperintense signal abnormality within the
cerebral white matter and pons, nonspecific but compatible with mild
chronic small vessel ischemic disease.

Prominent perivascular space within the inferior right basal
ganglia.

Subcentimeter chronic infarct within the right cerebellar hemisphere
(series 10, image 5).

There is no acute infarct.

No evidence of an intracranial mass.

No chronic intracranial blood products.

No extra-axial fluid collection.

No midline shift.

No pathologic intracranial enhancement identified.

Vascular: Maintained flow voids within the proximal large arterial
vessels.

Skull and upper cervical spine: No focal suspicious marrow lesion.

Sinuses/Orbits: No mass or acute finding within the imaged orbits.
Prior bilateral ocular lens replacement. Mild mucosal thickening
within the bilateral ethmoid, right sphenoid and bilateral maxillary
sinuses.
IMPRESSION: No evidence of acute intracranial abnormality.

Mild chronic small vessel ischemic changes within the cerebral white
matter and pons.

Subcentimeter chronic infarct within the right cerebellar
hemisphere.

Mild paranasal sinus mucosal thickening.

## 2024-04-28 NOTE — Telephone Encounter (Signed)
 Neighbor Darlene came to pick up placard form today. [facility]

## 2024-05-03 ENCOUNTER — Telehealth (INDEPENDENT_AMBULATORY_CARE_PROVIDER_SITE_OTHER): Payer: Self-pay | Admitting: Internal Medicine

## 2024-05-03 VITALS — Ht 61.0 in | Wt 111.5 lb

## 2024-05-03 DIAGNOSIS — R634 Abnormal weight loss: Secondary | ICD-10-CM | POA: Diagnosis not present

## 2024-05-03 DIAGNOSIS — N1831 Chronic kidney disease, stage 3a: Secondary | ICD-10-CM | POA: Diagnosis not present

## 2024-05-03 DIAGNOSIS — E7800 Pure hypercholesterolemia, unspecified: Secondary | ICD-10-CM

## 2024-05-03 DIAGNOSIS — F439 Reaction to severe stress, unspecified: Secondary | ICD-10-CM

## 2024-05-03 DIAGNOSIS — E1165 Type 2 diabetes mellitus with hyperglycemia: Secondary | ICD-10-CM

## 2024-05-03 DIAGNOSIS — I10 Essential (primary) hypertension: Secondary | ICD-10-CM

## 2024-05-03 DIAGNOSIS — R739 Hyperglycemia, unspecified: Secondary | ICD-10-CM

## 2024-05-03 NOTE — Progress Notes (Signed)
 Patient ID: Kara Dixon, female   DOB: 1936-03-28, 88 y.o.   MRN: [id number]   Virtual Visit via video Note  I connected with Kara Dixon by a video enabled telemedicine application or telephone and verified that I am speaking with the correct person using two identifiers. Location patient: home Location provider: work  Persons participating in the virtual visit: patient, provider  The limitations, risks, security and privacy concerns of performing an evaluation and management service by video and the availability of in person appointments have bene discussed. It has also been discussed with the patient that there may be a patient responsible charge related to this service. The patient expressed understanding and agreed to proceed.  Interactive audio and video telecommunications were attempted between this provider and patient, however failed, due to patient having technical difficulties OR patient did not have access to video capability.  We continued and completed visit with audio only.   Reason for visit: follow up appt  HPI: Follow up regarding hypercholesterolemia, diabetes and hypertension. Remains on zoloft . Saw neurology 01/03/24 - f/u parkinsonism. Reported hallucinations. Recommended to continue to monitor halluciations. Continue sinemet 25/100 tid. Follow up - weight loss. Reports weight is stable - weight 111.5 pounds. Eating. No nausea or vomiting. No abdominal pain. Bowels stable. Having issues with her teeth. Request information - no dental insurance. Discussed information - [employer] [facility] [employer].    ROS: See pertinent positives and negatives per HPI.  Past Medical History:  Diagnosis Date   Cancer (HCC)    skin   CKD (chronic kidney disease), stage III (HCC)    Hypercholesterolemia    Hypertension    Nephrolithiasis    Osteopenia    Parkinson's disease (HCC)    Type 2 diabetes mellitus (HCC)    Vertigo    Vitamin D  deficiency     Past Surgical History:  Procedure  Laterality Date   BREAST BIOPSY Left 12/10/2022   LEFT stereo bx, calcs, COIL clip-path pending   BREAST BIOPSY Left 12/10/2022   MM LT BREAST BX W LOC DEV 1ST LESION IMAGE BX SPEC STEREO GUIDE 12/10/2022 [facility]   CATARACT EXTRACTION W/PHACO Right 02/11/2021   Procedure: CATARACT EXTRACTION PHACO AND INTRAOCULAR LENS PLACEMENT (IOC) RIGHT;  Surgeon: Jaye Fallow, MD;  Location: [facility];  Service: Ophthalmology;  Laterality: Right;  CDE15.19 01:16.1 minutes   CATARACT EXTRACTION W/PHACO Left 02/25/2021   Procedure: CATARACT EXTRACTION PHACO AND INTRAOCULAR LENS PLACEMENT (IOC) LEFT;  Surgeon: Jaye Fallow, MD;  Location: [facility];  Service: Ophthalmology;  Laterality: Left;  18.25 01:23.4   cyst removed under tongue  age 41   ECTROPION REPAIR Right 01/22/2023   Procedure: ECTROPION REPAIR, TARSAL WEDGE ECTROPION REPAIR, EXTENSIVE RIGHT LOWER LID;  Surgeon: Ashley Greig HERO, MD;  Location: [facility];  Service: Ophthalmology;  Laterality: Right;   EYE SURGERY  02/2017   EYE LID; care everywhere    Family History  Problem Relation Age of Onset   Stroke Mother    Hypertension Mother    Other Father 14   Fibromyalgia Sister    Breast cancer Neg Hx    Colon cancer Neg Hx     SOCIAL HX: reviewed.    Current Outpatient Medications:    aspirin 81 MG tablet, Take 81 mg by mouth daily., Disp: , Rfl:    atenolol  (TENORMIN ) 50 MG tablet, TAKE 1 TABLET BY MOUTH EVERY DAY, Disp: 90 tablet, Rfl: 1   carbidopa-levodopa (SINEMET IR) 25-100 MG tablet, Take 1 tablet  by mouth 3 (three) times daily., Disp: , Rfl:    Cholecalciferol (VITAMIN D3 SUPER STRENGTH) 50 MCG (2000 UT) TABS, Take by mouth at bedtime., Disp: , Rfl:    Cyanocobalamin  (B-12 COMPLIANCE INJECTION) 1000 MCG/ML KIT, Inject as directed., Disp: , Rfl:    lactase (LACTAID) 3000 UNITS tablet, Take 1 tablet by mouth as needed., Disp: , Rfl:    meclizine  (ANTIVERT ) 25 MG tablet, Take 25 mg by  mouth 2 (two) times daily as needed., Disp: , Rfl:    ondansetron  (ZOFRAN -ODT) 4 MG disintegrating tablet, Take 1 tablet (4 mg total) by mouth every 8 (eight) hours as needed for nausea or vomiting., Disp: 30 tablet, Rfl: 0   rosuvastatin  (CRESTOR ) 10 MG tablet, TAKE 1 TABLET BY MOUTH EVERY DAY, Disp: 90 tablet, Rfl: 3   sertraline  (ZOLOFT ) 50 MG tablet, Take 1 tablet (50 mg total) by mouth daily., Disp: 90 tablet, Rfl: 1  EXAM:  GENERAL: alert, oriented, sounds to be well and in no acute distress  PSYCH/NEURO: pleasant and cooperative, no obvious depression or anxiety, speech and thought processing grossly intact  ASSESSMENT AND PLAN:  Discussed the following assessment and plan:  Problem List Items Addressed This Visit     Weight loss   Weight stable. Weight 111.5 pounds. Discussed importance of eating regular meals. Follow.       Type 2 diabetes mellitus with hyperglycemia (HCC)   Follow met b and A1c.       Stress   Continues on zoloft . Appears to be stable.       Hypertension   Follow pressures. Follow metabolic panel. Continue atenolol .       Hyperglycemia   Follow met b and A1c.       Hypercholesterolemia   On crestor .  Follow lipid panel.  Lab Results  Component Value Date   CHOL 125 12/31/2023   HDL 54.40 12/31/2023   LDLCALC 60 12/31/2023   LDLDIRECT 139.4 05/12/2013   TRIG 52.0 12/31/2023   CHOLHDL 2 12/31/2023         CKD (chronic kidney disease) stage 3, GFR 30-59 ml/min (HCC) - Primary   Avoid antiinflammatory medication. Stay hydrated.  Follow metabolic panel.        No follow-ups on file.   I discussed the assessment and treatment plan with the patient. The patient was provided an opportunity to ask questions and all were answered. The patient agreed with the plan and demonstrated an understanding of the instructions.   The patient was advised to call back or seek an in-person evaluation if the symptoms worsen or if the condition fails to  improve as anticipated.  I provided 20 minutes of non-face-to-face time during this encounter.   Allena Hamilton, MD

## 2024-05-07 ENCOUNTER — Encounter: Payer: Self-pay | Admitting: Internal Medicine

## 2024-05-07 NOTE — Assessment & Plan Note (Signed)
 On crestor .  Follow lipid panel.  Lab Results  Component Value Date   CHOL 125 12/31/2023   HDL 54.40 12/31/2023   LDLCALC 60 12/31/2023   LDLDIRECT 139.4 05/12/2013   TRIG 52.0 12/31/2023   CHOLHDL 2 12/31/2023

## 2024-05-07 NOTE — Assessment & Plan Note (Signed)
 Continues on zoloft . Appears to be stable.

## 2024-05-07 NOTE — Assessment & Plan Note (Signed)
 Follow pressures. Follow metabolic panel. Continue atenolol .

## 2024-05-07 NOTE — Assessment & Plan Note (Signed)
 Weight stable. Weight 111.5 pounds. Discussed importance of eating regular meals. Follow.

## 2024-05-07 NOTE — Assessment & Plan Note (Signed)
Avoid antiinflammatory medication.  Stay hydrated.  Follow metabolic panel.

## 2024-05-07 NOTE — Assessment & Plan Note (Signed)
 Follow met b and A1c.

## 2024-05-22 DIAGNOSIS — H43813 Vitreous degeneration, bilateral: Secondary | ICD-10-CM | POA: Diagnosis not present

## 2024-05-22 DIAGNOSIS — Z961 Presence of intraocular lens: Secondary | ICD-10-CM | POA: Diagnosis not present

## 2024-05-22 DIAGNOSIS — H02403 Unspecified ptosis of bilateral eyelids: Secondary | ICD-10-CM | POA: Diagnosis not present

## 2024-06-12 ENCOUNTER — Ambulatory Visit: Admitting: Internal Medicine

## 2024-06-12 VITALS — BP 128/70 | HR 66 | Resp 16 | Ht 61.0 in | Wt 110.6 lb

## 2024-06-12 DIAGNOSIS — I10 Essential (primary) hypertension: Secondary | ICD-10-CM

## 2024-06-12 DIAGNOSIS — Z23 Encounter for immunization: Secondary | ICD-10-CM

## 2024-06-12 DIAGNOSIS — E7800 Pure hypercholesterolemia, unspecified: Secondary | ICD-10-CM

## 2024-06-12 DIAGNOSIS — E559 Vitamin D deficiency, unspecified: Secondary | ICD-10-CM

## 2024-06-12 DIAGNOSIS — E1165 Type 2 diabetes mellitus with hyperglycemia: Secondary | ICD-10-CM | POA: Diagnosis not present

## 2024-06-12 DIAGNOSIS — N1831 Chronic kidney disease, stage 3a: Secondary | ICD-10-CM | POA: Diagnosis not present

## 2024-06-12 DIAGNOSIS — R739 Hyperglycemia, unspecified: Secondary | ICD-10-CM

## 2024-06-12 DIAGNOSIS — R634 Abnormal weight loss: Secondary | ICD-10-CM

## 2024-06-12 DIAGNOSIS — R350 Frequency of micturition: Secondary | ICD-10-CM

## 2024-06-12 DIAGNOSIS — Z862 Personal history of diseases of the blood and blood-forming organs and certain disorders involving the immune mechanism: Secondary | ICD-10-CM

## 2024-06-12 DIAGNOSIS — F439 Reaction to severe stress, unspecified: Secondary | ICD-10-CM

## 2024-06-12 DIAGNOSIS — R928 Other abnormal and inconclusive findings on diagnostic imaging of breast: Secondary | ICD-10-CM | POA: Diagnosis not present

## 2024-06-12 NOTE — Progress Notes (Unsigned)
 Subjective:    Patient ID: Kara Dixon, female    DOB: 02-21-1936, 88 y.o.   MRN: [id number]  Patient here for  Chief Complaint  Patient presents with   Medical Management of Chronic Issues    HPI Here for a scheduled follow up - follow up regarding hypercholesterolemia, diabetes and hypertension. She is accompanied by her neighbor. History obtained from both of them. Remains on zoloft . Saw neurology 01/03/24 - f/u parkinsonism. Reported hallucinations. Recommended to continue to monitor halluciations. Hallucinations are persistent - per neighbor. Does not appear to scare her of bother her. Continue sinemet 25/100 tid. Follow up - weight loss. Last visit, weight 111.5 pounds. Relatively stable from last check. Dsicussed eating regular meals. Breathing stable. No abdominal pain or bowel change reported.    Past Medical History:  Diagnosis Date   Cancer (HCC)    skin   CKD (chronic kidney disease), stage III (HCC)    Hypercholesterolemia    Hypertension    Nephrolithiasis    Osteopenia    Parkinson's disease (HCC)    Type 2 diabetes mellitus (HCC)    Vertigo    Vitamin D  deficiency    Past Surgical History:  Procedure Laterality Date   BREAST BIOPSY Left 12/10/2022   LEFT stereo bx, calcs, COIL clip-path pending   BREAST BIOPSY Left 12/10/2022   MM LT BREAST BX W LOC DEV 1ST LESION IMAGE BX SPEC STEREO GUIDE 12/10/2022 [facility]   CATARACT EXTRACTION W/PHACO Right 02/11/2021   Procedure: CATARACT EXTRACTION PHACO AND INTRAOCULAR LENS PLACEMENT (IOC) RIGHT;  Surgeon: Jaye Fallow, MD;  Location: [facility];  Service: Ophthalmology;  Laterality: Right;  CDE15.19 01:16.1 minutes   CATARACT EXTRACTION W/PHACO Left 02/25/2021   Procedure: CATARACT EXTRACTION PHACO AND INTRAOCULAR LENS PLACEMENT (IOC) LEFT;  Surgeon: Jaye Fallow, MD;  Location: [facility];  Service: Ophthalmology;  Laterality: Left;  18.25 01:23.4   cyst removed under tongue  age 32    ECTROPION REPAIR Right 01/22/2023   Procedure: ECTROPION REPAIR, TARSAL WEDGE ECTROPION REPAIR, EXTENSIVE RIGHT LOWER LID;  Surgeon: Ashley Greig HERO, MD;  Location: [facility];  Service: Ophthalmology;  Laterality: Right;   EYE SURGERY  02/2017   EYE LID; care everywhere   Family History  Problem Relation Age of Onset   Stroke Mother    Hypertension Mother    Other Father 76   Fibromyalgia Sister    Breast cancer Neg Hx    Colon cancer Neg Hx    Social History   Socioeconomic History   Marital status: Widowed    Spouse name: Not on file   Number of children: 1   Years of education: Not on file   Highest education level: Associate degree: academic program  Occupational History   Occupation: retired  Tobacco Use   Smoking status: Never   Smokeless tobacco: Never  Vaping Use   Vaping status: Never Used  Substance and Sexual Activity   Alcohol use: No    Alcohol/week: 0.0 standard drinks of alcohol   Drug use: No   Sexual activity: Not Currently  Other Topics Concern   Not on file  Social History Narrative   Husband deceased 2022-11-08   [place]a, neighbor helps patient with daily activities and transportation   Social Drivers of Health   Financial Resource Strain: Low Risk  (04/29/2024)   Overall Financial Resource Strain (CARDIA)    Difficulty of Paying Living Expenses: Not hard at all  Food Insecurity: No Food Insecurity (04/29/2024)  Hunger Vital Sign    Worried About Running Out of Food in the Last Year: Never true    Ran Out of Food in the Last Year: Never true  Transportation Needs: No Transportation Needs (04/29/2024)   PRAPARE - Administrator, Civil Service (Medical): No    Lack of Transportation (Non-Medical): No  Physical Activity: Insufficiently Active (04/29/2024)   Exercise Vital Sign    Days of Exercise per Week: 2 days    Minutes of Exercise per Session: 10 min  Stress: No Stress Concern Present (04/29/2024)   [employer] of  Occupational Health - Occupational Stress Questionnaire    Feeling of Stress: Not at all  Social Connections: Moderately Isolated (04/29/2024)   Social Connection and Isolation Panel    Frequency of Communication with Friends and Family: More than three times a week    Frequency of Social Gatherings with Friends and Family: Twice a week    Attends Religious Services: 1 to 4 times per year    Active Member of [employer] or Organizations: No    Attends Banker Meetings: Not on file    Marital Status: Widowed     Review of Systems  Constitutional:  Negative for appetite change and unexpected weight change.  HENT:  Negative for congestion and sinus pressure.   Respiratory:  Negative for cough, chest tightness and shortness of breath.   Cardiovascular:  Negative for chest pain, palpitations and leg swelling.  Gastrointestinal:  Negative for abdominal pain, diarrhea, nausea and vomiting.  Genitourinary:  Negative for difficulty urinating and dysuria.  Musculoskeletal:  Negative for joint swelling and myalgias.  Skin:  Negative for color change and rash.  Neurological:  Negative for dizziness and headaches.  Psychiatric/Behavioral:  Negative for agitation and dysphoric mood.        Objective:     BP 128/70   Pulse 66   Resp 16   Ht 5' 1 (1.549 m)   Wt 110 lb 9.6 oz (50.2 kg)   SpO2 98%   BMI 20.90 kg/m  Wt Readings from Last 3 Encounters:  06/12/24 110 lb 9.6 oz (50.2 kg)  05/03/24 111 lb 8 oz (50.6 kg)  12/06/23 109 lb 3.2 oz (49.5 kg)    Physical Exam Vitals reviewed.  Constitutional:      General: She is not in acute distress.    Appearance: Normal appearance.  HENT:     Head: Normocephalic and atraumatic.     Right Ear: External ear normal.     Left Ear: External ear normal.     Mouth/Throat:     Pharynx: No oropharyngeal exudate or posterior oropharyngeal erythema.  Eyes:     General: No scleral icterus.       Right eye: No discharge.        Left eye:  No discharge.     Conjunctiva/sclera: Conjunctivae normal.  Neck:     Thyroid : No thyromegaly.  Cardiovascular:     Rate and Rhythm: Normal rate and regular rhythm.  Pulmonary:     Effort: No respiratory distress.     Breath sounds: Normal breath sounds. No wheezing.  Abdominal:     General: Bowel sounds are normal.     Palpations: Abdomen is soft.     Tenderness: There is no abdominal tenderness.  Musculoskeletal:        General: No swelling or tenderness.     Cervical back: Neck supple. No tenderness.  Lymphadenopathy:     Cervical:  No cervical adenopathy.  Skin:    Findings: No erythema or rash.  Neurological:     Mental Status: She is alert.  Psychiatric:        Mood and Affect: Mood normal.        Behavior: Behavior normal.         Outpatient Encounter Medications as of 06/12/2024  Medication Sig   aspirin 81 MG tablet Take 81 mg by mouth daily.   atenolol  (TENORMIN ) 50 MG tablet TAKE 1 TABLET BY MOUTH EVERY DAY   carbidopa-levodopa (SINEMET IR) 25-100 MG tablet Take 1 tablet by mouth 3 (three) times daily.   Cholecalciferol (VITAMIN D3 SUPER STRENGTH) 50 MCG (2000 UT) TABS Take by mouth at bedtime.   Cyanocobalamin  (B-12 COMPLIANCE INJECTION) 1000 MCG/ML KIT Inject as directed.   lactase (LACTAID) 3000 UNITS tablet Take 1 tablet by mouth as needed.   meclizine  (ANTIVERT ) 25 MG tablet Take 25 mg by mouth 2 (two) times daily as needed.   ondansetron  (ZOFRAN -ODT) 4 MG disintegrating tablet Take 1 tablet (4 mg total) by mouth every 8 (eight) hours as needed for nausea or vomiting.   rosuvastatin  (CRESTOR ) 10 MG tablet TAKE 1 TABLET BY MOUTH EVERY DAY   sertraline  (ZOLOFT ) 50 MG tablet Take 1 tablet (50 mg total) by mouth daily.   No facility-administered encounter medications on file as of 06/12/2024.     Lab Results  Component Value Date   WBC 9.2 06/12/2024   HGB 12.8 06/12/2024   HCT 38.1 06/12/2024   PLT 231.0 06/12/2024   GLUCOSE 115 (H) 06/12/2024   CHOL 113  06/12/2024   TRIG 80.0 06/12/2024   HDL 48.90 06/12/2024   LDLDIRECT 139.4 05/12/2013   LDLCALC 48 06/12/2024   ALT 2 06/12/2024   AST 12 06/12/2024   NA 141 06/12/2024   K 3.9 06/12/2024   CL 104 06/12/2024   CREATININE 1.06 06/12/2024   BUN 20 06/12/2024   CO2 30 06/12/2024   TSH 2.66 12/31/2023   HGBA1C 6.2 06/12/2024    MM 3D SCREENING MAMMOGRAM BILATERAL BREAST Result Date: 12/20/2023 CLINICAL DATA:  Screening. EXAM: DIGITAL SCREENING BILATERAL MAMMOGRAM WITH TOMOSYNTHESIS AND CAD TECHNIQUE: Bilateral screening digital craniocaudal and mediolateral oblique mammograms were obtained. Bilateral screening digital breast tomosynthesis was performed. The images were evaluated with computer-aided detection. COMPARISON:  Previous exam(s). ACR Breast Density Category b: There are scattered areas of fibroglandular density. FINDINGS: There are no findings suspicious for malignancy. IMPRESSION: No mammographic evidence of malignancy. A result letter of this screening mammogram will be mailed directly to the patient. RECOMMENDATION: Screening mammogram in one year. (Code:SM-B-01Y) BI-RADS CATEGORY  1: Negative. Electronically Signed   By: Dina  Arceo M.D.   On: 12/20/2023 16:09       Assessment & Plan:  Primary hypertension Assessment & Plan: Continue atenolol . Follow pressures. Follow metabolic panel.   Orders: -     Basic metabolic panel with GFR  Hyperglycemia Assessment & Plan: Follow met b and A1c.   Orders: -     Hemoglobin A1c  Hypercholesterolemia Assessment & Plan: Continue crestor . Follow lipid panel.  Lab Results  Component Value Date   CHOL 113 06/12/2024   HDL 48.90 06/12/2024   LDLCALC 48 06/12/2024   LDLDIRECT 139.4 05/12/2013   TRIG 80.0 06/12/2024   CHOLHDL 2 06/12/2024     Orders: -     Hepatic function panel -     Lipid panel  Stage 3a chronic kidney disease (HCC) Assessment & Plan:  Avoid antiinflammatory medication. Stay hydrated.  Follow metabolic  panel.   Orders: -     Urinalysis, Routine w reflex microscopic  History of anemia -     CBC with Differential/Platelet -     IBC + Ferritin  Urinary frequency -     Urine Culture  Immunization due -     Flu vaccine HIGH DOSE PF(Fluzone  Trivalent)  Abnormal mammogram Assessment & Plan: Mammogram 12/02/22 - recommended f/u left breast mammogram.  Left breast mammogram 12/02/22 - recommended biopsy.  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 - FATTY BREAST TISSUE. Patient instructed to continue monthly self breast examinations and resume annual bilateral screening mammogram due February 2025. Mammogram 3.27.25 - Birads I.    Weight loss Assessment & Plan: Weight stable from last check. Encourage eating regular meals. Follow.    Vitamin D  deficiency Assessment & Plan: Check vitamin D  level with next labs.    Type 2 diabetes mellitus with hyperglycemia, without long-term current use of insulin (HCC) Assessment & Plan: Follow met b and A1c.  Lab Results  Component Value Date   HGBA1C 6.2 06/12/2024      Stress Assessment & Plan: Contiue zoloft . Overall stable. Follow.       Allena Hamilton, MD

## 2024-06-13 LAB — LIPID PANEL
Cholesterol: 113 mg/dL (ref 0–200)
HDL: 48.9 mg/dL (ref >39.00)
LDL Cholesterol: 48 mg/dL (ref 0–99)
NonHDL: 63.79
Total CHOL/HDL Ratio: 2
Triglycerides: 80 mg/dL (ref 0.0–149.0)
VLDL: 16 mg/dL (ref 0.0–40.0)

## 2024-06-13 LAB — IBC + FERRITIN
Ferritin: 49.4 ng/mL (ref 10.0–291.0)
Iron: 110 ug/dL (ref 42–145)
Saturation Ratios: 34.9 % (ref 20.0–50.0)
TIBC: 315 ug/dL (ref 250.0–450.0)
Transferrin: 225 mg/dL (ref 212.0–360.0)

## 2024-06-13 LAB — HEPATIC FUNCTION PANEL
ALT: 2 U/L (ref 0–35)
AST: 12 U/L (ref 0–37)
Albumin: 4.1 g/dL (ref 3.5–5.2)
Alkaline Phosphatase: 75 U/L (ref 39–117)
Bilirubin, Direct: 0.1 mg/dL (ref 0.0–0.3)
Total Bilirubin: 0.4 mg/dL (ref 0.2–1.2)
Total Protein: 7.3 g/dL (ref 6.0–8.3)

## 2024-06-13 LAB — BASIC METABOLIC PANEL WITH GFR
BUN: 20 mg/dL (ref 6–23)
CO2: 30 meq/L (ref 19–32)
Calcium: 9.5 mg/dL (ref 8.4–10.5)
Chloride: 104 meq/L (ref 96–112)
Creatinine, Ser: 1.06 mg/dL (ref 0.40–1.20)
GFR: 47.7 mL/min — ABNORMAL LOW (ref >60.00)
Glucose, Bld: 115 mg/dL — ABNORMAL HIGH (ref 70–99)
Potassium: 3.9 meq/L (ref 3.5–5.1)
Sodium: 141 meq/L (ref 135–145)

## 2024-06-13 LAB — URINALYSIS, ROUTINE W REFLEX MICROSCOPIC
Bilirubin Urine: NEGATIVE
Hgb urine dipstick: NEGATIVE
Ketones, ur: NEGATIVE
Nitrite: NEGATIVE
Specific Gravity, Urine: 1.02 (ref 1.000–1.030)
Total Protein, Urine: NEGATIVE
Urine Glucose: NEGATIVE
Urobilinogen, UA: 0.2 (ref 0.0–1.0)
pH: 6 (ref 5.0–8.0)

## 2024-06-13 LAB — URINE CULTURE
MICRO NUMBER:: 16936548
Result:: NO GROWTH
SPECIMEN QUALITY:: ADEQUATE

## 2024-06-13 LAB — CBC WITH DIFFERENTIAL/PLATELET
Basophils Absolute: 0.1 K/uL (ref 0.0–0.1)
Basophils Relative: 1 % (ref 0.0–3.0)
Eosinophils Absolute: 0.3 K/uL (ref 0.0–0.7)
Eosinophils Relative: 3.4 % (ref 0.0–5.0)
HCT: 38.1 % (ref 36.0–46.0)
Hemoglobin: 12.8 g/dL (ref 12.0–15.0)
Lymphocytes Relative: 24.4 % (ref 12.0–46.0)
Lymphs Abs: 2.3 K/uL (ref 0.7–4.0)
MCHC: 33.5 g/dL (ref 30.0–36.0)
MCV: 94.1 fl (ref 78.0–100.0)
Monocytes Absolute: 0.9 K/uL (ref 0.1–1.0)
Monocytes Relative: 9.5 % (ref 3.0–12.0)
Neutro Abs: 5.7 K/uL (ref 1.4–7.7)
Neutrophils Relative %: 61.7 % (ref 43.0–77.0)
Platelets: 231 K/uL (ref 150.0–400.0)
RBC: 4.05 Mil/uL (ref 3.87–5.11)
RDW: 11.7 % (ref 11.5–15.5)
WBC: 9.2 K/uL (ref 4.0–10.5)

## 2024-06-13 LAB — HEMOGLOBIN A1C: Hgb A1c MFr Bld: 6.2 % (ref 4.6–6.5)

## 2024-06-15 ENCOUNTER — Ambulatory Visit: Payer: Self-pay | Admitting: Internal Medicine

## 2024-06-18 ENCOUNTER — Encounter: Payer: Self-pay | Admitting: Internal Medicine

## 2024-06-18 NOTE — Assessment & Plan Note (Signed)
 Follow met b and A1c.  Lab Results  Component Value Date   HGBA1C 6.2 06/12/2024

## 2024-06-18 NOTE — Assessment & Plan Note (Signed)
 Continue atenolol.  Follow pressures.  Follow metabolic panel.

## 2024-06-18 NOTE — Assessment & Plan Note (Signed)
 Follow met b and A1c.

## 2024-06-18 NOTE — Assessment & Plan Note (Signed)
 Contiue zoloft . Overall stable. Follow.

## 2024-06-18 NOTE — Assessment & Plan Note (Signed)
 Check vitamin D level with next labs.  ?

## 2024-06-18 NOTE — Assessment & Plan Note (Signed)
 Weight stable from last check. Encourage eating regular meals. Follow.

## 2024-06-18 NOTE — Assessment & Plan Note (Signed)
 Mammogram 12/02/22 - recommended f/u left breast mammogram.  Left breast mammogram 12/02/22 - recommended biopsy.  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 - FATTY BREAST TISSUE. Patient instructed to continue monthly self breast examinations and resume annual bilateral screening mammogram due February 2025. Mammogram 3.27.25 - Birads I.

## 2024-06-18 NOTE — Assessment & Plan Note (Signed)
 Continue crestor . Follow lipid panel.  Lab Results  Component Value Date   CHOL 113 06/12/2024   HDL 48.90 06/12/2024   LDLCALC 48 06/12/2024   LDLDIRECT 139.4 05/12/2013   TRIG 80.0 06/12/2024   CHOLHDL 2 06/12/2024

## 2024-06-18 NOTE — Assessment & Plan Note (Signed)
Avoid antiinflammatory medication.  Stay hydrated.  Follow metabolic panel.

## 2024-06-25 ENCOUNTER — Encounter: Payer: Self-pay | Admitting: Internal Medicine

## 2024-07-17 ENCOUNTER — Ambulatory Visit (INDEPENDENT_AMBULATORY_CARE_PROVIDER_SITE_OTHER): Admitting: *Deleted

## 2024-07-17 ENCOUNTER — Telehealth: Payer: Self-pay | Admitting: *Deleted

## 2024-07-17 VITALS — Ht 61.0 in | Wt 112.0 lb

## 2024-07-17 DIAGNOSIS — Z0000 Encounter for general adult medical examination without abnormal findings: Secondary | ICD-10-CM

## 2024-07-17 NOTE — Telephone Encounter (Signed)
 Performed AWV  While reviewing cardiac risk factors patient stated that she does not have diabetes and had asked that this be taken off her records.  See care gap that shows diabetic eye exam overdue. Patient stated that she is up to date with her eye exams but does not have a diabetic exam.  Please update care gap tab accordingly.  Thanks

## 2024-07-17 NOTE — Telephone Encounter (Signed)
 Please call and explain to her that in reviewing - a few years ago, she had A1c 6.5-6.7. this is c/w diabetes. Sugars have been better recently, but still history of diabetes - (diet controlled)

## 2024-07-17 NOTE — Progress Notes (Signed)
 Subjective:   Kara Dixon is a 88 y.o. who presents for a Medicare Wellness preventive visit.  As a reminder, Annual Wellness Visits don't include a physical exam, and some assessments may be limited, especially if this visit is performed virtually. We may recommend an in-person follow-up visit with your provider if needed.  Visit Complete: Virtual I connected with  Kara Dixon on 07/17/24 by a audio enabled telemedicine application and verified that I am speaking with the correct person using two identifiers.  Patient Location: Home  Provider Location: Home Office  I discussed the limitations of evaluation and management by telemedicine. The patient expressed understanding and agreed to proceed.  Vital Signs: Because this visit was a virtual/telehealth visit, some criteria may be missing or patient reported. Any vitals not documented were not able to be obtained and vitals that have been documented are patient reported.  VideoDeclined- This patient declined Librarian, academic. Therefore the visit was completed with audio only.  Persons Participating in Visit: Patient assisted by Monta lin and caregiver.  AWV Questionnaire: No: Patient Medicare AWV questionnaire was not completed prior to this visit.  Cardiac Risk Factors include: advanced age (>59men, >48 women);dyslipidemia;hypertension     Objective:    Today's Vitals   07/17/24 1540  Weight: 112 lb (50.8 kg)  Height: 5' 1 (1.549 m)   Body mass index is 21.16 kg/m.     07/17/2024    3:58 PM 08/04/2023    3:15 PM 07/27/2022    2:09 PM 05/12/2021    1:54 PM 02/25/2021    7:40 AM 02/11/2021   10:25 AM 09/23/2018    9:57 AM  Advanced Directives  Does Patient Have a Medical Advance Directive? Yes Yes No No No No No   Type of Estate agent of [place];Living will Healthcare Power of [place];Living will       Does patient want to make changes to medical advance  directive?  No - Patient declined       Copy of Healthcare Power of Attorney in Chart? No - copy requested No - copy requested       Would patient like information on creating a medical advance directive?   No - Patient declined No - Patient declined No - Patient declined Yes (MAU/Ambulatory/Procedural Areas - Information given) No - Patient declined      Data saved with a previous flowsheet row definition    Current Medications (verified) Outpatient Encounter Medications as of 07/17/2024  Medication Sig   aspirin 81 MG tablet Take 81 mg by mouth daily.   atenolol  (TENORMIN ) 50 MG tablet TAKE 1 TABLET BY MOUTH EVERY DAY   carbidopa-levodopa (SINEMET IR) 25-100 MG tablet Take 1 tablet by mouth 3 (three) times daily.   Cholecalciferol (VITAMIN D3 SUPER STRENGTH) 50 MCG (2000 UT) TABS Take by mouth at bedtime.   lactase (LACTAID) 3000 UNITS tablet Take 1 tablet by mouth as needed.   meclizine  (ANTIVERT ) 25 MG tablet Take 25 mg by mouth 2 (two) times daily as needed.   ondansetron  (ZOFRAN -ODT) 4 MG disintegrating tablet Take 1 tablet (4 mg total) by mouth every 8 (eight) hours as needed for nausea or vomiting.   rosuvastatin  (CRESTOR ) 10 MG tablet TAKE 1 TABLET BY MOUTH EVERY DAY   sertraline  (ZOLOFT ) 50 MG tablet Take 1 tablet (50 mg total) by mouth daily.   Cyanocobalamin  (B-12 COMPLIANCE INJECTION) 1000 MCG/ML KIT Inject as directed. (Patient not taking: Reported on 07/17/2024)   No  facility-administered encounter medications on file as of 07/17/2024.    Allergies (verified) Avelox  [moxifloxacin  hcl in nacl], Mucinex [guaifenesin er], Penicillins, Sulfa antibiotics, Lotemax [loteprednol], and Maxitrol [neomycin-polymyxin-dexameth]   History: Past Medical History:  Diagnosis Date   Cancer (HCC)    skin   CKD (chronic kidney disease), stage III (HCC)    Hypercholesterolemia    Hypertension    Nephrolithiasis    Osteopenia    Parkinson's disease (HCC)    Type 2 diabetes mellitus  (HCC)    Vertigo    Vitamin D  deficiency    Past Surgical History:  Procedure Laterality Date   BREAST BIOPSY Left 12/10/2022   LEFT stereo bx, calcs, COIL clip-path pending   BREAST BIOPSY Left 12/10/2022   MM LT BREAST BX W LOC DEV 1ST LESION IMAGE BX SPEC STEREO GUIDE 12/10/2022 [facility]   CATARACT EXTRACTION W/PHACO Right 02/11/2021   Procedure: CATARACT EXTRACTION PHACO AND INTRAOCULAR LENS PLACEMENT (IOC) RIGHT;  Surgeon: Jaye Fallow, MD;  Location: [facility];  Service: Ophthalmology;  Laterality: Right;  CDE15.19 01:16.1 minutes   CATARACT EXTRACTION W/PHACO Left 02/25/2021   Procedure: CATARACT EXTRACTION PHACO AND INTRAOCULAR LENS PLACEMENT (IOC) LEFT;  Surgeon: Jaye Fallow, MD;  Location: [facility];  Service: Ophthalmology;  Laterality: Left;  18.25 01:23.4   cyst removed under tongue  age 34   ECTROPION REPAIR Right 01/22/2023   Procedure: ECTROPION REPAIR, TARSAL WEDGE ECTROPION REPAIR, EXTENSIVE RIGHT LOWER LID;  Surgeon: Ashley Greig HERO, MD;  Location: [facility];  Service: Ophthalmology;  Laterality: Right;   EYE SURGERY  02/2017   EYE LID; care everywhere   skin cancer removed  2024   nose   Family History  Problem Relation Age of Onset   Stroke Mother    Hypertension Mother    Other Father 20   Fibromyalgia Sister    Breast cancer Neg Hx    Colon cancer Neg Hx    Social History   Socioeconomic History   Marital status: Widowed    Spouse name: Not on file   Number of children: 1   Years of education: Not on file   Highest education level: Associate degree: academic program  Occupational History   Occupation: retired  Tobacco Use   Smoking status: Never   Smokeless tobacco: Never  Vaping Use   Vaping status: Never Used  Substance and Sexual Activity   Alcohol use: No    Alcohol/week: 0.0 standard drinks of alcohol   Drug use: No   Sexual activity: Not Currently  Other Topics Concern   Not on file   Social History Narrative   Husband deceased 2022-10-14   Monta, neighbor helps patient with daily activities and transportation   Social Drivers of Health   Financial Resource Strain: Low Risk  (07/17/2024)   Overall Financial Resource Strain (CARDIA)    Difficulty of Paying Living Expenses: Not hard at all  Food Insecurity: No Food Insecurity (07/17/2024)   Hunger Vital Sign    Worried About Running Out of Food in the Last Year: Never true    Ran Out of Food in the Last Year: Never true  Transportation Needs: No Transportation Needs (07/17/2024)   PRAPARE - Administrator, Civil Service (Medical): No    Lack of Transportation (Non-Medical): No  Physical Activity: Inactive (07/17/2024)   Exercise Vital Sign    Days of Exercise per Week: 0 days    Minutes of Exercise per Session: 0 min  Stress:  No Stress Concern Present (07/17/2024)   [employer] of Occupational Health - Occupational Stress Questionnaire    Feeling of Stress: Not at all  Social Connections: Moderately Isolated (07/17/2024)   Social Connection and Isolation Panel    Frequency of Communication with Friends and Family: More than three times a week    Frequency of Social Gatherings with Friends and Family: More than three times a week    Attends Religious Services: More than 4 times per year    Active Member of [employer] or Organizations: No    Attends Banker Meetings: Never    Marital Status: Widowed    Tobacco Counseling Counseling given: Not Answered    Clinical Intake:  Pre-visit preparation completed: Yes  Pain : No/denies pain     BMI - recorded: 21.16 Nutritional Status: BMI of 19-24  Normal Nutritional Risks: None Diabetes: No  Lab Results  Component Value Date   HGBA1C 6.2 06/12/2024   HGBA1C 6.1 12/31/2023   HGBA1C 6.0 09/23/2023     How often do you need to have someone help you when you read instructions, pamphlets, or other written materials from your  doctor or pharmacy?: 1 - Never  Interpreter Needed?: No  Information entered by :: R. Jaclyne Haverstick LPN   Activities of Daily Living     07/17/2024    3:42 PM 07/31/2023   10:21 AM  In your present state of health, do you have any difficulty performing the following activities:  Hearing? 0 0   Vision? 0 0   Difficulty concentrating or making decisions? 0 0   Walking or climbing stairs? 0 0   Dressing or bathing? 0 1  Comment  needs assistance  Doing errands, shopping? 1 1  Comment  Darlene, neighbor takes to appts and Psychologist, clinical and eating ? N N   Using the Toilet? N N   In the past six months, have you accidently leaked urine? Y N   Do you have problems with loss of bowel control? N N   Managing your Medications? N Y  Comment  Darlene, neighbor fills pill box  Managing your Finances? N N   Housekeeping or managing your Housekeeping? N Y  Comment  needs help with house cleaning     Proxy-reported    Patient Care Team: Glendia Shad, MD as PCP - General (Internal Medicine) Jaye Fallow, MD as Referring Physician (Ophthalmology) Maree Jannett POUR, MD as Consulting Physician (Neurology) Gregorio Adine MATSU, MD as Referring Physician (Dermatology) Milissa Hamming, MD as Referring Physician (Otolaryngology) Dermatology, [place] (Dermatology)  I have updated your Care Teams any recent Medical Services you may have received from other providers in the past year.     Assessment:   This is a routine wellness examination for Kara Dixon.  Hearing/Vision screen Hearing Screening - Comments:: Some issues, no aids Vision Screening - Comments:: glasses   Goals Addressed             This Visit's Progress    Patient Stated       Wants to work on getting her teeth fixed        Depression Screen     07/17/2024    3:51 PM 06/12/2024    3:33 PM 08/04/2023    3:11 PM 07/27/2022    2:06 PM 03/11/2022   10:04 AM 12/29/2021    7:27 AM 05/12/2021    1:49 PM  PHQ 2/9  Scores  PHQ - 2 Score 0 0 0  0 0 5 0  PHQ- 9 Score 1  0   10     Fall Risk     07/17/2024    3:45 PM 06/12/2024    3:33 PM 07/31/2023   10:21 AM 07/27/2022    1:57 PM 03/11/2022   10:03 AM  Fall Risk   Falls in the past year? 1 0 1  1 1   Number falls in past yr: 1 0 0  0 1  Injury with Fall? 0 0 1 0 1  Risk for fall due to : History of fall(s);Impaired balance/gait No Fall Risks History of fall(s);Impaired balance/gait;Orthopedic patient;Impaired mobility -- History of fall(s);Impaired balance/gait;Impaired vision  Risk for fall due to: Comment    Hx of vertigo   Follow up Falls evaluation completed;Falls prevention discussed Falls evaluation completed Education provided;Falls prevention discussed;Falls evaluation completed Falls evaluation completed  Falls evaluation completed      Proxy-reported   Data saved with a previous flowsheet row definition    MEDICARE RISK AT HOME:  Medicare Risk at Home Any stairs in or around the home?: Yes If so, are there any without handrails?: No Home free of loose throw rugs in walkways, pet beds, electrical cords, etc?: Yes Adequate lighting in your home to reduce risk of falls?: Yes Life alert?: No Use of a cane, walker or w/c?: Yes Grab bars in the bathroom?: Yes Shower chair or bench in shower?: Yes Elevated toilet seat or a handicapped toilet?: No  TIMED UP AND GO:  Was the test performed?  No  Cognitive Function: 6CIT completed        07/17/2024    4:01 PM 08/04/2023    3:19 PM 07/27/2022    2:27 PM 05/12/2021    1:51 PM 09/23/2018   10:00 AM  6CIT Screen  What Year? 0 points 0 points 0 points 0 points 0 points  What month? 0 points 0 points 0 points 0 points 0 points  What time? 0 points 0 points 0 points 0 points 0 points  Count back from 20 4 points 2 points 2 points 0 points 0 points  Months in reverse 4 points 4 points 4 points 0 points 0 points  Repeat phrase 0 points 2 points   0 points  Total Score 8 points 8 points    0 points    Immunizations Immunization History  Administered Date(s) Administered   Fluad Quad(high Dose 65+) 09/11/2019, 08/20/2020, 10/23/2021   Fluad Trivalent(High Dose 65+) 07/21/2023   INFLUENZA, HIGH DOSE SEASONAL PF 10/30/2016, 09/10/2017, 09/06/2018, 08/05/2022, 06/12/2024   Influenza Split 07/10/2013   Influenza,inj,Quad PF,6+ Mos 09/10/2014, 09/18/2015   Influenza-Unspecified 07/25/2012, 07/11/2013   Moderna SARS-COV2 Booster Vaccination 08/05/2022   Moderna Sars-Covid-2 Vaccination 04/25/2020, 05/23/2020, 11/27/2020   Pfizer(Comirnaty)Fall Seasonal Vaccine 12 years and older 08/17/2022   Pneumococcal Conjugate-13 03/09/2017   Pneumococcal Polysaccharide-23 05/18/2018   Zoster Recombinant(Shingrix) 07/05/2022, 11/23/2022    Screening Tests Health Maintenance  Topic Date Due   DTaP/Tdap/Td (1 - Tdap) Never done   OPHTHALMOLOGY EXAM  12/12/2022   COVID-19 Vaccine (4 - 2025-26 season) 06/05/2024   Medicare Annual Wellness (AWV)  08/03/2024   FOOT EXAM  12/05/2024   HEMOGLOBIN A1C  12/10/2024   Mammogram  12/15/2024   Pneumococcal Vaccine: 50+ Years  Completed   Influenza Vaccine  Completed   DEXA SCAN  Completed   Zoster Vaccines- Shingrix  Completed   Meningococcal B Vaccine  Aged Out    Health Maintenance Items Addressed: Discussed the  need to update covid and tetanus (Tdap) vaccines.  Additional Screening:  Vision Screening: Recommended annual ophthalmology exams for early detection of glaucoma and other disorders of the eye. Is the patient up to date with their annual eye exam?  Yes  Who is the provider or what is the name of the office in which the patient attends annual eye exams?  [facility]  Dental Screening: Recommended annual dental exams for proper oral hygiene  Community Resource Referral / Chronic Care Management: CRR required this visit?  No   CCM required this visit?  No   Plan:    I have personally reviewed and noted the following in  the patient's chart:   Medical and social history Use of alcohol, tobacco or illicit drugs  Current medications and supplements including opioid prescriptions. Patient is not currently taking opioid prescriptions. Functional ability and status Nutritional status Physical activity Advanced directives List of other physicians Hospitalizations, surgeries, and ER visits in previous 12 months Vitals Screenings to include cognitive, depression, and falls Referrals and appointments  In addition, I have reviewed and discussed with patient certain preventive protocols, quality metrics, and best practice recommendations. A written personalized care plan for preventive services as well as general preventive health recommendations were provided to patient.   Angeline Fredericks, LPN   [id number]   After Visit Summary: (MyChart) Due to this being a telephonic visit, the after visit summary with patients personalized plan was offered to patient via MyChart   Notes: Nothing significant to report at this time.  Phone note sent to PCP

## 2024-07-17 NOTE — Patient Instructions (Signed)
 Kara Dixon,  Thank you for taking the time for your Medicare Wellness Visit. I appreciate your continued commitment to your health goals. Please review the care plan we discussed, and feel free to reach out if I can assist you further.  Medicare recommends these wellness visits once per year to help you and your care team stay ahead of potential health issues. These visits are designed to focus on prevention, allowing your provider to concentrate on managing your acute and chronic conditions during your regular appointments.  Please note that Annual Wellness Visits do not include a physical exam. Some assessments may be limited, especially if the visit was conducted virtually. If needed, we may recommend a separate in-person follow-up with your provider.  Ongoing Care Seeing your primary care provider every 3 to 6 months helps us  monitor your health and provide consistent, personalized care.  Remember to get your covid and tetanus (Tdap) vaccines.  Referrals If a referral was made during today's visit and you haven't received any updates within two weeks, please contact the referred provider directly to check on the status.  Recommended Screenings:  Health Maintenance  Topic Date Due   DTaP/Tdap/Td vaccine (1 - Tdap) Never done   Eye exam for diabetics  12/12/2022   COVID-19 Vaccine (4 - 2025-26 season) 06/05/2024   Complete foot exam   12/05/2024   Hemoglobin A1C  12/10/2024   Breast Cancer Screening  12/15/2024   Medicare Annual Wellness Visit  07/17/2025   Pneumococcal Vaccine for age over 21  Completed   Flu Shot  Completed   DEXA scan (bone density measurement)  Completed   Zoster (Shingles) Vaccine  Completed   Meningitis B Vaccine  Aged Out       07/17/2024    3:58 PM  Advanced Directives  Does Patient Have a Medical Advance Directive? Yes  Type of Estate agent of [place];Living will  Copy of Healthcare Power of Attorney in Chart? No - copy  requested   Advance Care Planning is important because it: Ensures you receive medical care that aligns with your values, goals, and preferences. Provides guidance to your family and loved ones, reducing the emotional burden of decision-making during critical moments.  Vision: Annual vision screenings are recommended for early detection of glaucoma, cataracts, and diabetic retinopathy. These exams can also reveal signs of chronic conditions such as diabetes and high blood pressure.  Dental: Annual dental screenings help detect early signs of oral cancer, gum disease, and other conditions linked to overall health, including heart disease and diabetes.  Please see the attached documents for additional preventive care recommendations.

## 2024-07-18 NOTE — Telephone Encounter (Signed)
 Patients neighbor is aware and gave verbal understanding, advised to have patient call if anything further is needed or if any further questions

## 2024-07-26 ENCOUNTER — Other Ambulatory Visit: Payer: Self-pay | Admitting: Internal Medicine

## 2024-08-04 ENCOUNTER — Encounter: Payer: Self-pay | Admitting: Internal Medicine

## 2024-08-07 NOTE — Telephone Encounter (Signed)
 I do not mind placing an order, but I think this requires a face to face - for us  to place the order.  See if agreeable to schedule an appt

## 2024-08-08 ENCOUNTER — Ambulatory Visit: Payer: Medicare Other

## 2024-09-06 ENCOUNTER — Other Ambulatory Visit: Payer: Self-pay | Admitting: Internal Medicine

## 2024-09-06 DIAGNOSIS — R42 Dizziness and giddiness: Secondary | ICD-10-CM

## 2024-10-11 ENCOUNTER — Ambulatory Visit: Admitting: Internal Medicine

## 2024-10-11 ENCOUNTER — Encounter: Payer: Self-pay | Admitting: Internal Medicine

## 2024-10-11 VITALS — BP 136/70 | HR 57 | Temp 98.6°F | Ht 61.0 in | Wt 112.6 lb

## 2024-10-11 DIAGNOSIS — E1165 Type 2 diabetes mellitus with hyperglycemia: Secondary | ICD-10-CM | POA: Diagnosis not present

## 2024-10-11 DIAGNOSIS — Z1231 Encounter for screening mammogram for malignant neoplasm of breast: Secondary | ICD-10-CM

## 2024-10-11 DIAGNOSIS — N1831 Chronic kidney disease, stage 3a: Secondary | ICD-10-CM | POA: Diagnosis not present

## 2024-10-11 DIAGNOSIS — G20C Parkinsonism, unspecified: Secondary | ICD-10-CM | POA: Diagnosis not present

## 2024-10-11 DIAGNOSIS — R634 Abnormal weight loss: Secondary | ICD-10-CM

## 2024-10-11 DIAGNOSIS — E538 Deficiency of other specified B group vitamins: Secondary | ICD-10-CM | POA: Diagnosis not present

## 2024-10-11 DIAGNOSIS — R2681 Unsteadiness on feet: Secondary | ICD-10-CM

## 2024-10-11 DIAGNOSIS — E7800 Pure hypercholesterolemia, unspecified: Secondary | ICD-10-CM

## 2024-10-11 DIAGNOSIS — R4189 Other symptoms and signs involving cognitive functions and awareness: Secondary | ICD-10-CM

## 2024-10-11 DIAGNOSIS — R739 Hyperglycemia, unspecified: Secondary | ICD-10-CM

## 2024-10-11 DIAGNOSIS — I10 Essential (primary) hypertension: Secondary | ICD-10-CM | POA: Diagnosis not present

## 2024-10-11 MED ORDER — SERTRALINE HCL 50 MG PO TABS: 50.0000 mg | ORAL_TABLET | Freq: Every day | ORAL | 1 refills | Status: AC

## 2024-10-11 NOTE — Progress Notes (Signed)
 "  Subjective:    Patient ID: Kara Dixon, female    DOB: Apr 27, 1936, 89 y.o.   MRN: [id number]  Patient here for  Chief Complaint  Patient presents with   Medical Management of Chronic Issues    HPI Here for a scheduled follow up - follow up regarding hypercholesterolemia, diabetes and hypertension. She is accompanied by her neighbor. History obtained from both of them. Remains on zoloft . Also follow up regarding her weight. Weight is stable. She reports she is eating. No vomiting. No bowel change. Had f/u with neurology 09/04/24 - f/u regarding parkinsonism with balance impairment. Continues on sinemet. Started on memantine to help with memory. Low vitamin B12. She does report feeling more unsteady with walking. May stumble occasionally. No recent falls. Discussed Home health PT. Agreeable. Also reports hard to get up and down. No chest pain. Breathing stable.    Past Medical History:  Diagnosis Date   Anxiety    Cancer (HCC)    skin   Cataract    CKD (chronic kidney disease), stage III (HCC)    GERD (gastroesophageal reflux disease)    Hypercholesterolemia    Hypertension    Nephrolithiasis    Neuromuscular disorder (HCC) 2023   Parkison   Osteopenia    Parkinson's disease (HCC)    Type 2 diabetes mellitus (HCC)    Vertigo    Vitamin D  deficiency    Past Surgical History:  Procedure Laterality Date   BREAST BIOPSY Left 12/10/2022   LEFT stereo bx, calcs, COIL clip-path pending   BREAST BIOPSY Left 12/10/2022   MM LT BREAST BX W LOC DEV 1ST LESION IMAGE BX SPEC STEREO GUIDE 12/10/2022 [facility]   CATARACT EXTRACTION W/PHACO Right 02/11/2021   Procedure: CATARACT EXTRACTION PHACO AND INTRAOCULAR LENS PLACEMENT (IOC) RIGHT;  Surgeon: Jaye Fallow, MD;  Location: [facility];  Service: Ophthalmology;  Laterality: Right;  CDE15.19 01:16.1 minutes   CATARACT EXTRACTION W/PHACO Left 02/25/2021   Procedure: CATARACT EXTRACTION PHACO AND INTRAOCULAR LENS  PLACEMENT (IOC) LEFT;  Surgeon: Jaye Fallow, MD;  Location: [facility];  Service: Ophthalmology;  Laterality: Left;  18.25 01:23.4   cyst removed under tongue  age 82   ECTROPION REPAIR Right 01/22/2023   Procedure: ECTROPION REPAIR, TARSAL WEDGE ECTROPION REPAIR, EXTENSIVE RIGHT LOWER LID;  Surgeon: Ashley Greig HERO, MD;  Location: [facility];  Service: Ophthalmology;  Laterality: Right;   EYE SURGERY  02/2017   EYE LID; care everywhere   skin cancer removed  2024   nose   Family History  Problem Relation Age of Onset   Stroke Mother    Hypertension Mother    Other Father 40   Fibromyalgia Sister    Breast cancer Neg Hx    Colon cancer Neg Hx    Social History   Socioeconomic History   Marital status: Widowed    Spouse name: Not on file   Number of children: 1   Years of education: Not on file   Highest education level: Associate degree: academic program  Occupational History   Occupation: retired  Tobacco Use   Smoking status: Never   Smokeless tobacco: Never  Vaping Use   Vaping status: Never Used  Substance and Sexual Activity   Alcohol use: No    Alcohol/week: 0.0 standard drinks of alcohol   Drug use: No   Sexual activity: Not Currently  Other Topics Concern   Not on file  Social History Narrative   Husband deceased Nov 07, 2022  Darlene, neighbor helps patient with daily activities and transportation   Social Drivers of Health   Tobacco Use: Low Risk (10/15/2024)   Patient History    Smoking Tobacco Use: Never    Smokeless Tobacco Use: Never    Passive Exposure: Not on file  Financial Resource Strain: Low Risk (10/04/2024)   Overall Financial Resource Strain (CARDIA)    Difficulty of Paying Living Expenses: Not very hard  Food Insecurity: No Food Insecurity (10/04/2024)   Epic    Worried About Programme Researcher, Broadcasting/film/video in the Last Year: Never true    Ran Out of Food in the Last Year: Never true  Transportation Needs: No Transportation Needs  (10/04/2024)   Epic    Lack of Transportation (Medical): No    Lack of Transportation (Non-Medical): No  Physical Activity: Insufficiently Active (10/04/2024)   Exercise Vital Sign    Days of Exercise per Week: 1 day    Minutes of Exercise per Session: 10 min  Stress: No Stress Concern Present (10/04/2024)   [employer] of Occupational Health - Occupational Stress Questionnaire    Feeling of Stress: Only a little  Social Connections: Socially Isolated (10/04/2024)   Social Connection and Isolation Panel    Frequency of Communication with Friends and Family: Once a week    Frequency of Social Gatherings with Friends and Family: Once a week    Attends Religious Services: Never    Database Administrator or Organizations: No    Attends Engineer, Structural: Not on file    Marital Status: Widowed  Depression (PHQ2-9): Low Risk (10/11/2024)   Depression (PHQ2-9)    PHQ-2 Score: 3  Alcohol Screen: Low Risk (07/17/2024)   Alcohol Screen    Last Alcohol Screening Score (AUDIT): 0  Housing: Low Risk (10/04/2024)   Epic    Unable to Pay for Housing in the Last Year: No    Number of Times Moved in the Last Year: 0    Homeless in the Last Year: No  Utilities: Not At Risk (07/17/2024)   Epic    Threatened with loss of utilities: No  Health Literacy: Adequate Health Literacy (07/17/2024)   B1300 Health Literacy    Frequency of need for help with medical instructions: Never     Review of Systems  Constitutional:  Negative for appetite change and unexpected weight change.  HENT:  Negative for congestion and sinus pressure.   Respiratory:  Negative for cough, chest tightness and shortness of breath.   Cardiovascular:  Negative for chest pain, palpitations and leg swelling.  Gastrointestinal:  Negative for abdominal pain, diarrhea, nausea and vomiting.  Genitourinary:  Negative for difficulty urinating and dysuria.  Musculoskeletal:  Negative for joint swelling and myalgias.        Unsteady gait.   Skin:  Negative for color change and rash.  Neurological:  Negative for dizziness and headaches.  Psychiatric/Behavioral:  Negative for agitation and dysphoric mood.        Objective:     BP 136/70   Pulse (!) 57   Temp 98.6 F (37 C) (Oral)   Ht 5' 1 (1.549 m)   Wt 112 lb 9.6 oz (51.1 kg)   SpO2 97%   BMI 21.28 kg/m  Wt Readings from Last 3 Encounters:  10/11/24 112 lb 9.6 oz (51.1 kg)  07/17/24 112 lb (50.8 kg)  06/12/24 110 lb 9.6 oz (50.2 kg)    Physical Exam Vitals reviewed.  Constitutional:  General: She is not in acute distress.    Appearance: Normal appearance.  HENT:     Head: Normocephalic and atraumatic.     Right Ear: External ear normal.     Left Ear: External ear normal.     Mouth/Throat:     Pharynx: No oropharyngeal exudate or posterior oropharyngeal erythema.  Eyes:     General: No scleral icterus.       Right eye: No discharge.        Left eye: No discharge.     Conjunctiva/sclera: Conjunctivae normal.  Neck:     Thyroid : No thyromegaly.  Cardiovascular:     Rate and Rhythm: Normal rate and regular rhythm.  Pulmonary:     Effort: No respiratory distress.     Breath sounds: Normal breath sounds. No wheezing.  Abdominal:     General: Bowel sounds are normal.     Palpations: Abdomen is soft.     Tenderness: There is no abdominal tenderness.  Musculoskeletal:        General: No swelling or tenderness.     Cervical back: Neck supple. No tenderness.     Comments: Needs assistance with standing and getting up on the table.   Lymphadenopathy:     Cervical: No cervical adenopathy.  Skin:    Findings: No erythema or rash.  Neurological:     Mental Status: She is alert.  Psychiatric:        Mood and Affect: Mood normal.        Behavior: Behavior normal.         Outpatient Encounter Medications as of 10/11/2024  Medication Sig   aspirin 81 MG tablet Take 81 mg by mouth daily.   atenolol  (TENORMIN ) 50 MG tablet  TAKE 1 TABLET BY MOUTH EVERY DAY   carbidopa-levodopa (SINEMET IR) 25-100 MG tablet Take 1 tablet by mouth 3 (three) times daily.   Cholecalciferol (VITAMIN D3 SUPER STRENGTH) 50 MCG (2000 UT) TABS Take by mouth at bedtime.   Cyanocobalamin  (B-12 COMPLIANCE INJECTION) 1000 MCG/ML KIT Inject as directed.   lactase (LACTAID) 3000 UNITS tablet Take 1 tablet by mouth as needed.   meclizine  (ANTIVERT ) 25 MG tablet Take 25 mg by mouth 2 (two) times daily as needed.   memantine (NAMENDA) 5 MG tablet Take 5 mg by mouth at bedtime.   ondansetron  (ZOFRAN -ODT) 4 MG disintegrating tablet Take 1 tablet (4 mg total) by mouth every 8 (eight) hours as needed for nausea or vomiting.   rosuvastatin  (CRESTOR ) 10 MG tablet TAKE 1 TABLET BY MOUTH EVERY DAY   sertraline  (ZOLOFT ) 50 MG tablet Take 1 tablet (50 mg total) by mouth daily.   [DISCONTINUED] sertraline  (ZOLOFT ) 50 MG tablet TAKE 1 TABLET BY MOUTH EVERY DAY   No facility-administered encounter medications on file as of 10/11/2024.     Lab Results  Component Value Date   WBC 9.2 06/12/2024   HGB 12.8 06/12/2024   HCT 38.1 06/12/2024   PLT 231.0 06/12/2024   GLUCOSE 167 (H) 10/11/2024   CHOL 111 10/11/2024   TRIG 99.0 10/11/2024   HDL 48.60 10/11/2024   LDLDIRECT 139.4 05/12/2013   LDLCALC 43 10/11/2024   ALT 4 10/11/2024   AST 11 10/11/2024   NA 141 10/11/2024   K 3.7 10/11/2024   CL 105 10/11/2024   CREATININE 1.14 10/11/2024   BUN 21 10/11/2024   CO2 31 10/11/2024   TSH 2.66 12/31/2023   HGBA1C 6.1 10/11/2024    MM 3D SCREENING MAMMOGRAM  BILATERAL BREAST Result Date: 12/20/2023 CLINICAL DATA:  Screening. EXAM: DIGITAL SCREENING BILATERAL MAMMOGRAM WITH TOMOSYNTHESIS AND CAD TECHNIQUE: Bilateral screening digital craniocaudal and mediolateral oblique mammograms were obtained. Bilateral screening digital breast tomosynthesis was performed. The images were evaluated with computer-aided detection. COMPARISON:  Previous exam(s). ACR Breast  Density Category b: There are scattered areas of fibroglandular density. FINDINGS: There are no findings suspicious for malignancy. IMPRESSION: No mammographic evidence of malignancy. A result letter of this screening mammogram will be mailed directly to the patient. RECOMMENDATION: Screening mammogram in one year. (Code:SM-B-01Y) BI-RADS CATEGORY  1: Negative. Electronically Signed   By: Dina  Arceo M.D.   On: 12/20/2023 16:09       Assessment & Plan:  Unsteady gait Assessment & Plan: Unsteady gait and weakness.  Discussed home health for PT. She is agreeable. Appears to have worsened. Place order for referral.     Orders: -     Ambulatory referral to Home Health  Hypercholesterolemia Assessment & Plan: Continue crestor . Follow lipid panel.  Lab Results  Component Value Date   CHOL 111 10/11/2024   HDL 48.60 10/11/2024   LDLCALC 43 10/11/2024   LDLDIRECT 139.4 05/12/2013   TRIG 99.0 10/11/2024   CHOLHDL 2 10/11/2024     Orders: -     Lipid panel -     Hepatic function panel  Hyperglycemia -     Hemoglobin A1c  Primary hypertension Assessment & Plan: Continue atenolol . Follow pressures. Follow metabolic panel.   Orders: -     Basic metabolic panel with GFR  Encounter for screening mammogram for malignant neoplasm of breast -     3D Screening Mammogram, Left and Right; Future  Weight loss Assessment & Plan: Weight stable from last check. Reports eating well. Follow.    Type 2 diabetes mellitus with hyperglycemia, without long-term current use of insulin (HCC) Assessment & Plan: Follow met b and A1c.  Lab Results  Component Value Date   HGBA1C 6.1 10/11/2024     Orders: -     Ambulatory referral to Home Health  Stage 3a chronic kidney disease [facility]) Assessment & Plan: Avoid antiinflammatory medication. Stay hydrated.  Follow metabolic panel.   Orders: -     Ambulatory referral to Home Health  Parkinsonism, unspecified Parkinsonism type [facility]) Assessment &  Plan: Had f/u with neurology 09/04/24 - f/u regarding parkinsonism with balance impairment. Continues on sinemet. Started on memantine to help with memory. Low vitamin B12.    Cognitive impairment Assessment & Plan: Had f/u with neurology 09/04/24 - f/u regarding parkinsonism with balance impairment. Continues on sinemet. Started on memantine to help with memory. Low vitamin B12.    B12 deficiency Assessment & Plan: Had f/u with neurology 09/04/24 - f/u regarding parkinsonism with balance impairment. Continues on sinemet. Started on memantine to help with memory. Low vitamin B12. B12 injections.    Other orders -     Sertraline  HCl; Take 1 tablet (50 mg total) by mouth daily.  Dispense: 90 tablet; Refill: 1     Allena Hamilton, MD "

## 2024-10-12 LAB — BASIC METABOLIC PANEL WITH GFR
BUN: 21 mg/dL (ref 6–23)
CO2: 31 meq/L (ref 19–32)
Calcium: 9.2 mg/dL (ref 8.4–10.5)
Chloride: 105 meq/L (ref 96–112)
Creatinine, Ser: 1.14 mg/dL (ref 0.40–1.20)
GFR: 43.61 mL/min — ABNORMAL LOW
Glucose, Bld: 167 mg/dL — ABNORMAL HIGH (ref 70–99)
Potassium: 3.7 meq/L (ref 3.5–5.1)
Sodium: 141 meq/L (ref 135–145)

## 2024-10-12 LAB — HEMOGLOBIN A1C: Hgb A1c MFr Bld: 6.1 % (ref 4.6–6.5)

## 2024-10-12 LAB — LIPID PANEL
Cholesterol: 111 mg/dL (ref 28–200)
HDL: 48.6 mg/dL
LDL Cholesterol: 43 mg/dL (ref 10–99)
NonHDL: 62.87
Total CHOL/HDL Ratio: 2
Triglycerides: 99 mg/dL (ref 10.0–149.0)
VLDL: 19.8 mg/dL (ref 0.0–40.0)

## 2024-10-12 LAB — HEPATIC FUNCTION PANEL
ALT: 4 U/L (ref 3–35)
AST: 11 U/L (ref 5–37)
Albumin: 4.1 g/dL (ref 3.5–5.2)
Alkaline Phosphatase: 80 U/L (ref 39–117)
Bilirubin, Direct: 0.1 mg/dL (ref 0.1–0.3)
Total Bilirubin: 0.3 mg/dL (ref 0.2–1.2)
Total Protein: 7.1 g/dL (ref 6.0–8.3)

## 2024-10-13 ENCOUNTER — Ambulatory Visit: Payer: Self-pay | Admitting: Internal Medicine

## 2024-10-13 ENCOUNTER — Ambulatory Visit: Admitting: Internal Medicine

## 2024-10-13 DIAGNOSIS — I10 Essential (primary) hypertension: Secondary | ICD-10-CM

## 2024-10-13 NOTE — Telephone Encounter (Unsigned)
 Copied from CRM [id number]. Topic: General - Other >> Oct 13, 2024 11:31 AM Pinkey ORN wrote: Reason for CRM: Returning Missed Office Call >> Oct 13, 2024 11:32 AM Pinkey ORN wrote: Patient was returning a missed office call. I informed the patient of the information as shown below:   Kara Shad, MD to [facility] Clinical     10/13/24  3:49 AM Result Note Please call Monta Spearman and notify - kidney function has decreased some from recent check. Has varied. Need to stay hydrated. We will need to follow. I would like to recheck met b in 4 weeks. Cholesterol levels look good. Overall sugar control stable. Liver function tests wnl.    Patient's follow up lab has been scheduled.

## 2024-10-15 ENCOUNTER — Encounter: Payer: Self-pay | Admitting: Internal Medicine

## 2024-10-15 DIAGNOSIS — E538 Deficiency of other specified B group vitamins: Secondary | ICD-10-CM | POA: Insufficient documentation

## 2024-10-15 DIAGNOSIS — R4189 Other symptoms and signs involving cognitive functions and awareness: Secondary | ICD-10-CM | POA: Insufficient documentation

## 2024-10-15 DIAGNOSIS — G20C Parkinsonism, unspecified: Secondary | ICD-10-CM | POA: Insufficient documentation

## 2024-10-15 NOTE — Assessment & Plan Note (Signed)
 Weight stable from last check. Reports eating well. Follow.

## 2024-10-15 NOTE — Assessment & Plan Note (Signed)
 Continue atenolol.  Follow pressures.  Follow metabolic panel.

## 2024-10-15 NOTE — Assessment & Plan Note (Signed)
 Follow met b and A1c.  Lab Results  Component Value Date   HGBA1C 6.1 10/11/2024

## 2024-10-15 NOTE — Assessment & Plan Note (Signed)
 Continue crestor . Follow lipid panel.  Lab Results  Component Value Date   CHOL 111 10/11/2024   HDL 48.60 10/11/2024   LDLCALC 43 10/11/2024   LDLDIRECT 139.4 05/12/2013   TRIG 99.0 10/11/2024   CHOLHDL 2 10/11/2024

## 2024-10-15 NOTE — Assessment & Plan Note (Signed)
Avoid antiinflammatory medication.  Stay hydrated.  Follow metabolic panel.

## 2024-10-15 NOTE — Assessment & Plan Note (Signed)
 Had f/u with neurology 09/04/24 - f/u regarding parkinsonism with balance impairment. Continues on sinemet. Started on memantine to help with memory. Low vitamin B12.

## 2024-10-15 NOTE — Assessment & Plan Note (Signed)
 Unsteady gait and weakness.  Discussed home health for PT. She is agreeable. Appears to have worsened. Place order for referral.

## 2024-10-15 NOTE — Assessment & Plan Note (Signed)
 Had f/u with neurology 09/04/24 - f/u regarding parkinsonism with balance impairment. Continues on sinemet. Started on memantine to help with memory. Low vitamin B12. B12 injections.

## 2024-10-16 ENCOUNTER — Telehealth: Payer: Self-pay

## 2024-10-16 NOTE — Telephone Encounter (Unsigned)
 Copied from CRM [id number]. Topic: Clinical - Medical Advice >> Oct 16, 2024  4:00 PM Nessti S wrote: Reason for CRM: called because caregiver knows about know about memory fluctuation, but would like pt to get further testing for dementia

## 2024-10-18 NOTE — Telephone Encounter (Signed)
 Lvm for Darlene to give the office a call back. Okay to relay once relayed please notify office.

## 2024-10-18 NOTE — Telephone Encounter (Unsigned)
 Copied from CRM [id number]. Topic: General - Other >> Oct 18, 2024  2:58 PM Thersia BROCKS wrote: Reason for CRM: Patient caregiver Monta called back, relayed message to her stated she understood and no further questions

## 2024-10-18 NOTE — Telephone Encounter (Signed)
 We can follow. Can hold on further evaluation if desire. Let us  know if any problems.

## 2024-10-23 ENCOUNTER — Telehealth: Payer: Self-pay

## 2024-10-23 NOTE — Telephone Encounter (Signed)
 Verbal orders given

## 2024-10-23 NOTE — Telephone Encounter (Signed)
 Copied from CRM [id number]. Topic: Clinical - Home Health Verbal Orders >> Oct 20, 2024  4:56 PM Chasity T wrote: Caller/Agency: [facility] Callback Number: [phone]  Service Requested: Physical Therapy Frequency: patient is wnating to move her pt to Monday evaluation on 01/19 and need verbal that is okay to move the visit. Any new concerns about the patient? No

## 2024-10-23 NOTE — Telephone Encounter (Signed)
 Are they just wanting an ok to change times - if so - ok to give verbal order.

## 2024-10-24 ENCOUNTER — Telehealth: Payer: Self-pay

## 2024-10-24 DIAGNOSIS — R4189 Other symptoms and signs involving cognitive functions and awareness: Secondary | ICD-10-CM

## 2024-10-24 DIAGNOSIS — G20C Parkinsonism, unspecified: Secondary | ICD-10-CM

## 2024-10-24 NOTE — Telephone Encounter (Signed)
 Order placed for VBCI referral.

## 2024-10-24 NOTE — Telephone Encounter (Signed)
 Noted. See if agreeable for referral for social work to see what resources are available. If so, ok to place referral.

## 2024-10-24 NOTE — Telephone Encounter (Signed)
 Spoke to pt's DPR Darlene. Stated that they would love to have a child psychotherapist. Referral pended

## 2024-10-24 NOTE — Telephone Encounter (Signed)
 Copied from CRM #[id number]. Topic: General - Other >> Oct 24, 2024  8:28 AM Olam RAMAN wrote: Reason for CRM:  [facility] [phone] need to report not admit per pt req. wants personal care services instead of home health

## 2024-10-27 ENCOUNTER — Telehealth: Payer: Self-pay

## 2024-10-27 NOTE — Progress Notes (Signed)
 Complex Care Management Note  Care Guide Note 10/27/2024 Name: Kara Dixon MRN: [id number] DOB: [birth year]  Kara Dixon is a 89 y.o. year old female who sees Glendia Shad, MD for primary care. I reached out to [name] by phone today to offer complex care management services.  [name] was given information about Complex Care Management services today including:   The Complex Care Management services include support from the care team which includes your Nurse Care Manager, Clinical Social Worker, or Pharmacist.  The Complex Care Management team is here to help remove barriers to the health concerns and goals most important to you. Complex Care Management services are voluntary, and the patient may decline or stop services at any time by request to their care team member.   Complex Care Management Consent Status: Patient agreed to services and verbal consent obtained.   Follow up plan:  Telephone appointment with complex care management team member scheduled for:  11/08/24 at 2:30 p.m.  Encounter Outcome:  Patient Scheduled  Dreama [facility]  [facility], [facility] VBCI Assistant Direct Dial: [phone]  Fax: [phone]

## 2024-10-27 NOTE — Progress Notes (Signed)
 Complex Care Management Note Care Guide Note  10/27/2024 Name: Kara Dixon MRN: [id number] DOB: 08-Mar-1936   Complex Care Management Outreach Attempts: An unsuccessful telephone outreach was attempted today to offer the patient information about available complex care management services.  Follow Up Plan:  Additional outreach attempts will be made to offer the patient complex care management information and services.   Encounter Outcome:  No Answer  Dreama [facility]  [facility], [facility] VBCI Assistant Direct Dial: [phone]  Fax: [phone]

## 2024-11-08 ENCOUNTER — Other Ambulatory Visit: Payer: Self-pay | Admitting: *Deleted

## 2024-11-09 ENCOUNTER — Telehealth: Payer: Self-pay | Admitting: Internal Medicine

## 2024-11-09 DIAGNOSIS — R2681 Unsteadiness on feet: Secondary | ICD-10-CM

## 2024-11-09 NOTE — Patient Outreach (Signed)
 Complex Care Management   Visit Note  11/09/2024  Name:  Kara Dixon MRN: [id number] DOB: Apr 13, 1936  Situation: Referral received for Complex Care Management related to community resource information I obtained verbal consent from Patient And neighbor Kara Dixon Visit completed with Patient And neighbor Kara Dixon  on the phone on 11/08/24  Background:   Past Medical History:  Diagnosis Date   Anxiety    Cancer (HCC)    skin   Cataract    CKD (chronic kidney disease), stage III (HCC)    GERD (gastroesophageal reflux disease)    Hypercholesterolemia    Hypertension    Nephrolithiasis    Neuromuscular disorder (HCC) 2023   Parkison   Osteopenia    Parkinson's disease (HCC)    Type 2 diabetes mellitus (HCC)    Vertigo    Vitamin D  deficiency     Assessment: Patient Reported Symptoms:  Cognitive Cognitive Status: Alert and oriented to person, place, and time, Normal speech and language skills Cognitive/Intellectual Conditions Management [RPT]: None reported or documented in medical history or problem list   Health Maintenance Behaviors: Annual physical exam Healing Pattern: Average Health Facilitated by: Rest, Healthy diet  Neurological Neurological Review of Symptoms: Dizziness, Hearing changes Neurological Management Strategies: Adequate rest, Medication therapy Neurological Self-Management Outcome: 4 (good) Neurological Comment: Followed by Neurology at [facility]  HEENT HEENT Symptoms Reported: Change or loss of hearing (has not had hearing tested- will schedule appointment)      Cardiovascular Cardiovascular Symptoms Reported: No symptoms reported    Respiratory Respiratory Symptoms Reported: No symptoms reported    Endocrine Endocrine Symptoms Reported: No symptoms reported Is patient diabetic?: Yes (patient's A1C was high one time but has had not further problems does not check blood sugar) Is patient checking blood sugars at home?: No    Gastrointestinal  Gastrointestinal Symptoms Reported: No symptoms reported      Genitourinary Genitourinary Symptoms Reported: Urgency Additional Genitourinary Details: incontinence occassionally Genitourinary Management Strategies: Incontinence garment/pad  Integumentary Integumentary Symptoms Reported: Skin changes Additional Integumentary Details: age spots    Musculoskeletal Musculoskelatal Symptoms Reviewed: Limited mobility Additional Musculoskeletal Details: Has cain and walker but does not use often-open to HHPT for strength and endurance Musculoskeletal Management Strategies: Adequate rest, Coping strategies Falls in the past year?: No Number of falls in past year: 1 or less Was there an injury with Fall?: No Fall Risk Category Calculator: 0 Patient Fall Risk Level: Low Fall Risk    Psychosocial Psychosocial Symptoms Reported: Anxiety - if selected complete GAD Additional Psychological Details: Anxiety comes and goes-get anxious sometimes,-feels frustrated if she can't remember something or can't get a word out , tomorrow is her wedding anniversary-husband has passed-denies need for ongoing mental health support Behavioral Management Strategies: Coping strategies Techniques to Cope with Loss/Stress/Change: Medication Quality of Family Relationships: supportive Do you feel physically threatened by others?: No    11/09/2024    PHQ2-9 Depression Screening   Little interest or pleasure in doing things Not at all  Feeling down, depressed, or hopeless Not at all  PHQ-2 - Total Score 0  Trouble falling or staying asleep, or sleeping too much    Feeling tired or having little energy    Poor appetite or overeating     Feeling bad about yourself - or that you are a failure or have let yourself or your family down    Trouble concentrating on things, such as reading the newspaper or watching television    Moving or speaking so slowly  that other people could have noticed.  Or the opposite - being so  fidgety or restless that you have been moving around a lot more than usual    Thoughts that you would be better off dead, or hurting yourself in some way    PHQ2-9 Total Score    If you checked off any problems, how difficult have these problems made it for you to do your work, take care of things at home, or get along with other people    Depression Interventions/Treatment      There were no vitals filed for this visit. Pain Scale: 0-10 Pain Score: 0-No pain  Medications Reviewed Today     Reviewed by Ermalinda Lenn HERO, LCSW (Social Worker) on 11/08/24 at 1553  Med List Status: <None>   Medication Order Taking? Sig Documenting Provider Last Dose Status Informant  aspirin 81 MG tablet [id number] Yes Take 81 mg by mouth daily. [provider]  Active   atenolol  (TENORMIN ) 50 MG tablet [id number] Yes TAKE 1 TABLET BY MOUTH EVERY DAY Glendia Shad, MD  Active   carbidopa-levodopa (SINEMET IR) 25-100 MG tablet [id number] Yes Take 1 tablet by mouth 3 (three) times daily. [provider]  Active   Cholecalciferol (VITAMIN D3 SUPER STRENGTH) 50 MCG (2000 UT) TABS [id number] Yes Take by mouth at bedtime. [provider]  Active   Cyanocobalamin  (B-12 COMPLIANCE INJECTION) 1000 MCG/ML KIT [id number] Yes Inject as directed. [provider]  Active   lactase (LACTAID) 3000 UNITS tablet [id number] Yes Take 1 tablet by mouth as needed. [provider]  Active   meclizine  (ANTIVERT ) 25 MG tablet [id number] Yes Take 25 mg by mouth 2 (two) times daily as needed. [provider]  Active   memantine (NAMENDA) 5 MG tablet [id number] Yes Take 5 mg by mouth at bedtime. [provider]  Active   ondansetron  (ZOFRAN -ODT) 4 MG disintegrating tablet [id number] Yes Take 1 tablet (4 mg total) by mouth every 8 (eight) hours as needed for nausea or vomiting. Rodriguez-Southworth, Kyra, PA-C  Active   rosuvastatin  (CRESTOR ) 10 MG tablet [id number] Yes TAKE 1 TABLET BY  MOUTH EVERY DAY Glendia Shad, MD  Active   sertraline  (ZOLOFT ) 50 MG tablet [id number] Yes Take 1 tablet (50 mg total) by mouth daily. Glendia Shad, MD  Active             Recommendation:   PCP Follow-up Specialty provider follow-up as scheduled  Follow Up Plan:   Telephone follow up appointment date/time:  11/16/24 3pm  Sue Mcalexander, LCSW [place]  [facility], [facility] Licensed Clinical Social Worker  Direct Dial: [phone]

## 2024-11-09 NOTE — Telephone Encounter (Signed)
-----   Message from Wellford Webster sent at 11/09/2024  5:40 PM EST ----- Hello Dr. Glendia  I spoke with Ms. Feighner yesterday and she is interested in [facility] PT. She declined it in the past but is now interested. Would it be possible to to get orders again for [facility] PT for her?  Thanks so much!  Chrystal Land, LCSW [place]  [facility], [facility] Licensed Clinical Social Worker  Direct Dial: [phone]

## 2024-11-09 NOTE — Telephone Encounter (Signed)
 Reviewed attached. Request HH PT. Order placed for home health PT. Please notify pts neighbor and caretaker - Monta Spearman that someone should be contacting - to set date and time to come out for evaluation.

## 2024-11-09 NOTE — Patient Instructions (Signed)
 Visit Information  Thank you for taking time to visit with me today. Please don't hesitate to contact me if I can be of assistance to you before our next scheduled appointment.  Our next appointment is by telephone on 11/16/24 at 3pm Please call the care guide team at [phone] if you need to cancel or reschedule your appointment.   Following is a copy of your care plan:   Goals Addressed             This Visit's Progress    VBCI Social Work Care Plan       Problems:   Lacks knowledge of how to connect   CSW Clinical Goal(s):   Over the next 90 days the Patient  will follow up with [facility]  as directed by Social Work to update hearing test for referral to the [facility] for hearing aid assistance as evidenced by patient report             Over the next 30 days patient will follow up with provider regarding status of HHPT orders as evidenced by patient report  Interventions:  Level of Care Concerns in a patient with Parkinsons Current level of care: home, alone Evaluation of patient's unmet needs in current living environment ADL's Assessed needs, level of care concerns, how currently meeting needs and barriers to care Confirmed that patient lives alone, however neighbor and SIL provide assistance related to her daily care needs Increasing socialization discussed-patient declined options for [employer] or [employer] activities Patient confirmed interest in HHPT-order requested by provider Patient interested in hearing aid program through the State-agrees to schedule appointment for an updated hearing test-hearing test results to be forwarded to the [facility] for hearing aid assistance Med Alert program brochure to be provided my mail for patient to consider for safety Consider automated medication box for increase adherance  Patient Goals/Self-Care Activities:  Continue taking your medication as prescribed.   Call  ENT to schedule hearing test for hearing aid  program Review med alert brochure once received Follow up with status of HHPT once orders are received  Plan:   Telephone follow up appointment with care management team member scheduled for:  11/16/24        Please call the Suicide and Crisis Lifeline: 988 call the [place]  National Suicide Prevention Lifeline: 249-386-3438 or TTY: 2368875937 TTY 904-633-7076) to talk to a trained counselor call 1-800-273-TALK (toll free, 24 hour hotline) call 911 if you are experiencing a Mental Health or Behavioral Health Crisis or need someone to talk to.  Patient verbalized understanding of Care plan and visit instructions communicated this visit  Ainsley Sanguinetti, LCSW [place]  [facility], [facility] Licensed Clinical Social Worker  Direct Dial: [phone]

## 2024-11-10 ENCOUNTER — Other Ambulatory Visit

## 2024-11-10 DIAGNOSIS — I10 Essential (primary) hypertension: Secondary | ICD-10-CM

## 2024-11-10 NOTE — Telephone Encounter (Signed)
 Darlene notified.

## 2024-11-15 ENCOUNTER — Telehealth: Admitting: *Deleted

## 2024-11-16 ENCOUNTER — Telehealth: Admitting: *Deleted

## 2024-12-21 ENCOUNTER — Ambulatory Visit

## 2025-02-12 ENCOUNTER — Ambulatory Visit: Admitting: Internal Medicine

## 2025-07-23 ENCOUNTER — Ambulatory Visit
